# Patient Record
Sex: Female | Born: 1950
Health system: Southern US, Community
[De-identification: ages and names within clinical notes are randomized; demographics above are authoritative.]

## PROBLEM LIST (undated history)

## (undated) DIAGNOSIS — Z8719 Personal history of other diseases of the digestive system: Secondary | ICD-10-CM

## (undated) DIAGNOSIS — E785 Hyperlipidemia, unspecified: Secondary | ICD-10-CM

## (undated) DIAGNOSIS — R162 Hepatomegaly with splenomegaly, not elsewhere classified: Secondary | ICD-10-CM

## (undated) DIAGNOSIS — F32A Depression, unspecified: Secondary | ICD-10-CM

## (undated) DIAGNOSIS — F329 Major depressive disorder, single episode, unspecified: Secondary | ICD-10-CM

## (undated) DIAGNOSIS — K219 Gastro-esophageal reflux disease without esophagitis: Secondary | ICD-10-CM

## (undated) DIAGNOSIS — E119 Type 2 diabetes mellitus without complications: Secondary | ICD-10-CM

## (undated) DIAGNOSIS — K5790 Diverticulosis of intestine, part unspecified, without perforation or abscess without bleeding: Secondary | ICD-10-CM

## (undated) DIAGNOSIS — Z86010 Personal history of colonic polyps: Secondary | ICD-10-CM

## (undated) DIAGNOSIS — F419 Anxiety disorder, unspecified: Secondary | ICD-10-CM

## (undated) DIAGNOSIS — Z9889 Other specified postprocedural states: Secondary | ICD-10-CM

## (undated) DIAGNOSIS — K220 Achalasia of cardia: Secondary | ICD-10-CM

## (undated) HISTORY — DX: Diverticulosis of intestine, part unspecified, without perforation or abscess without bleeding: K57.90

## (undated) HISTORY — DX: Achalasia of cardia: K22.0

## (undated) HISTORY — PX: LAPAROSCOPIC MYOTOMY: SUR779

## (undated) HISTORY — DX: Anxiety disorder, unspecified: F41.9

## (undated) HISTORY — PX: COLONOSCOPY: SHX5424

## (undated) HISTORY — DX: Type 2 diabetes mellitus without complications: E11.9

## (undated) HISTORY — DX: Personal history of other diseases of the digestive system: Z87.19

## (undated) HISTORY — DX: Depression, unspecified: F32.A

## (undated) HISTORY — DX: Hyperlipidemia, unspecified: E78.5

## (undated) HISTORY — DX: Gastro-esophageal reflux disease without esophagitis: K21.9

## (undated) HISTORY — DX: Hepatomegaly with splenomegaly, not elsewhere classified: R16.2

## (undated) HISTORY — DX: Major depressive disorder, single episode, unspecified: F32.9

## (undated) HISTORY — PX: ESOPHAGOGASTRODUODENOSCOPY: SHX1529

## (undated) HISTORY — DX: Personal history of other diseases of the digestive system: Z98.890

---
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
## 2002-12-04 ENCOUNTER — Encounter: Payer: Self-pay | Admitting: Gynecology

## 2002-12-04 ENCOUNTER — Ambulatory Visit (HOSPITAL_COMMUNITY): Admission: RE | Admit: 2002-12-04 | Discharge: 2002-12-04 | Payer: Self-pay | Admitting: Gynecology

## 2003-07-09 ENCOUNTER — Encounter: Admission: RE | Admit: 2003-07-09 | Discharge: 2003-07-09 | Payer: Self-pay | Admitting: Gynecology

## 2003-07-09 ENCOUNTER — Encounter: Payer: Self-pay | Admitting: Gynecology

## 2003-07-27 ENCOUNTER — Encounter: Payer: Self-pay | Admitting: General Surgery

## 2003-07-27 ENCOUNTER — Ambulatory Visit (HOSPITAL_COMMUNITY): Admission: RE | Admit: 2003-07-27 | Discharge: 2003-07-27 | Payer: Self-pay | Admitting: General Surgery

## 2004-02-11 ENCOUNTER — Other Ambulatory Visit: Admission: RE | Admit: 2004-02-11 | Discharge: 2004-02-11 | Payer: Self-pay | Admitting: Gynecology

## 2004-02-23 ENCOUNTER — Encounter: Admission: RE | Admit: 2004-02-23 | Discharge: 2004-02-23 | Payer: Self-pay | Admitting: Gynecology

## 2005-01-03 ENCOUNTER — Ambulatory Visit: Payer: Self-pay | Admitting: Family Medicine

## 2005-02-13 ENCOUNTER — Other Ambulatory Visit: Admission: RE | Admit: 2005-02-13 | Discharge: 2005-02-13 | Payer: Self-pay | Admitting: Gynecology

## 2005-03-20 ENCOUNTER — Encounter: Admission: RE | Admit: 2005-03-20 | Discharge: 2005-03-20 | Payer: Self-pay | Admitting: Gynecology

## 2006-03-05 ENCOUNTER — Other Ambulatory Visit: Admission: RE | Admit: 2006-03-05 | Discharge: 2006-03-05 | Payer: Self-pay | Admitting: Gynecology

## 2006-05-04 ENCOUNTER — Ambulatory Visit: Payer: Self-pay | Admitting: Family Medicine

## 2006-09-24 ENCOUNTER — Ambulatory Visit: Payer: Self-pay | Admitting: Gastroenterology

## 2006-09-24 LAB — CONVERTED CEMR LAB
Basophils Absolute: 0 10*3/uL (ref 0.0–0.1)
Bilirubin Urine: NEGATIVE
HCT: 41.5 % (ref 36.0–46.0)
Hemoglobin, Urine: NEGATIVE
Monocytes Absolute: 0.5 10*3/uL (ref 0.2–0.7)
Monocytes Relative: 7.1 % (ref 3.0–11.0)
Neutro Abs: 4.8 10*3/uL (ref 1.4–7.7)
Nitrite: NEGATIVE
Platelets: 252 10*3/uL (ref 150–400)
RBC: 4.78 M/uL (ref 3.87–5.11)
RDW: 11.8 % (ref 11.5–14.6)
Specific Gravity, Urine: >=1.03 (ref 1.000–1.03)

## 2006-10-04 ENCOUNTER — Encounter (INDEPENDENT_AMBULATORY_CARE_PROVIDER_SITE_OTHER): Payer: Self-pay | Admitting: *Deleted

## 2006-10-04 ENCOUNTER — Ambulatory Visit: Payer: Self-pay | Admitting: Gastroenterology

## 2006-10-04 LAB — CONVERTED CEMR LAB
OCCULT 3: NEGATIVE
OCCULT 4: NEGATIVE

## 2006-11-14 ENCOUNTER — Encounter: Admission: RE | Admit: 2006-11-14 | Discharge: 2006-11-14 | Payer: Self-pay | Admitting: Gynecology

## 2006-12-05 ENCOUNTER — Encounter: Admission: RE | Admit: 2006-12-05 | Discharge: 2006-12-05 | Payer: Self-pay | Admitting: Gynecology

## 2007-03-07 ENCOUNTER — Emergency Department (HOSPITAL_COMMUNITY): Admission: EM | Admit: 2007-03-07 | Discharge: 2007-03-07 | Payer: Self-pay | Admitting: Emergency Medicine

## 2007-03-07 DIAGNOSIS — K5730 Diverticulosis of large intestine without perforation or abscess without bleeding: Secondary | ICD-10-CM | POA: Insufficient documentation

## 2007-03-07 DIAGNOSIS — K219 Gastro-esophageal reflux disease without esophagitis: Secondary | ICD-10-CM | POA: Insufficient documentation

## 2007-03-07 DIAGNOSIS — Z1211 Encounter for screening for malignant neoplasm of colon: Secondary | ICD-10-CM | POA: Insufficient documentation

## 2007-03-07 DIAGNOSIS — Z9889 Other specified postprocedural states: Secondary | ICD-10-CM

## 2007-03-15 ENCOUNTER — Encounter: Admission: RE | Admit: 2007-03-15 | Discharge: 2007-03-15 | Payer: Self-pay | Admitting: Gynecology

## 2007-03-28 ENCOUNTER — Encounter: Admission: RE | Admit: 2007-03-28 | Discharge: 2007-03-28 | Payer: Self-pay | Admitting: Gynecology

## 2007-03-28 ENCOUNTER — Encounter (INDEPENDENT_AMBULATORY_CARE_PROVIDER_SITE_OTHER): Payer: Self-pay | Admitting: Diagnostic Radiology

## 2007-12-05 ENCOUNTER — Encounter: Admission: RE | Admit: 2007-12-05 | Discharge: 2007-12-05 | Payer: Self-pay | Admitting: Gynecology

## 2007-12-12 ENCOUNTER — Telehealth (INDEPENDENT_AMBULATORY_CARE_PROVIDER_SITE_OTHER): Payer: Self-pay | Admitting: *Deleted

## 2007-12-12 ENCOUNTER — Emergency Department (HOSPITAL_COMMUNITY): Admission: EM | Admit: 2007-12-12 | Discharge: 2007-12-12 | Payer: Self-pay | Admitting: Family Medicine

## 2007-12-13 ENCOUNTER — Ambulatory Visit: Payer: Self-pay | Admitting: Family Medicine

## 2007-12-13 DIAGNOSIS — E785 Hyperlipidemia, unspecified: Secondary | ICD-10-CM

## 2007-12-13 DIAGNOSIS — E1169 Type 2 diabetes mellitus with other specified complication: Secondary | ICD-10-CM | POA: Insufficient documentation

## 2007-12-26 ENCOUNTER — Ambulatory Visit: Payer: Self-pay | Admitting: Family Medicine

## 2008-01-07 LAB — CONVERTED CEMR LAB
ALT: 21 units/L (ref 0–35)
AST: 22 units/L (ref 0–37)
Albumin: 3.9 g/dL (ref 3.5–5.2)
Bilirubin, Direct: 0.1 mg/dL (ref 0.0–0.3)
Chloride: 105 meq/L (ref 96–112)
Cholesterol: 170 mg/dL (ref 0–200)
GFR calc Af Amer: 83 mL/min
GFR calc non Af Amer: 69 mL/min
Sodium: 140 meq/L (ref 135–145)
Total Bilirubin: 0.7 mg/dL (ref 0.3–1.2)
Total CHOL/HDL Ratio: 4.9
VLDL: 17 mg/dL (ref 0–40)

## 2008-01-08 ENCOUNTER — Encounter (INDEPENDENT_AMBULATORY_CARE_PROVIDER_SITE_OTHER): Payer: Self-pay | Admitting: *Deleted

## 2008-11-12 ENCOUNTER — Emergency Department (HOSPITAL_COMMUNITY): Admission: EM | Admit: 2008-11-12 | Discharge: 2008-11-12 | Payer: Self-pay | Admitting: Emergency Medicine

## 2008-11-27 ENCOUNTER — Ambulatory Visit: Payer: Self-pay | Admitting: Family Medicine

## 2008-12-08 ENCOUNTER — Encounter: Admission: RE | Admit: 2008-12-08 | Discharge: 2008-12-08 | Payer: Self-pay | Admitting: Family Medicine

## 2008-12-08 LAB — HM MAMMOGRAPHY: HM Mammogram: NORMAL

## 2008-12-24 ENCOUNTER — Encounter (INDEPENDENT_AMBULATORY_CARE_PROVIDER_SITE_OTHER): Payer: Self-pay | Admitting: *Deleted

## 2009-03-05 ENCOUNTER — Ambulatory Visit: Payer: Self-pay | Admitting: Women's Health

## 2009-03-05 ENCOUNTER — Encounter: Payer: Self-pay | Admitting: Women's Health

## 2009-03-05 ENCOUNTER — Other Ambulatory Visit: Admission: RE | Admit: 2009-03-05 | Discharge: 2009-03-05 | Payer: Self-pay | Admitting: Gynecology

## 2009-03-05 ENCOUNTER — Encounter: Payer: Self-pay | Admitting: Family Medicine

## 2009-03-19 ENCOUNTER — Telehealth (INDEPENDENT_AMBULATORY_CARE_PROVIDER_SITE_OTHER): Payer: Self-pay | Admitting: *Deleted

## 2009-03-29 ENCOUNTER — Ambulatory Visit: Payer: Self-pay | Admitting: Family Medicine

## 2009-03-29 LAB — CONVERTED CEMR LAB
ALT: 38 units/L — ABNORMAL HIGH (ref 0–35)
Alkaline Phosphatase: 68 units/L (ref 39–117)
Bilirubin, Direct: 0 mg/dL (ref 0.0–0.3)
HDL: 36.3 mg/dL — ABNORMAL LOW (ref >39.00)
Total Bilirubin: 0.8 mg/dL (ref 0.3–1.2)
Total CHOL/HDL Ratio: 5
VLDL: 25.2 mg/dL (ref 0.0–40.0)

## 2009-03-30 ENCOUNTER — Encounter (INDEPENDENT_AMBULATORY_CARE_PROVIDER_SITE_OTHER): Payer: Self-pay | Admitting: *Deleted

## 2009-08-27 ENCOUNTER — Encounter (INDEPENDENT_AMBULATORY_CARE_PROVIDER_SITE_OTHER): Payer: Self-pay | Admitting: *Deleted

## 2010-03-09 ENCOUNTER — Encounter: Admission: RE | Admit: 2010-03-09 | Discharge: 2010-03-09 | Payer: Self-pay | Admitting: Gynecology

## 2010-04-28 ENCOUNTER — Telehealth: Payer: Self-pay | Admitting: Gastroenterology

## 2010-07-06 ENCOUNTER — Ambulatory Visit: Payer: Self-pay | Admitting: Family Medicine

## 2010-09-13 ENCOUNTER — Encounter: Payer: Self-pay | Admitting: Gastroenterology

## 2010-11-05 ENCOUNTER — Encounter: Payer: Self-pay | Admitting: General Surgery

## 2010-11-06 ENCOUNTER — Encounter: Payer: Self-pay | Admitting: Gynecology

## 2010-11-15 NOTE — Progress Notes (Signed)
Summary: Schedule NP3  Phone Note Outgoing Call Call back at Home Phone [phone]   Call placed by: Harlow Mares CMA Duncan Dull),  April 28, 2010 3:53 PM Call placed to: Patient Summary of Call: patient is out of town until 05/08/2010 i advised her husband i will call back after that date to talk to her but he also said he will talk to her soon and advise her that she is due.  Initial call taken by: Harlow Mares CMA Duncan Dull),  April 28, 2010 3:55 PM  Follow-up for Phone Call        patient is still out of town we will mail her a letter to make sure she is aware she needs an office visit.  Follow-up by: Harlow Mares CMA (AAMA),  May 06, 2010 4:00 PM

## 2010-11-15 NOTE — Letter (Signed)
Summary: Colonoscopy Date Change Letter  [facility]  [address]   [place], [place] [address]   Phone: [phone]  Fax: [phone]      September 13, 2010 MRN: [id number]   INETTA DICKE [address] [place], [place]  [address]   Dear Ms. Scheuermann,   Previously you were recommended to have a repeat colonoscopy around this time. Your chart was recently reviewed by Dr.Kaplan of [facility]. Follow up colonoscopy is now recommended in December 2014. This revised recommendation is based on current, nationally recognized guidelines for colorectal cancer screening and polyp surveillance. These guidelines are endorsed by the American Cancer Society, [employer] on Colorectal Cancer as well as numerous other major medical organizations.  Please understand that our recommendation assumes that you do not have any new symptoms such as bleeding, a change in bowel habits, anemia, or significant abdominal discomfort. If you do have any concerning GI symptoms or want to discuss the guideline recommendations, please call to arrange an office visit at your earliest convenience. Otherwise we will keep you in our reminder system and contact you 1-2 months prior to the date listed above to schedule your next colonoscopy.  Thank you,   Barbette Hair. Arlyce Dice, M.D.  [facility] [phone]

## 2010-11-15 NOTE — Assessment & Plan Note (Signed)
Summary: HEARTBURN/RH........Marland Kitchen   Vital Signs:  Patient profile:   60 year old female Weight:      142.25 pounds (64.66 kg) Temp:     98.0 degrees F (36.67 degrees C) oral BP sitting:   106 / 64  (left arm) Cuff size:   regular  Vitals Entered By: Lucious Groves CMA (July 06, 2010 2:26 PM) CC: C/O heartburn, which she states is a continued issue for her./kb Is Patient Diabetic? No Pain Assessment Patient in pain? no      Comments Patient states that she has had trouble with heartburn for years./kb   History of Present Illness: 60 yo woman here today for continued heartburn.  pt reports sxs are well controlled on Zantac.  worsens w/ diet- especially fried foods.  denies CP, SOB, N/V.    Current Medications (verified): 1)  Zantac 75 75 Mg Tabs (Ranitidine Hcl) .Marland Kitchen.. 1 By Mouth 1 or 2x/day Depending On Sxs  Allergies (verified): No Known Drug Allergies  Past History:  Past Medical History: Last updated: 12/13/2007 GERD Diverticulosis, colon Hyperlipidemia  Social History: married daughter (1997)  Review of Systems      See HPI  Physical Exam  General:  Well-developed,well-nourished,in no acute distress; alert,appropriate and cooperative throughout examination Lungs:  Normal respiratory effort, chest expands symmetrically. Lungs are clear to auscultation, no crackles or wheezes. Heart:  Normal rate and regular rhythm. S1 and S2 normal without gallop, murmur, click, rub or other extra sounds. Abdomen:  soft, NT/ND, +BS   Impression & Recommendations:  Problem # 1:  GERD (ICD-530.81) Assessment Unchanged pt reports sxs well controlled on Zantac.  no need to increase to PPI.  reviewed lifestyle modifications. Her updated medication list for this problem includes:    Zantac 75 75 Mg Tabs (Ranitidine hcl) .Marland Kitchen... 1 by mouth 1 or 2x/day depending on sxs  Complete Medication List: 1)  Zantac 75 75 Mg Tabs (Ranitidine hcl) .Marland Kitchen.. 1 by mouth 1 or 2x/day depending on  sxs  Patient Instructions: 1)  Take the Zantac as directed 2)  Watch the foods that trigger your reflux 3)  Don't lie down right after eating 4)  Call with any questions or concerns 5)  Have a great fall! Prescriptions: ZANTAC 75 75 MG TABS (RANITIDINE HCL) 1 by mouth 1 or 2x/day depending on sxs  #60 x 6   Entered and Authorized by:   Neena Rhymes MD   Signed by:   Neena Rhymes MD on 07/06/2010   Method used:   Electronically to        [facility] [phone]* (retail)       [address]       [place], [place]  [address]       Ph: [phone]       Fax: [phone]   RxID:   [id number]

## 2010-12-21 ENCOUNTER — Ambulatory Visit (INDEPENDENT_AMBULATORY_CARE_PROVIDER_SITE_OTHER): Payer: BC Managed Care – PPO | Admitting: Family Medicine

## 2010-12-21 ENCOUNTER — Other Ambulatory Visit: Payer: Self-pay | Admitting: Family Medicine

## 2010-12-21 ENCOUNTER — Encounter: Payer: Self-pay | Admitting: Family Medicine

## 2010-12-21 DIAGNOSIS — Z0000 Encounter for general adult medical examination without abnormal findings: Secondary | ICD-10-CM

## 2010-12-21 DIAGNOSIS — E785 Hyperlipidemia, unspecified: Secondary | ICD-10-CM

## 2010-12-21 DIAGNOSIS — R358 Other polyuria: Secondary | ICD-10-CM

## 2010-12-21 DIAGNOSIS — R3589 Other polyuria: Secondary | ICD-10-CM | POA: Insufficient documentation

## 2010-12-21 LAB — HEPATIC FUNCTION PANEL
ALT: 48 U/L — ABNORMAL HIGH (ref 0–35)
AST: 35 U/L (ref 0–37)
Albumin: 4.2 g/dL (ref 3.5–5.2)
Alkaline Phosphatase: 74 U/L (ref 39–117)
Bilirubin, Direct: 0.1 mg/dL (ref 0.0–0.3)
Total Bilirubin: 0.5 mg/dL (ref 0.3–1.2)

## 2010-12-21 LAB — CONVERTED CEMR LAB
Blood in Urine, dipstick: NEGATIVE
Ketones, urine, test strip: NEGATIVE
Protein, U semiquant: 30
Specific Gravity, Urine: 1.02
Urobilinogen, UA: NEGATIVE

## 2010-12-21 LAB — CBC WITH DIFFERENTIAL/PLATELET
Hemoglobin: 13.9 g/dL (ref 12.0–15.0)
Lymphocytes Relative: 33.3 % (ref 12.0–46.0)
Lymphs Abs: 1.9 10*3/uL (ref 0.7–4.0)
Neutrophils Relative %: 57.1 % (ref 43.0–77.0)
Platelets: 219 10*3/uL (ref 150.0–400.0)
RBC: 4.58 Mil/uL (ref 3.87–5.11)
WBC: 5.6 10*3/uL (ref 4.5–10.5)

## 2010-12-21 LAB — LIPID PANEL
HDL: 30.8 mg/dL — ABNORMAL LOW (ref >39.00)
Triglycerides: 138 mg/dL (ref 0.0–149.0)

## 2010-12-21 LAB — BASIC METABOLIC PANEL
Calcium: 9.7 mg/dL (ref 8.4–10.5)
Chloride: 107 mEq/L (ref 96–112)
Sodium: 141 mEq/L (ref 135–145)

## 2010-12-22 LAB — TSH: TSH: 1.59 u[IU]/mL (ref 0.35–5.50)

## 2010-12-22 LAB — CONVERTED CEMR LAB: Vit D, 25-Hydroxy: 21 ng/mL — ABNORMAL LOW (ref 30–89)

## 2010-12-27 NOTE — Assessment & Plan Note (Signed)
Summary: discuss blood sugar and cholesterol///sph   Vital Signs:  Patient profile:   60 year old female Weight:      142.13 pounds (64.60 kg) Temp:     97.6 degrees F (36.44 degrees C) oral BP sitting:   100 / 70  (right arm) Cuff size:   regular  Vitals Entered By: Lucious Groves CMA (December 21, 2010 11:08 AM) CC: Discuss blood sugar and cholesterol./kb Is Patient Diabetic? No Pain Assessment Patient in pain? no      Comments Patient notes that she has been having increased urinary frequency and hunger. Patient denies dizziness. She is unsure about sweating due to hot flashes.   History of Present Illness: 60 yo woman here today for 'i want to check my sugar'.  reports increased urinary frequency, increased hunger.  saw commercial on TV that concerned her for diabetes.  reports previous sugars have been 'borderline'.  reports she has 'never' had a physical.  no CP, SOB, HAs, visual changes, edema.  reports that she has stopped eating sweets 10 days ago.    Current Medications (verified): 1)  Zantac 75 75 Mg Tabs (Ranitidine Hcl) .Marland Kitchen.. 1 By Mouth 1 or 2x/day Depending On Sxs  Allergies (verified): No Known Drug Allergies  Review of Systems      See HPI  Physical Exam  General:  Well-developed,well-nourished,in no acute distress; alert,appropriate and cooperative throughout examination Neck:  No deformities, masses, or tenderness noted. Lungs:  Normal respiratory effort, chest expands symmetrically. Lungs are clear to auscultation, no crackles or wheezes. Heart:  Normal rate and regular rhythm. S1 and S2 normal without gallop, murmur, click, rub or other extra sounds. Pulses:  +2 carotid, radial, DP Extremities:  no C/C/E   Impression & Recommendations:  Problem # 1:  POLYURIA (ONG-295.28) Assessment New will check UA and metabolic panel to r/o UTI, DM.  has not had complete physical- will schedule. Orders: Venipuncture (41324) TLB-BMP (Basic Metabolic Panel-BMET)  (80048-METABOL) TLB-TSH (Thyroid Stimulating Hormone) (84443-TSH) Specimen Handling (40102) UA Dipstick W/ Micro (manual) (81000)  Complete Medication List: 1)  Zantac 75 75 Mg Tabs (Ranitidine hcl) .Marland Kitchen.. 1 by mouth 1 or 2x/day depending on sxs  Other Orders: TLB-Lipid Panel (80061-LIPID) TLB-Hepatic/Liver Function Pnl (80076-HEPATIC) TLB-CBC Platelet - w/Differential (85025-CBCD) T-Vitamin D (25-Hydroxy) (72536-64403)  Patient Instructions: 1)  Schedule your complete physical at your convenience- you can eat before this 2)  We'll notify you of your lab results 3)  Call with any questions or concerns 4)  Hang in there!!!   Orders Added: 1)  Venipuncture [36415] 2)  TLB-BMP (Basic Metabolic Panel-BMET) [80048-METABOL] 3)  TLB-TSH (Thyroid Stimulating Hormone) [84443-TSH] 4)  TLB-Lipid Panel [80061-LIPID] 5)  TLB-Hepatic/Liver Function Pnl [80076-HEPATIC] 6)  TLB-CBC Platelet - w/Differential [85025-CBCD] 7)  T-Vitamin D (25-Hydroxy) [47425-95638] 8)  Specimen Handling [99000] 9)  UA Dipstick W/ Micro (manual) [81000] 10)  Est. Patient Level III [75643]    Laboratory Results   Urine Tests   Date/Time Reported: December 21, 2010 11:51 AM   Routine Urinalysis   Color: yellow Appearance: Clear Glucose: negative   (Normal Range: Negative) Bilirubin: negative   (Normal Range: Negative) Ketone: negative   (Normal Range: Negative) Spec. Gravity: 1.020   (Normal Range: 1.003-1.035) Blood: negative   (Normal Range: Negative) pH: 5.0   (Normal Range: 5.0-8.0) Protein: 30   (Normal Range: Negative) Urobilinogen: negative   (Normal Range: 0-1) Nitrite: negative   (Normal Range: Negative) Leukocyte Esterace: negative   (Normal Range: Negative)  Comments: Floydene Flock  December 21, 2010 11:52 AM

## 2011-01-02 ENCOUNTER — Telehealth: Payer: Self-pay | Admitting: Gastroenterology

## 2011-01-09 ENCOUNTER — Telehealth: Payer: Self-pay | Admitting: Gastroenterology

## 2011-01-09 NOTE — Telephone Encounter (Signed)
I can't find where she has been seen in the last few years.  Please make an office app't.

## 2011-01-09 NOTE — Telephone Encounter (Signed)
Patient states that she called last week and was told to take Prilosec for reflux. She states it has not helped her and her heartburn is really bad. States that the acid in her throat is making her have a "cigarette smoke" taste in her mouth. Patient is worried and would like to have an EGD done. Dr. Arlyce Dice please advise.

## 2011-01-09 NOTE — Telephone Encounter (Signed)
Scheduled pt to see Dr. Arlyce Dice 03/01/11@8 :45am. Pt aware of appt date and time.

## 2011-01-10 ENCOUNTER — Encounter: Payer: Self-pay | Admitting: Family Medicine

## 2011-01-12 ENCOUNTER — Ambulatory Visit: Payer: BC Managed Care – PPO | Admitting: Family Medicine

## 2011-01-12 NOTE — Progress Notes (Signed)
Summary: triage  Phone Note Call from Patient Call back at Home Phone [phone]   Caller: Patient Call For: Dr. Arlyce Dice Reason for Call: Talk to Nurse Summary of Call: Patient is having heartburn and reflux, wants EGD but told her she needs to see Arlyce Dice first but his first available is in May and she does not want to wait, wants to speak with nurse Initial call taken by: [place] Johnson,  January 02, 2011 8:03 AM  Follow-up for Phone Call        Patient is calling stating she is having problems with heartburn and reflux. Patient would like to have an EGD. Patient wants to know if she needs a visit or if she can have a direct EGD. Dr. Arlyce Dice please advise.  Additional Follow-up for Phone Call Additional follow up Details #1::        Place her on omeprazole.  c/b 2 weeks; if no better schedule EGD Additional Follow-up by: Louis Meckel MD,  January 02, 2011 10:56 AM    Additional Follow-up for Phone Call Additional follow up Details #2::    Patient states that she started taking Prilosec yesterday. Patient instructed to take Omeprazole for 2 weeks and to call us back. If not better at that point we will schedule an EGD. Patient verbalized understanding. Follow-up by: Selinda Michaels RN,  January 02, 2011 11:02 AM

## 2011-01-12 NOTE — Procedures (Signed)
Summary: ENDO  Patient Name: [name], [name] MRN:  Procedure Procedures: Panendoscopy (EGD) CPT: 43235.  Personnel: Endoscopist: Barbette Hair. Arlyce Dice, MD.  Indications  Surveillance of: Achalasia.  History  Current Medications: Patient is not currently taking Coumadin.  Comments: Patient history reviewed and updated, pre-procedure physical performed prior to initiation of sedation? Pre-Exam Physical: Performed Oct 04, 2006  Cardio-pulmonary exam, HEENT exam, Abdominal exam, Mental status exam WNL.  Comments: Patient history reviewed and updated, pre-procedure physical performed prior to initiation of sedation? Exam Exam Info: Maximum depth of insertion Duodenum, intended Duodenum. Vocal cords visualized. Gastric retroflexion performed. ASA Classification: II. Tolerance: good.  Sedation Meds: Robinul 0.2 given IV. Fentanyl 100 mcg. given IV. Versed 9 mg. given IV. Cetacaine Spray 2 sprays given aerosolized. Mylicon 1 cc Benadryl 25 given IV.  Monitoring: BP and pulse monitoring done. Oximetry used. Supplemental O2 given at 2 Liters.  Findings - OTHER FINDING: Comments: Tortuous esophagus with moderate dilitation.  Distal esophagus is stenotic but the scope easily passes through this area.  - Normal: Fundus to Duodenal 2nd Portion.  - Normal: Distal Esophagus to Cardia.   Assessment Abnormal examination, see findings above.  Comments: Findings c/w achalasia Events  Unplanned Intervention: No unplanned interventions were required.  Unplanned Events: There were no complications. Plans Medication(s): No medications are required.  Disposition: After procedure patient sent to recovery. After recovery patient sent home.  Scheduling: Follow-up prn. EGD, to [name] Maduro D. Arlyce Dice, MD, around Oct 04, 2009.  CC:  The patient This report was created from the original endoscopy report, which was reviewed and signed by the above listed endoscopist.     [facility] Reports-CCC]

## 2011-01-30 LAB — CBC
HCT: 43.1 % (ref 36.0–46.0)
Hemoglobin: 14.6 g/dL (ref 12.0–15.0)
MCV: 86.8 fL (ref 78.0–100.0)
Platelets: 220 10*3/uL (ref 150–400)
RBC: 4.96 MIL/uL (ref 3.87–5.11)
RDW: 12.7 % (ref 11.5–15.5)

## 2011-01-30 LAB — DIFFERENTIAL
Basophils Absolute: 0 10*3/uL (ref 0.0–0.1)
Eosinophils Absolute: 0.1 10*3/uL (ref 0.0–0.7)
Lymphs Abs: 1.3 10*3/uL (ref 0.7–4.0)
Monocytes Absolute: 0.4 10*3/uL (ref 0.1–1.0)
Monocytes Relative: 8 % (ref 3–12)

## 2011-01-30 LAB — POCT CARDIAC MARKERS
CKMB, poc: <1 ng/mL — ABNORMAL LOW (ref 1.0–8.0)
Myoglobin, poc: 51 ng/mL (ref 12–200)
Troponin i, poc: <0.05 ng/mL (ref 0.00–0.09)
Troponin i, poc: <0.05 ng/mL (ref 0.00–0.09)

## 2011-01-30 LAB — POCT I-STAT, CHEM 8
Chloride: 103 mEq/L (ref 96–112)
Creatinine, Ser: 0.9 mg/dL (ref 0.4–1.2)
Glucose, Bld: 109 mg/dL — ABNORMAL HIGH (ref 70–99)

## 2011-02-01 ENCOUNTER — Encounter: Payer: Self-pay | Admitting: Gastroenterology

## 2011-02-01 ENCOUNTER — Ambulatory Visit (INDEPENDENT_AMBULATORY_CARE_PROVIDER_SITE_OTHER): Payer: BC Managed Care – PPO | Admitting: Gastroenterology

## 2011-02-01 VITALS — BP 114/60 | HR 80 | Ht 65.0 in | Wt 142.2 lb

## 2011-02-01 DIAGNOSIS — K219 Gastro-esophageal reflux disease without esophagitis: Secondary | ICD-10-CM

## 2011-02-01 NOTE — Assessment & Plan Note (Addendum)
Symptoms may be a manifestation of GERD. The patient is very concerned about esophageal cancer, and I reassured her that this is not the symptom that would be associated with esophageal cancer (no obstructive symptoms.)  Current medications. #1 trial of Nexium 40 mg daily. #2 upper endoscopy (at the patient's insistence)

## 2011-02-01 NOTE — Progress Notes (Signed)
History of Present Illness:  [name] [name] is a 60 year old white female with a history of achalasia here for evaluation of dysguesia. She describes sensation as if she were smoking cigarettes although she does not smoke. She is unsure if she has a burning type sensation in her chest and mouth. She takes ranitidine regularly.  Symptoms partially improved when she took a short course of omeprazole. She status post myotomy for achalasia. She always has some degree of dysphagia but this has not worsened. She has not taken antibiotics.    Review of Systems: Pertinent positive and negative review of systems were noted in the above HPI section. All other review of systems were otherwise negative.    Current Medications, Allergies, Past Medical History, Past Surgical History, Family History and Social History were reviewed in [employer] electronic medical record  Vital signs were reviewed in today's medical record. Physical Exam: General: Well developed , well nourished, no acute distress Head: Normocephalic and atraumatic Eyes:  sclerae anicteric, EOMI Ears: Normal auditory acuity Mouth: No deformity or lesions Lungs: Clear throughout to auscultation Heart: Regular rate and rhythm; no murmurs, rubs or bruits Abdomen: Soft, non tender and non distended. No masses, hepatosplenomegaly or hernias noted. Normal Bowel sounds Rectal:deferred Musculoskeletal: Symmetrical with no gross deformities  Pulses:  Normal pulses noted Extremities: No clubbing, cyanosis, edema or deformities noted Neurological: Alert oriented x 4, grossly nonfocal Psychological:  Alert and cooperative. Normal mood and affect

## 2011-02-01 NOTE — Patient Instructions (Signed)
Upper GI Endoscopy Upper GI endoscopy means using a flexible scope to look at the esophagus, stomach and upper small bowel. This is done to make a diagnosis in people with heartburn, abdominal pain, or abnormal bleeding. Sometimes an endoscope is needed to remove foreign bodies or food that become stuck in the esophagus; it can also be used to take biopsy samples. For the best results, do not eat or drink for 8 hours before having your upper endoscopy.  To perform the endoscopy, you will probably be sedated and your throat will be numbed with a special spray. The endoscope is then slowly passed down your throat (this will not interfere with your breathing). An endoscopy exam takes 15-30 minutes to complete and there is no real pain. Patients rarely remember much about the procedure. The results of the test may take several days if a biopsy or other test is taken.  You may have a sore throat after an endoscopy exam. Serious complications are very rare. Stick to liquids and soft foods until your pain is better. You should not drive a car or operate any dangerous equipment for at least 24 hours after being sedated. SEEK IMMEDIATE MEDICAL CARE IF:  You have severe throat pain.   You have shortness of breath.   You have bleeding problems.   You have a fever.   You have difficulty recovering from your sedation.  Document Released: 11/09/2004 Document Re-Released: 12/27/2009 [facility] Patient Information 2011 [place], [place]. Your EGD is scheduled on 02/03/2011 at 8am

## 2011-02-02 ENCOUNTER — Encounter: Payer: Self-pay | Admitting: Gastroenterology

## 2011-02-03 ENCOUNTER — Encounter: Payer: Self-pay | Admitting: Gastroenterology

## 2011-02-03 ENCOUNTER — Ambulatory Visit (AMBULATORY_SURGERY_CENTER): Payer: BC Managed Care – PPO | Admitting: Gastroenterology

## 2011-02-03 VITALS — BP 141/73 | HR 88 | Temp 98.7°F | Resp 19 | Ht 60.0 in | Wt 142.0 lb

## 2011-02-03 DIAGNOSIS — K220 Achalasia of cardia: Secondary | ICD-10-CM

## 2011-02-03 DIAGNOSIS — K2990 Gastroduodenitis, unspecified, without bleeding: Secondary | ICD-10-CM

## 2011-02-03 DIAGNOSIS — K219 Gastro-esophageal reflux disease without esophagitis: Secondary | ICD-10-CM

## 2011-02-03 DIAGNOSIS — K2900 Acute gastritis without bleeding: Secondary | ICD-10-CM

## 2011-02-03 DIAGNOSIS — K2970 Gastritis, unspecified, without bleeding: Secondary | ICD-10-CM

## 2011-02-03 DIAGNOSIS — K2940 Chronic atrophic gastritis without bleeding: Secondary | ICD-10-CM

## 2011-02-03 MED ORDER — SODIUM CHLORIDE 0.9 % IV SOLN: 500.0000 mL | INTRAVENOUS | Status: DC

## 2011-02-03 NOTE — Progress Notes (Signed)
PATIENT IS VERY ANXIOUS ABOUT SEDATION--STATES WANTS LIGHT SEDATION BUT SHE DOESN'T WANT TO HEAR MD TALKING WITH THE PROCEDURE. PATIENT ALSO DID NOT INITIAL FOR THE DILATATION OF THE ESOPHAGUS, STATES SHE DOESN'T WANT THAT DONE PLEASE

## 2011-02-03 NOTE — Patient Instructions (Signed)
Discharged instructions given with verbal understanding. Handout on Gastritis given. Resume previous medications.

## 2011-02-06 ENCOUNTER — Telehealth: Payer: Self-pay | Admitting: *Deleted

## 2011-02-06 NOTE — Telephone Encounter (Signed)

## 2011-03-01 ENCOUNTER — Ambulatory Visit: Payer: BC Managed Care – PPO | Admitting: Gastroenterology

## 2011-03-03 NOTE — Assessment & Plan Note (Signed)
[facility]                         GASTROENTEROLOGY OFFICE NOTE   NAME:LEVINJanequa, Kipnis                          MRN:          [id number]  DATE:09/24/2006                            DOB:          12-Nov-1950    Questionable melena.   Mrs. Pagliarulo has returned for reevaluation.  On several occasions, she has  had black stools.  She denies hematochezia or abdominal pain.  She is on  no gastric irritants including nonsteroidals.  She has a history of  achalasia for which she underwent a myotomy over 2 years ago.  She had a  screening colonoscopy in 2004 that was remarkable for diverticulosis  only.   PAST MEDICAL HISTORY:  Otherwise unremarkable.   She is on no medications.  She has no allergies.   REVIEW OF SYSTEMS:  Positive for occasional pyrosis and mild dysphagia  which has greatly improved since her surgery.   EXAM:  Pulse 84, blood pressure 110/72, weight 134.  HEENT:  EOMI. PERRLA. Sclerae are anicteric.  Conjunctivae are pink.  NECK:  Supple without thyromegaly, adenopathy or carotid bruits.  CHEST:  Clear to auscultation and percussion without adventitious  sounds.  CARDIAC:  Regular rhythm; normal S1 S2.  There are no murmurs, gallops  or rubs.  ABDOMEN:  Bowel sounds are normoactive.  Abdomen is soft, non-tender and  non-distended.  There are no abdominal masses, tenderness, splenic  enlargement or hepatomegaly.  EXTREMITIES:  Full range of motion.  No cyanosis, clubbing or edema.  RECTAL:  There are no masses.  Stool is Hemoccult negative.   IMPRESSION:  1. Questionable history of bleeding stools.  2. Achalasia - status post laparoscopic myotomy.   RECOMMENDATIONS:  1. Serial Hemoccults.  2. CBC.  3. Upper endoscopy for surveillance due to her increased risk of      esophageal CA with achalasia.     Barbette Hair. Arlyce Dice, MD,FACG  Electronically Signed    RDK/MedQ  DD: 09/24/2006  DT: 09/24/2006  Job #: [id number]

## 2011-03-06 ENCOUNTER — Telehealth: Payer: Self-pay | Admitting: Family Medicine

## 2011-03-06 ENCOUNTER — Emergency Department (HOSPITAL_BASED_OUTPATIENT_CLINIC_OR_DEPARTMENT_OTHER)
Admission: EM | Admit: 2011-03-06 | Discharge: 2011-03-06 | Disposition: A | Discharge status: Home / Self Care | Payer: BC Managed Care – PPO | Attending: Emergency Medicine | Admitting: Emergency Medicine

## 2011-03-06 DIAGNOSIS — R002 Palpitations: Secondary | ICD-10-CM | POA: Insufficient documentation

## 2011-03-06 LAB — BASIC METABOLIC PANEL
Calcium: 10 mg/dL (ref 8.4–10.5)
GFR calc Af Amer: >60 mL/min (ref >60)
GFR calc non Af Amer: >60 mL/min (ref >60)
Glucose, Bld: 112 mg/dL — ABNORMAL HIGH (ref 70–99)
Potassium: 4.7 mEq/L (ref 3.5–5.1)
Sodium: 138 mEq/L (ref 135–145)

## 2011-03-06 LAB — CBC
HCT: 42.4 % (ref 36.0–46.0)
MCV: 82.3 fL (ref 78.0–100.0)
Platelets: 219 10*3/uL (ref 150–400)
RBC: 5.15 MIL/uL — ABNORMAL HIGH (ref 3.87–5.11)
RDW: 12.7 % (ref 11.5–15.5)
WBC: 7 10*3/uL (ref 4.0–10.5)

## 2011-03-06 LAB — DIFFERENTIAL
Basophils Absolute: 0 10*3/uL (ref 0.0–0.1)
Eosinophils Absolute: 0.2 10*3/uL (ref 0.0–0.7)
Eosinophils Relative: 2 % (ref 0–5)
Lymphocytes Relative: 30 % (ref 12–46)
Lymphs Abs: 2.1 10*3/uL (ref 0.7–4.0)
Neutrophils Relative %: 60 % (ref 43–77)

## 2011-03-10 ENCOUNTER — Ambulatory Visit: Payer: BC Managed Care – PPO | Admitting: Family Medicine

## 2011-03-16 ENCOUNTER — Ambulatory Visit: Payer: BC Managed Care – PPO | Admitting: Gastroenterology

## 2011-03-30 NOTE — Telephone Encounter (Signed)
error 

## 2011-08-30 ENCOUNTER — Ambulatory Visit: Payer: BC Managed Care – PPO | Admitting: Family Medicine

## 2011-09-15 ENCOUNTER — Ambulatory Visit (INDEPENDENT_AMBULATORY_CARE_PROVIDER_SITE_OTHER): Payer: BC Managed Care – PPO | Admitting: Psychology

## 2011-09-15 DIAGNOSIS — F411 Generalized anxiety disorder: Secondary | ICD-10-CM

## 2011-10-06 ENCOUNTER — Ambulatory Visit: Payer: BC Managed Care – PPO | Admitting: Psychology

## 2011-10-12 ENCOUNTER — Other Ambulatory Visit: Payer: Self-pay | Admitting: Gynecology

## 2011-10-12 DIAGNOSIS — Z1231 Encounter for screening mammogram for malignant neoplasm of breast: Secondary | ICD-10-CM

## 2011-10-18 ENCOUNTER — Telehealth: Payer: Self-pay | Admitting: Gastroenterology

## 2011-10-18 NOTE — Telephone Encounter (Signed)
Pt states she is still having problems with indigestion and a "smoke taste" in her mouth. Pt requests an appt. Pt scheduled to see Dr. Arlyce Dice 10/24/11@10am . Pt aware of appt date and time.

## 2011-10-19 ENCOUNTER — Ambulatory Visit (INDEPENDENT_AMBULATORY_CARE_PROVIDER_SITE_OTHER): Payer: BC Managed Care – PPO | Admitting: Family Medicine

## 2011-10-19 ENCOUNTER — Encounter: Payer: Self-pay | Admitting: Family Medicine

## 2011-10-19 DIAGNOSIS — K219 Gastro-esophageal reflux disease without esophagitis: Secondary | ICD-10-CM

## 2011-10-19 DIAGNOSIS — E559 Vitamin D deficiency, unspecified: Secondary | ICD-10-CM | POA: Insufficient documentation

## 2011-10-19 LAB — CBC WITH DIFFERENTIAL/PLATELET
Basophils Relative: 0.1 % (ref 0.0–3.0)
Eosinophils Relative: 1.5 % (ref 0.0–5.0)
Hemoglobin: 15 g/dL (ref 12.0–15.0)
Lymphocytes Relative: 17.6 % (ref 12.0–46.0)
Monocytes Relative: 4.8 % (ref 3.0–12.0)
Neutro Abs: 6.4 10*3/uL (ref 1.4–7.7)
Neutrophils Relative %: 76 % (ref 43.0–77.0)
RBC: 5.05 Mil/uL (ref 3.87–5.11)
WBC: 8.4 10*3/uL (ref 4.5–10.5)

## 2011-10-19 LAB — LIPID PANEL: VLDL: 42 mg/dL — ABNORMAL HIGH (ref 0.0–40.0)

## 2011-10-19 LAB — HEPATIC FUNCTION PANEL
ALT: 94 U/L — ABNORMAL HIGH (ref 0–35)
AST: 72 U/L — ABNORMAL HIGH (ref 0–37)
Albumin: 4.3 g/dL (ref 3.5–5.2)
Total Bilirubin: 0.7 mg/dL (ref 0.3–1.2)
Total Protein: 7.7 g/dL (ref 6.0–8.3)

## 2011-10-19 LAB — LDL CHOLESTEROL, DIRECT: Direct LDL: 119.8 mg/dL

## 2011-10-19 LAB — BASIC METABOLIC PANEL
BUN: 15 mg/dL (ref 6–23)
CO2: 28 mEq/L (ref 19–32)
Chloride: 102 mEq/L (ref 96–112)
Creatinine, Ser: 1 mg/dL (ref 0.4–1.2)
Glucose, Bld: 111 mg/dL — ABNORMAL HIGH (ref 70–99)
Potassium: 4.4 mEq/L (ref 3.5–5.1)

## 2011-10-19 LAB — TSH: TSH: 1.37 u[IU]/mL (ref 0.35–5.50)

## 2011-10-19 NOTE — Progress Notes (Signed)
  Subjective:    Patient ID: [name] [name], female    DOB: Sep 11, 1951, 61 y.o.   MRN: [id number]  HPI GERD- had endoscopy in April w/ inflammation present.  Has had 'smokey taste' in mouth.  This has improved previously from Prilosec.  No new medicines.  Has appt upcoming w/ Dr Arlyce Dice.  Denies ETOH.  Admits to eating spicy food recently and Sushi.  Denies PND.  Review of Systems     Objective:   Physical Exam  Vitals reviewed. Constitutional: She is oriented to person, place, and time. She appears well-developed and well-nourished. No distress.  HENT:  Head: Normocephalic and atraumatic.  Nose: Nose normal.  Mouth/Throat: Oropharynx is clear and moist. No oropharyngeal exudate.  Eyes: Conjunctivae and EOM are normal. Pupils are equal, round, and reactive to light.  Neck: Normal range of motion. Neck supple. No thyromegaly present.  Cardiovascular: Normal rate, regular rhythm, normal heart sounds and intact distal pulses.   No murmur heard. Pulmonary/Chest: Effort normal and breath sounds normal. No respiratory distress.  Abdominal: Soft. She exhibits no distension. There is no tenderness. There is no rebound and no guarding.  Lymphadenopathy:    She has no cervical adenopathy.  Neurological: She is alert and oriented to person, place, and time.  Skin: Skin is warm and dry.  Psychiatric: She has a normal mood and affect. Her behavior is normal.          Assessment & Plan:

## 2011-10-19 NOTE — Patient Instructions (Signed)
Please schedule your complete physical at your convenience We'll notify you of your lab results Start the Nexium daily Try and limit your intake of spicy food Call with any questions or concerns Hang in there!

## 2011-10-20 ENCOUNTER — Telehealth: Payer: Self-pay | Admitting: Family Medicine

## 2011-10-20 NOTE — Telephone Encounter (Signed)
Patient would like a lower dose of Nexium

## 2011-10-20 NOTE — Telephone Encounter (Signed)
Called pt to advise the nexium does not come in another dosage, pt asked if nexium and prilosec are the same thing, I told pt that they are 2 different medications, I started to offer another medication in lower dosage however pt said ok nevermind thank you and hung up the phone

## 2011-10-21 LAB — VITAMIN D 1,25 DIHYDROXY
Vitamin D2 1, 25 (OH)2: <8 pg/mL
Vitamin D3 1, 25 (OH)2: 102 pg/mL

## 2011-10-22 NOTE — Telephone Encounter (Signed)
Noted  

## 2011-10-23 ENCOUNTER — Ambulatory Visit: Payer: BC Managed Care – PPO | Admitting: Licensed Clinical Social Worker

## 2011-10-24 ENCOUNTER — Encounter: Payer: Self-pay | Admitting: Gastroenterology

## 2011-10-24 ENCOUNTER — Ambulatory Visit (INDEPENDENT_AMBULATORY_CARE_PROVIDER_SITE_OTHER): Payer: BC Managed Care – PPO | Admitting: Gastroenterology

## 2011-10-24 DIAGNOSIS — K219 Gastro-esophageal reflux disease without esophagitis: Secondary | ICD-10-CM

## 2011-10-24 DIAGNOSIS — K220 Achalasia of cardia: Secondary | ICD-10-CM

## 2011-10-24 NOTE — Patient Instructions (Signed)
Call back as needed 

## 2011-10-24 NOTE — Progress Notes (Signed)
History of Present Illness:  [name] [name] has returned because of recurrent pyrosis. This became severe and she was started on Nexium about a week ago with marked improvement of her symptoms. She has known achalasia and is status post myotomy. 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 She denies dysphagia.   Review of Systems: Pertinent positive and negative review of systems were noted in the above HPI section. All other review of systems were otherwise negative.    Current Medications, Allergies, Past Medical History, Past Surgical History, Family History and Social History were reviewed in [employer] electronic medical record  Vital signs were reviewed in today's medical record. Physical Exam: General: Well developed , well nourished, no acute distress

## 2011-10-24 NOTE — Assessment & Plan Note (Signed)
Recent exacerbation of symptoms that has responded to Nexium.  Recommendations #1 continue PPI therapy and add antacids as needed

## 2011-10-25 ENCOUNTER — Ambulatory Visit (INDEPENDENT_AMBULATORY_CARE_PROVIDER_SITE_OTHER): Payer: BC Managed Care – PPO | Admitting: Women's Health

## 2011-10-25 ENCOUNTER — Other Ambulatory Visit (HOSPITAL_COMMUNITY)
Admission: RE | Admit: 2011-10-25 | Discharge: 2011-10-25 | Disposition: A | Discharge status: Home / Self Care | Payer: BC Managed Care – PPO | Source: Ambulatory Visit | Attending: Obstetrics and Gynecology | Admitting: Obstetrics and Gynecology

## 2011-10-25 ENCOUNTER — Encounter: Payer: Self-pay | Admitting: Women's Health

## 2011-10-25 VITALS — BP 120/70 | Ht 60.25 in | Wt 142.0 lb

## 2011-10-25 DIAGNOSIS — Z01419 Encounter for gynecological examination (general) (routine) without abnormal findings: Secondary | ICD-10-CM

## 2011-10-25 NOTE — Progress Notes (Signed)
[name] [name] [birth year] [id number]    History:    The patient presents for annual exam.  Postmenopausal with no bleeding and no HRT. Biggest complaints today is heartburn and reflux, has seen her primary care for labs and medications. Has had problems with this off and on since 05 and is currently on Nexium. History of normal Paps. History of a benign breast biopsy in 08  has  mammogram next week. Requesting to get breast clip/marker removed.   Past medical history, past surgical history, family history and social history were all reviewed and documented in the EPIC chart. Negative colonoscopy about 5 years ago, declines/refuses DEXA or zostovac. Nurse.   ROS:  A  ROS was performed and pertinent positives and negatives are included in the history.  Exam:  Filed Vitals:   10/25/11 1123  BP: 120/70    General appearance:  Normal Head/Neck:  Normal, without cervical or supraclavicular adenopathy. Thyroid:  Symmetrical, normal in size, without palpable masses or nodularity. Respiratory  Effort:  Normal  Auscultation:  Clear without wheezing or rhonchi Cardiovascular  Auscultation:  Regular rate, without rubs, murmurs or gallops  Edema/varicosities:  Not grossly evident Abdominal  Soft,nontender, without masses, guarding or rebound.  Liver/spleen:  No organomegaly noted  Hernia:  None appreciated  Skin  Inspection:  Grossly normal  Palpation:  Grossly normal Neurologic/psychiatric  Orientation:  Normal with appropriate conversation.  Mood/affect:  Normal  Genitourinary    Breasts: Examined lying and sitting.     Right: Without masses, retractions, discharge or axillary adenopathy.     Left: Without masses, retractions, discharge or axillary adenopathy.   Inguinal/mons:  Normal without inguinal adenopathy  External genitalia:  Normal  BUS/Urethra/Skene's glands:  Normal  Bladder:  Normal  Vagina:  Normal  Cervix:  Normal  Uterus:   normal in size, shape and contour.  Midline  and mobile  Adnexa/parametria:     Rt: Without masses or tenderness.   Lt: Without masses or tenderness.  Anus and perineum: Normal  Digital rectal exam: Normal sphincter tone without palpated masses or tenderness  Assessment/Plan:  61 y.o. MWF G2 P2 for annual exam.   Normal postmenopausal exam Heartburn/reflux-primary care  Plan: SBEs, annual mammogram, calcium rich diet, vitamin D  2000 daily encouraged. Reviewed bone density, declines. Discussed fall prevention and home safety. Discussed importance of weight loss and exercise in relationship to heartburn. Pap only    Harrington Challenger [facility], 12:38 PM 10/25/2011

## 2011-10-25 NOTE — Patient Instructions (Signed)
zostavac  Shingles vaccine

## 2011-10-27 ENCOUNTER — Encounter: Payer: Self-pay | Admitting: *Deleted

## 2011-10-27 ENCOUNTER — Ambulatory Visit: Payer: BC Managed Care – PPO | Admitting: Licensed Clinical Social Worker

## 2011-10-29 NOTE — Assessment & Plan Note (Signed)
Pt's sxs consistent w/ silent GERD.  Will restart PPI.  Encouraged f/u w/ GI.  If no improvement will refer to ENT.  Pt expressed understanding and is in agreement w/ plan.

## 2011-10-29 NOTE — Assessment & Plan Note (Signed)
Check labs for upcoming CPE 

## 2011-10-30 ENCOUNTER — Ambulatory Visit: Payer: BC Managed Care – PPO

## 2011-11-01 ENCOUNTER — Ambulatory Visit: Payer: BC Managed Care – PPO

## 2011-11-02 ENCOUNTER — Telehealth: Payer: Self-pay | Admitting: *Deleted

## 2011-11-02 ENCOUNTER — Ambulatory Visit: Payer: BC Managed Care – PPO

## 2011-11-02 NOTE — Telephone Encounter (Signed)
Pt left vm to discuss her labs results per noted pt had been left several messages with no contact made, letter was mailed to pt address with the following results  Labs show no ulcer causing bacteria, no evidence of infection. Liver functions are elevated- needs to stop all alcohol and tylenol and repeat in 2 weeks Trigs are also high- needs to watch amount of fat in diet. This will also improve w/ exercise. Repeat in 6 months. Vit D elevated-stop taking supplemants if taking  Pt vm stated she does not take vit d supplements nor does she drink any alcohol and she is not sure why this was in the results, I called pt to clarify that if she is not taking supplements this is ok and the alcohol and tylenol instructions is for her not to take this before she repeats her tests .left message to have patient return my call only one number noted in chart

## 2011-11-07 NOTE — Telephone Encounter (Signed)
Spoke to pt to advise she needed to stop taking any vit d or tylenol as well as hold off on any alcohol consumption until after we draw a new blood test, set pt up for a lab appt on 11-13-11 at 2:30pm, pt accepted appt and advised that she does not take any tylenol, alcohol or vit d supplements, I advised there can be vit d in the foods she eats, or sun exposure and that the when we re-draw labs we will be able to see if this could have possibly been a lab error however if any elevations remain MD Beverely Low will advise the next steps to take to ensure her health and well being. Pt then asked if we still run our labs in [place] per pt noted at the bottom of lab report the address for [employer], advised that the labs are processed at our [place] office however if anything needs to be sent further this can be the address the labs are sent to, pt understood and accepted the lab apt for 11-13-11 at 2:30pm

## 2011-11-07 NOTE — Telephone Encounter (Signed)
Patient returned phone call. Only # patient has is [phone].

## 2011-11-10 ENCOUNTER — Other Ambulatory Visit: Payer: Self-pay | Admitting: Family Medicine

## 2011-11-10 ENCOUNTER — Other Ambulatory Visit: Payer: Self-pay | Admitting: *Deleted

## 2011-11-10 DIAGNOSIS — R7989 Other specified abnormal findings of blood chemistry: Secondary | ICD-10-CM

## 2011-11-13 ENCOUNTER — Other Ambulatory Visit: Payer: BC Managed Care – PPO

## 2011-11-17 ENCOUNTER — Ambulatory Visit: Payer: BC Managed Care – PPO

## 2011-11-20 ENCOUNTER — Other Ambulatory Visit: Payer: Self-pay | Admitting: Family Medicine

## 2011-11-20 ENCOUNTER — Ambulatory Visit
Admission: RE | Admit: 2011-11-20 | Discharge: 2011-11-20 | Disposition: A | Discharge status: Home / Self Care | Payer: BC Managed Care – PPO | Source: Ambulatory Visit | Attending: Gynecology | Admitting: Gynecology

## 2011-11-20 DIAGNOSIS — D7289 Other specified disorders of white blood cells: Secondary | ICD-10-CM

## 2011-11-20 DIAGNOSIS — Z1231 Encounter for screening mammogram for malignant neoplasm of breast: Secondary | ICD-10-CM

## 2011-11-21 ENCOUNTER — Other Ambulatory Visit: Payer: Self-pay | Admitting: Family Medicine

## 2011-11-21 ENCOUNTER — Other Ambulatory Visit: Payer: BC Managed Care – PPO

## 2011-11-21 DIAGNOSIS — R7989 Other specified abnormal findings of blood chemistry: Secondary | ICD-10-CM

## 2011-11-21 DIAGNOSIS — R945 Abnormal results of liver function studies: Secondary | ICD-10-CM

## 2011-11-21 DIAGNOSIS — D7289 Other specified disorders of white blood cells: Secondary | ICD-10-CM

## 2011-11-22 LAB — HEPATIC FUNCTION PANEL
AST: 75 U/L — ABNORMAL HIGH (ref 0–37)
Alkaline Phosphatase: 76 U/L (ref 39–117)
Bilirubin, Direct: 0.1 mg/dL (ref 0.0–0.3)
Indirect Bilirubin: 0.5 mg/dL (ref 0.0–0.9)
Total Bilirubin: 0.6 mg/dL (ref 0.3–1.2)

## 2011-12-04 ENCOUNTER — Telehealth: Payer: Self-pay | Admitting: Family Medicine

## 2011-12-04 MED ORDER — ERGOCALCIFEROL 1.25 MG (50000 UT) PO CAPS: 50000.0000 [IU] | ORAL_CAPSULE | ORAL | Status: DC

## 2011-12-04 NOTE — Telephone Encounter (Signed)
Patient is requesting last lab results 

## 2011-12-05 NOTE — Telephone Encounter (Signed)
See phone note Pt already made aware of results.

## 2011-12-06 ENCOUNTER — Telehealth: Payer: Self-pay | Admitting: *Deleted

## 2011-12-06 DIAGNOSIS — R945 Abnormal results of liver function studies: Secondary | ICD-10-CM

## 2011-12-06 NOTE — Telephone Encounter (Signed)
Noted pt referral for [place] RUQ was placed with limited view, noted need to change to complete view, placed new order for [place] RUQ complete, referral rep notified

## 2011-12-11 ENCOUNTER — Other Ambulatory Visit (HOSPITAL_BASED_OUTPATIENT_CLINIC_OR_DEPARTMENT_OTHER): Payer: BC Managed Care – PPO

## 2011-12-12 ENCOUNTER — Ambulatory Visit (HOSPITAL_BASED_OUTPATIENT_CLINIC_OR_DEPARTMENT_OTHER)
Admission: RE | Admit: 2011-12-12 | Discharge: 2011-12-12 | Disposition: A | Discharge status: Home / Self Care | Payer: BC Managed Care – PPO | Source: Ambulatory Visit | Attending: Family Medicine | Admitting: Family Medicine

## 2011-12-12 DIAGNOSIS — R7989 Other specified abnormal findings of blood chemistry: Secondary | ICD-10-CM

## 2011-12-12 DIAGNOSIS — R945 Abnormal results of liver function studies: Secondary | ICD-10-CM | POA: Insufficient documentation

## 2011-12-18 ENCOUNTER — Ambulatory Visit: Payer: BC Managed Care – PPO | Admitting: Licensed Clinical Social Worker

## 2011-12-26 ENCOUNTER — Ambulatory Visit: Payer: BC Managed Care – PPO | Admitting: Licensed Clinical Social Worker

## 2012-01-10 ENCOUNTER — Ambulatory Visit: Payer: BC Managed Care – PPO | Admitting: Family Medicine

## 2012-01-10 ENCOUNTER — Ambulatory Visit (INDEPENDENT_AMBULATORY_CARE_PROVIDER_SITE_OTHER): Payer: BC Managed Care – PPO | Admitting: Family Medicine

## 2012-01-10 ENCOUNTER — Encounter: Payer: Self-pay | Admitting: Family Medicine

## 2012-01-10 VITALS — BP 116/70 | HR 78 | Temp 98.3°F | Wt 145.0 lb

## 2012-01-10 DIAGNOSIS — F329 Major depressive disorder, single episode, unspecified: Secondary | ICD-10-CM

## 2012-01-10 DIAGNOSIS — F341 Dysthymic disorder: Secondary | ICD-10-CM

## 2012-01-10 DIAGNOSIS — F419 Anxiety disorder, unspecified: Secondary | ICD-10-CM | POA: Insufficient documentation

## 2012-01-10 MED ORDER — SERTRALINE HCL 25 MG PO TABS: 25.0000 mg | ORAL_TABLET | Freq: Every day | ORAL | Status: DC

## 2012-01-10 NOTE — Progress Notes (Signed)
  Subjective:    Patient ID: [name] [name], female    DOB: [birth year], 61 y.o.   MRN: [id number]  HPI Anxiety- pt reports sxs for over a year.  'i'm afraid that my daughter will get kidnapped at the bus stop'.  Denies insomnia.  Admits to some depression.  'i'm just scared'.  Fearful to make a L turn while driving.  Scared of 'big trucks'.  Has some fear of bridges- 'just it's just cautious, it's safety'.  Does not fear shopping, eating out.  But does worry about how food is prepared.  Does not find herself preoccupied w/ germs.  Very concerned about daughter's safety- she is 16 and scared to let her drive.  Has never been on meds.   Review of Systems For ROS see HPI     Objective:   Physical Exam  Vitals reviewed. Constitutional: She appears well-developed and well-nourished. No distress.  Psychiatric: She has a normal mood and affect. Her behavior is normal.       Irrationally preoccupied w/ safety          Assessment & Plan:

## 2012-01-10 NOTE — Patient Instructions (Signed)
Follow up in 1 month Start the Zoloft daily at dinner This is a very low dose- we may need to go up Call with any questions or concerns Hang in there! Happy Belated Birthday!

## 2012-01-10 NOTE — Assessment & Plan Note (Signed)
New.  Start Zoloft as this has indication for anxiety and depression.  Pt not interested in counseling.  Will follow closely.

## 2012-01-11 ENCOUNTER — Ambulatory Visit: Payer: BC Managed Care – PPO | Admitting: Licensed Clinical Social Worker

## 2012-02-20 ENCOUNTER — Ambulatory Visit (INDEPENDENT_AMBULATORY_CARE_PROVIDER_SITE_OTHER): Payer: BC Managed Care – PPO | Admitting: Family Medicine

## 2012-02-20 ENCOUNTER — Ambulatory Visit: Payer: BC Managed Care – PPO | Admitting: Family Medicine

## 2012-02-20 ENCOUNTER — Encounter: Payer: Self-pay | Admitting: Family Medicine

## 2012-02-20 VITALS — BP 118/74 | HR 89 | Temp 98.1°F | Ht 60.5 in | Wt 142.4 lb

## 2012-02-20 DIAGNOSIS — F419 Anxiety disorder, unspecified: Secondary | ICD-10-CM

## 2012-02-20 DIAGNOSIS — F341 Dysthymic disorder: Secondary | ICD-10-CM

## 2012-02-20 MED ORDER — SERTRALINE HCL 50 MG PO TABS: 50.0000 mg | ORAL_TABLET | Freq: Every day | ORAL | Status: DC

## 2012-02-20 NOTE — Progress Notes (Signed)
  Subjective:    Patient ID: [name] [name], female    DOB: March 28, 1951, 61 y.o.   MRN: [id number]  HPI Anxiety/depression- pt was started on Zoloft 25 mg at last visit due to extreme anxiety and preoccupation w/ safety.  Pt reports med has helped 'a little bit'.  Would like to increase to 50mg .  Reports sleeping well.  Denies worsening depression.   Review of Systems For ROS see HPI     Objective:   Physical Exam  Vitals reviewed. Constitutional: She appears well-developed and well-nourished. No distress.  Psychiatric: She has a normal mood and affect. Her behavior is normal. Judgment and thought content normal.          Assessment & Plan:

## 2012-02-20 NOTE — Patient Instructions (Signed)
Increase the Zoloft to 50mg  (2 tabs of what you have and 1 of the new prescription) Call after 1-2 months and let me know how you're doing- or if you want to increase the dose Call with any questions or concerns Happy Belated Birthday!!

## 2012-02-20 NOTE — Assessment & Plan Note (Signed)
Mildly improved.  Pt would like to increase dose to see if it is more effective.  Will continue to follow.

## 2012-04-12 ENCOUNTER — Other Ambulatory Visit: Payer: Self-pay | Admitting: *Deleted

## 2012-04-12 MED ORDER — SERTRALINE HCL 100 MG PO TABS: 100.0000 mg | ORAL_TABLET | Freq: Every day | ORAL | Status: DC

## 2012-04-12 NOTE — Telephone Encounter (Signed)
rx sent to pharmacy by e-script Pt made aware of change and new dosage sent, noted to only take one of the 100mg  tabs once she picks them up, pt understood

## 2012-04-12 NOTE — Telephone Encounter (Signed)
Ok to increase to 100mg - take 2 of what she has and send new script for 100mg  #30, 6 refills

## 2012-05-09 ENCOUNTER — Ambulatory Visit: Payer: BC Managed Care – PPO | Admitting: Family Medicine

## 2012-06-09 ENCOUNTER — Other Ambulatory Visit: Payer: Self-pay | Admitting: Family Medicine

## 2012-06-10 NOTE — Telephone Encounter (Signed)
Pt noted on last OV 5-13 she has increased dosage to 100mg  daily and working well for her, pt noted cancelled apt on 05-09-12, declined RX fill for 50mg , per last refill sent on 04-12-12 #30 with 6 refills for correct dosage, thus refill declined

## 2012-06-19 ENCOUNTER — Telehealth: Payer: Self-pay | Admitting: *Deleted

## 2012-06-19 MED ORDER — ESCITALOPRAM OXALATE 10 MG PO TABS: 10.0000 mg | ORAL_TABLET | Freq: Every day | ORAL | Status: DC

## 2012-06-19 NOTE — Telephone Encounter (Signed)
Ok to do a 1 to 1 switch from Lexapro 10mg  daily

## 2012-06-19 NOTE — Telephone Encounter (Signed)
Pt agreed to change, sent new RX for lexapro 10mg  to pharmacy verified by pt, pt will STOP taking zoloft and start Lexapro

## 2012-06-19 NOTE — Telephone Encounter (Signed)
Pt left VM that Zoloft is causing uncontrolled sweating and would like to be changed to lexapro. Please advise

## 2012-06-21 ENCOUNTER — Encounter: Payer: Self-pay | Admitting: Family Medicine

## 2012-08-14 ENCOUNTER — Ambulatory Visit (INDEPENDENT_AMBULATORY_CARE_PROVIDER_SITE_OTHER): Payer: BC Managed Care – PPO | Admitting: Family Medicine

## 2012-08-14 ENCOUNTER — Encounter: Payer: Self-pay | Admitting: Family Medicine

## 2012-08-14 ENCOUNTER — Ambulatory Visit: Payer: BC Managed Care – PPO | Admitting: Family Medicine

## 2012-08-14 VITALS — BP 124/76 | HR 70 | Temp 98.1°F | Ht 60.25 in | Wt 145.0 lb

## 2012-08-14 DIAGNOSIS — F329 Major depressive disorder, single episode, unspecified: Secondary | ICD-10-CM

## 2012-08-14 DIAGNOSIS — F341 Dysthymic disorder: Secondary | ICD-10-CM

## 2012-08-14 DIAGNOSIS — R1013 Epigastric pain: Secondary | ICD-10-CM | POA: Insufficient documentation

## 2012-08-14 LAB — CBC WITH DIFFERENTIAL/PLATELET
Basophils Absolute: 0 10*3/uL (ref 0.0–0.1)
Eosinophils Relative: 1.1 % (ref 0.0–5.0)
Lymphocytes Relative: 21.3 % (ref 12.0–46.0)
Monocytes Relative: 6.1 % (ref 3.0–12.0)
Neutrophils Relative %: 71.2 % (ref 43.0–77.0)
Platelets: 234 10*3/uL (ref 150.0–400.0)
RDW: 13.9 % (ref 11.5–14.6)
WBC: 6 10*3/uL (ref 4.5–10.5)

## 2012-08-14 LAB — BASIC METABOLIC PANEL
CO2: 29 mEq/L (ref 19–32)
Calcium: 9.7 mg/dL (ref 8.4–10.5)
GFR: 60.49 mL/min (ref >60.00)
Glucose, Bld: 120 mg/dL — ABNORMAL HIGH (ref 70–99)
Potassium: 4.9 mEq/L (ref 3.5–5.1)
Sodium: 139 mEq/L (ref 135–145)

## 2012-08-14 LAB — HEPATIC FUNCTION PANEL
AST: 57 U/L — ABNORMAL HIGH (ref 0–37)
Albumin: 3.9 g/dL (ref 3.5–5.2)
Alkaline Phosphatase: 67 U/L (ref 39–117)
Bilirubin, Direct: 0.1 mg/dL (ref 0.0–0.3)
Total Protein: 7.4 g/dL (ref 6.0–8.3)

## 2012-08-14 LAB — H. PYLORI ANTIBODY, IGG: H Pylori IgG: NEGATIVE

## 2012-08-14 MED ORDER — CITALOPRAM HYDROBROMIDE 20 MG PO TABS: 20.0000 mg | ORAL_TABLET | Freq: Every day | ORAL | Status: DC

## 2012-08-14 NOTE — Assessment & Plan Note (Signed)
Unchanged.  Pt doesn't like how Lexapro makes her feel.  Felt ok on Zoloft except for excessive sweating.  Will switch to Celexa and follow.

## 2012-08-14 NOTE — Patient Instructions (Addendum)
We'll notify you of your lab results and make any changes if needed Try and make healthy food choices and get regular exercise STOP the Lexapro START the Celexa daily Call with any questions or concerns Happy Halloween!!

## 2012-08-14 NOTE — Assessment & Plan Note (Signed)
New.  No obvious cause.  Check labs to r/o h pylori, pancreatitis, liver involvement (although pt has recently had liver US).  Continue PPI.  Reviewed supportive care and red flags that should prompt return.  Pt expressed understanding and is in agreement w/ plan.

## 2012-08-14 NOTE — Progress Notes (Signed)
  Subjective:    Patient ID: [name] [name], female    DOB: 19-Feb-1951, 61 y.o.   MRN: [id number]  HPI abd pain- occuring under ribs bilaterally, particularly after over eating.  Has been dx'd w/ gastritis previously.  No N/V.  Pain for 3+ yrs.  Improves w/ tylenol.  Pain will last for 'hours' until pt takes tylenol.  Taking Prilosec- not Nexium.  No pain currently.  No changes to bowel habits.  'i don't have cancer, do i?'  Anxiety/Depression- chronic problem, was doing better on Zoloft but pt was 'sweating all the time'.  Switched to Lexapro.  Sweating improved but 'something's not right'.   Review of Systems For ROS see HPI     Objective:   Physical Exam  Vitals reviewed. Constitutional: She is oriented to person, place, and time. She appears well-developed and well-nourished. No distress.  HENT:  Head: Normocephalic and atraumatic.  Eyes: Conjunctivae normal and EOM are normal. Pupils are equal, round, and reactive to light.  Neck: Normal range of motion. Neck supple. No thyromegaly present.  Cardiovascular: Normal rate, regular rhythm, normal heart sounds and intact distal pulses.   No murmur heard. Pulmonary/Chest: Effort normal and breath sounds normal. No respiratory distress.  Abdominal: Soft. She exhibits no distension. There is no tenderness. There is no rebound and no guarding.  Musculoskeletal: She exhibits no edema.  Lymphadenopathy:    She has no cervical adenopathy.  Neurological: She is alert and oriented to person, place, and time.  Skin: Skin is warm and dry.  Psychiatric: Her behavior is normal.       Anxious Tangential thought process          Assessment & Plan:

## 2012-08-15 LAB — HEMOGLOBIN A1C: Hgb A1c MFr Bld: 6.1 % (ref 4.6–6.5)

## 2012-10-04 ENCOUNTER — Telehealth: Payer: Self-pay | Admitting: *Deleted

## 2012-10-04 NOTE — Telephone Encounter (Signed)
LM @ (4:54pm) with the pt's husband asking the pt to RTC.//AB/CMA

## 2012-10-04 NOTE — Telephone Encounter (Signed)
Pt states that celexa is making her feel more depression. Pt would like to change to Paxil. Pt notes that she has not tried this before but has tried lexapro and a had a similar response like the celexa..Please advise

## 2012-10-04 NOTE — Telephone Encounter (Signed)
Pt left VM that she would like to change to Paxil. Marland KitchenLeft message to call office to clarify why Pt would like to change med.

## 2012-10-04 NOTE — Telephone Encounter (Signed)
Pt has failed Zoloft, Lexapro and now celexa.  Based on this, not willing to start Paxil.  At this time, would start Wellbutrin XR 150mg  daily and follow up in 3-4 weeks

## 2012-10-07 MED ORDER — BUPROPION HCL ER (XL) 150 MG PO TB24: 150.0000 mg | ORAL_TABLET | Freq: Every day | ORAL | Status: DC

## 2012-10-07 NOTE — Telephone Encounter (Signed)
Spoke with the pt and informed her that Dr. Beverely Low would like to start her on Wellbutrin XL 150mg  daily, and she's to f/u in 3-4 weeks.   Pt agreed and sch'ed f/u appt.  New rx was sent to pharmacy by e-script.//AB/CMA

## 2012-10-31 ENCOUNTER — Ambulatory Visit: Payer: BC Managed Care – PPO | Admitting: Family Medicine

## 2012-11-10 ENCOUNTER — Encounter (HOSPITAL_BASED_OUTPATIENT_CLINIC_OR_DEPARTMENT_OTHER): Payer: Self-pay | Admitting: *Deleted

## 2012-11-10 ENCOUNTER — Observation Stay (HOSPITAL_BASED_OUTPATIENT_CLINIC_OR_DEPARTMENT_OTHER)
Admission: EM | Admit: 2012-11-10 | Discharge: 2012-11-11 | Disposition: A | Discharge status: Home / Self Care | Payer: BC Managed Care – PPO | Attending: Internal Medicine | Admitting: Internal Medicine

## 2012-11-10 ENCOUNTER — Emergency Department (HOSPITAL_BASED_OUTPATIENT_CLINIC_OR_DEPARTMENT_OTHER): Payer: BC Managed Care – PPO

## 2012-11-10 DIAGNOSIS — K219 Gastro-esophageal reflux disease without esophagitis: Secondary | ICD-10-CM | POA: Diagnosis present

## 2012-11-10 DIAGNOSIS — F419 Anxiety disorder, unspecified: Secondary | ICD-10-CM | POA: Diagnosis present

## 2012-11-10 DIAGNOSIS — E1169 Type 2 diabetes mellitus with other specified complication: Secondary | ICD-10-CM | POA: Diagnosis present

## 2012-11-10 DIAGNOSIS — Z79899 Other long term (current) drug therapy: Secondary | ICD-10-CM | POA: Insufficient documentation

## 2012-11-10 DIAGNOSIS — M79609 Pain in unspecified limb: Secondary | ICD-10-CM | POA: Insufficient documentation

## 2012-11-10 DIAGNOSIS — E785 Hyperlipidemia, unspecified: Secondary | ICD-10-CM | POA: Insufficient documentation

## 2012-11-10 DIAGNOSIS — K220 Achalasia of cardia: Secondary | ICD-10-CM | POA: Diagnosis present

## 2012-11-10 DIAGNOSIS — R0602 Shortness of breath: Secondary | ICD-10-CM | POA: Insufficient documentation

## 2012-11-10 DIAGNOSIS — F341 Dysthymic disorder: Secondary | ICD-10-CM

## 2012-11-10 DIAGNOSIS — F411 Generalized anxiety disorder: Secondary | ICD-10-CM | POA: Insufficient documentation

## 2012-11-10 DIAGNOSIS — F329 Major depressive disorder, single episode, unspecified: Secondary | ICD-10-CM | POA: Diagnosis present

## 2012-11-10 DIAGNOSIS — R61 Generalized hyperhidrosis: Secondary | ICD-10-CM | POA: Insufficient documentation

## 2012-11-10 DIAGNOSIS — R209 Unspecified disturbances of skin sensation: Secondary | ICD-10-CM | POA: Insufficient documentation

## 2012-11-10 DIAGNOSIS — F3289 Other specified depressive episodes: Secondary | ICD-10-CM | POA: Insufficient documentation

## 2012-11-10 DIAGNOSIS — R079 Chest pain, unspecified: Principal | ICD-10-CM | POA: Diagnosis present

## 2012-11-10 LAB — TROPONIN I: Troponin I: <0.3 ng/mL (ref <0.30)

## 2012-11-10 LAB — COMPREHENSIVE METABOLIC PANEL
Albumin: 4 g/dL (ref 3.5–5.2)
BUN: 20 mg/dL (ref 6–23)
Calcium: 10.2 mg/dL (ref 8.4–10.5)
Creatinine, Ser: 0.9 mg/dL (ref 0.50–1.10)
Total Bilirubin: 0.4 mg/dL (ref 0.3–1.2)
Total Protein: 7.7 g/dL (ref 6.0–8.3)

## 2012-11-10 LAB — URINALYSIS, ROUTINE W REFLEX MICROSCOPIC
Ketones, ur: NEGATIVE mg/dL
Leukocytes, UA: NEGATIVE
Nitrite: NEGATIVE
pH: 7 (ref 5.0–8.0)

## 2012-11-10 LAB — CBC WITH DIFFERENTIAL/PLATELET
Basophils Absolute: 0 10*3/uL (ref 0.0–0.1)
Basophils Relative: 0 % (ref 0–1)
Eosinophils Absolute: 0.2 10*3/uL (ref 0.0–0.7)
Eosinophils Relative: 2 % (ref 0–5)
HCT: 43.4 % (ref 36.0–46.0)
Hemoglobin: 15.2 g/dL — ABNORMAL HIGH (ref 12.0–15.0)
MCH: 29.4 pg (ref 26.0–34.0)
MCHC: 35 g/dL (ref 30.0–36.0)
Monocytes Absolute: 0.8 10*3/uL (ref 0.1–1.0)
Monocytes Relative: 10 % (ref 3–12)
RDW: 13.1 % (ref 11.5–15.5)

## 2012-11-10 LAB — D-DIMER, QUANTITATIVE: D-Dimer, Quant: 0.29 ug/mL-FEU (ref 0.00–0.48)

## 2012-11-10 MED ORDER — ASPIRIN 81 MG PO CHEW
324.0000 mg | CHEWABLE_TABLET | Freq: Once | ORAL | Status: AC
  Administered 2012-11-10: 162 mg via ORAL
  Filled 2012-11-10: qty 4

## 2012-11-10 MED ORDER — ONDANSETRON HCL 4 MG/2ML IJ SOLN: 4.0000 mg | Freq: Four times a day (QID) | INTRAMUSCULAR | Status: DC | PRN

## 2012-11-10 MED ORDER — HYDROMORPHONE HCL PF 1 MG/ML IJ SOLN: 1.0000 mg | INTRAMUSCULAR | Status: DC | PRN

## 2012-11-10 MED ORDER — BUPROPION HCL ER (XL) 150 MG PO TB24
150.0000 mg | ORAL_TABLET | Freq: Every day | ORAL | Status: DC
  Filled 2012-11-10: qty 1

## 2012-11-10 MED ORDER — SODIUM CHLORIDE 0.9 % IJ SOLN
3.0000 mL | Freq: Two times a day (BID) | INTRAMUSCULAR | Status: DC
  Administered 2012-11-11: 3 mL via INTRAVENOUS

## 2012-11-10 MED ORDER — ASPIRIN EC 81 MG PO TBEC
81.0000 mg | DELAYED_RELEASE_TABLET | Freq: Every day | ORAL | Status: DC
  Filled 2012-11-10: qty 1

## 2012-11-10 MED ORDER — SODIUM CHLORIDE 0.9 % IJ SOLN: 3.0000 mL | INTRAMUSCULAR | Status: DC | PRN

## 2012-11-10 MED ORDER — ONDANSETRON HCL 4 MG PO TABS: 4.0000 mg | ORAL_TABLET | Freq: Four times a day (QID) | ORAL | Status: DC | PRN

## 2012-11-10 MED ORDER — ENOXAPARIN SODIUM 40 MG/0.4ML ~~LOC~~ SOLN
40.0000 mg | Freq: Every day | SUBCUTANEOUS | Status: DC
  Filled 2012-11-10: qty 0.4

## 2012-11-10 MED ORDER — NITROGLYCERIN 2 % TD OINT
1.0000 [in_us] | TOPICAL_OINTMENT | Freq: Once | TRANSDERMAL | Status: AC
  Administered 2012-11-10: 1 [in_us] via TOPICAL
  Filled 2012-11-10: qty 1

## 2012-11-10 MED ORDER — SODIUM CHLORIDE 0.9 % IJ SOLN: 3.0000 mL | Freq: Two times a day (BID) | INTRAMUSCULAR | Status: DC

## 2012-11-10 MED ORDER — SODIUM CHLORIDE 0.9 % IV SOLN: 250.0000 mL | INTRAVENOUS | Status: DC | PRN

## 2012-11-10 NOTE — ED Notes (Signed)
Attempted to call rep[ort to receiving unit, placed on hold for several minutes.

## 2012-11-10 NOTE — ED Notes (Signed)
Pt states she has had recurrent left arm pain and SHOB x 1 week. Got worse today when she was shopping and will not go away. Feels weak and nauseated. Charge RN advised and EKG being done at triage.

## 2012-11-10 NOTE — ED Notes (Signed)
Patient states that she is having left arm pain and shortness of breath today while out shopping.

## 2012-11-10 NOTE — ED Provider Notes (Addendum)
History   This chart was scribed for [name] Bucco, MD by [name], ED Scribe. This patient was seen in room MH09/MH09 and the patient's care was started at 1629.   CSN: [id number]  Arrival date & time 11/10/12  1602   First MD Initiated Contact with Patient 11/10/12 1629      Chief Complaint  Patient presents with  . Chest Pain     The history is provided by the patient. No language interpreter was used.   [name] [name] is a 62 y.o. female who presents to the Emergency Department complaining of gradual onset, moderate, intermittent SOB which began 4 hours PTA while she was shopping. She reports she had a similar episode 3 weeks ago while she was traveling to [place] and has had intermittent episodes since. She reports associated left arm pain, cough and mild nausea. She denies chest discomfort, leg swelling, diaphoresis, pain while breathing or any other pain. Pt denies smoking and alcohol use.  She has a history of GERD and possibly high triglycerides. She has a family history of MI.  Past Medical History  Diagnosis Date  . GERD (gastroesophageal reflux disease)   . Hyperlipidemia   . Diverticulosis   . Achalasia   . Dysphagia   . Anxiety and depression     Past Surgical History  Procedure Date  . Laparoscopic myotomy     Family History  Problem Relation Age of Onset  . Colon cancer Neg Hx   . Hypertension Mother   . Heart disease Father     History  Substance Use Topics  . Smoking status: Former Games developer  . Smokeless tobacco: Never Used  . Alcohol Use: No    OB History    Grav Para Term Preterm Abortions TAB SAB Ect Mult Living   2 2        2       Review of Systems  Constitutional: Negative for fever, chills, diaphoresis and fatigue.  HENT: Negative for congestion, rhinorrhea and sneezing.   Eyes: Negative.   Respiratory: Positive for shortness of breath. Negative for cough and chest tightness.   Cardiovascular: Negative for chest pain and leg swelling.    Gastrointestinal: Negative for nausea, vomiting, abdominal pain, diarrhea and blood in stool.  Genitourinary: Negative for frequency, hematuria, flank pain and difficulty urinating.  Musculoskeletal: Negative for back pain and arthralgias.       Positive for left arm pain.  Skin: Negative for rash.  Neurological: Negative for dizziness, speech difficulty, weakness, numbness and headaches.    Allergies  Review of patient's allergies indicates no known allergies.  Home Medications   Current Outpatient Rx  Name  Route  Sig  Dispense  Refill  . BUPROPION HCL ER (XL) 150 MG PO TB24   Oral   Take 1 tablet (150 mg total) by mouth daily.   30 tablet   1   . CITALOPRAM HYDROBROMIDE 20 MG PO TABS   Oral   Take 1 tablet (20 mg total) by mouth daily.   30 tablet   3   . ERGOCALCIFEROL 50000 UNITS PO CAPS   Oral   Take 1 capsule (50,000 Units total) by mouth once a week.   4 capsule   12   . OMEPRAZOLE 20 MG PO CPDR   Oral   Take 20 mg by mouth daily.           Triage Vitals; BP 143/70  Pulse 103  Temp 97.9 F (36.6 C) (Oral)  Resp 20  Ht 5' (1.524 m)  Wt 143 lb (64.864 kg)  BMI 27.93 kg/m2  SpO2 99%  Physical Exam  Constitutional: She is oriented to person, place, and time. She appears well-developed and well-nourished.  HENT:  Head: Normocephalic and atraumatic.  Eyes: Pupils are equal, round, and reactive to light.  Neck: Normal range of motion. Neck supple.  Cardiovascular: Normal rate, regular rhythm and normal heart sounds.   Pulmonary/Chest: Effort normal and breath sounds normal. No respiratory distress. She has no wheezes. She has no rales. She exhibits no tenderness.  Abdominal: Soft. Bowel sounds are normal. There is no tenderness. There is no rebound and no guarding.  Musculoskeletal: Normal range of motion. She exhibits no edema.  Lymphadenopathy:    She has no cervical adenopathy.  Neurological: She is alert and oriented to person, place, and time.   Skin: Skin is warm and dry. No rash noted.  Psychiatric: She has a normal mood and affect.    ED Course  Procedures (including critical care time) DIAGNOSTIC STUDIES: Oxygen Saturation is 99% on room air, normal by my interpretation.    COORDINATION OF CARE: 5:44 PM Discussed treatment plan which includes labs and possible hospital admittance with pt at bedside and pt agreed to plan.     Results for orders placed during the hospital encounter of 11/10/12  CBC WITH DIFFERENTIAL      Component Value Range   WBC 7.8  4.0 - 10.5 K/uL   RBC 5.17 (*) 3.87 - 5.11 MIL/uL   Hemoglobin 15.2 (*) 12.0 - 15.0 g/dL   HCT 86.5  78.4 - 69.6 %   MCV 83.9  78.0 - 100.0 fL   MCH 29.4  26.0 - 34.0 pg   MCHC 35.0  30.0 - 36.0 g/dL   RDW 29.5  28.4 - 13.2 %   Platelets 246  150 - 400 K/uL   Neutrophils Relative 63  43 - 77 %   Neutro Abs 5.0  1.7 - 7.7 K/uL   Lymphocytes Relative 25  12 - 46 %   Lymphs Abs 1.9  0.7 - 4.0 K/uL   Monocytes Relative 10  3 - 12 %   Monocytes Absolute 0.8  0.1 - 1.0 K/uL   Eosinophils Relative 2  0 - 5 %   Eosinophils Absolute 0.2  0.0 - 0.7 K/uL   Basophils Relative 0  0 - 1 %   Basophils Absolute 0.0  0.0 - 0.1 K/uL  COMPREHENSIVE METABOLIC PANEL      Component Value Range   Sodium 137  135 - 145 mEq/L   Potassium 4.2  3.5 - 5.1 mEq/L   Chloride 97  96 - 112 mEq/L   CO2 23  19 - 32 mEq/L   Glucose, Bld 132 (*) 70 - 99 mg/dL   BUN 20  6 - 23 mg/dL   Creatinine, Ser 4.40  0.50 - 1.10 mg/dL   Calcium 10.2  8.4 - 72.5 mg/dL   Total Protein 7.7  6.0 - 8.3 g/dL   Albumin 4.0  3.5 - 5.2 g/dL   AST 66 (*) 0 - 37 U/L   ALT 91 (*) 0 - 35 U/L   Alkaline Phosphatase 85  39 - 117 U/L   Total Bilirubin 0.4  0.3 - 1.2 mg/dL   GFR calc non Af Amer 68 (*) >90 mL/min   GFR calc Af Amer 78 (*) >90 mL/min  TROPONIN I      Component Value Range  Troponin I <0.30  <0.30 ng/mL  URINALYSIS, ROUTINE W REFLEX MICROSCOPIC      Component Value Range   Color, Urine YELLOW   YELLOW   APPearance CLEAR  CLEAR   Specific Gravity, Urine 1.010  1.005 - 1.030   pH 7.0  5.0 - 8.0   Glucose, UA NEGATIVE  NEGATIVE mg/dL   Hgb urine dipstick NEGATIVE  NEGATIVE   Bilirubin Urine NEGATIVE  NEGATIVE   Ketones, ur NEGATIVE  NEGATIVE mg/dL   Protein, ur NEGATIVE  NEGATIVE mg/dL   Urobilinogen, UA 0.2  0.0 - 1.0 mg/dL   Nitrite NEGATIVE  NEGATIVE   Leukocytes, UA NEGATIVE  NEGATIVE  D-DIMER, QUANTITATIVE      Component Value Range   D-Dimer, Quant 0.29  0.00 - 0.48 ug/mL-FEU   Dg Chest 2 View  11/10/2012  *RADIOLOGY REPORT*  Clinical Data: Shortness of breath.  Left arm pain.  CHEST - 2 VIEW  Comparison: 11/12/2008  Findings: Mild cardiomegaly noted.  Minimal scarring at the left lung base with subsegmental atelectasis or scarring at the right lung base.  No pulmonary edema or pleural effusion.  IMPRESSION:  1.  Mild cardiomegaly, without edema. 2.  Bibasilar linear opacities, compatible with stable scarring at the left and subsegmental atelectasis or scarring on the right.   Original Report Authenticated By: Gaylyn Rong, M.D.      Dg Chest 2 View  11/10/2012  *RADIOLOGY REPORT*  Clinical Data: Shortness of breath.  Left arm pain.  CHEST - 2 VIEW  Comparison: 11/12/2008  Findings: Mild cardiomegaly noted.  Minimal scarring at the left lung base with subsegmental atelectasis or scarring at the right lung base.  No pulmonary edema or pleural effusion.  IMPRESSION:  1.  Mild cardiomegaly, without edema. 2.  Bibasilar linear opacities, compatible with stable scarring at the left and subsegmental atelectasis or scarring on the right.   Original Report Authenticated By: Gaylyn Rong, M.D.     Date: 11/10/2012  Rate: 94  Rhythm: normal sinus rhythm  QRS Axis: normal  Intervals: normal  ST/T Wave abnormalities: nonspecific ST/T changes  Conduction Disutrbances:none  Narrative Interpretation:   Old EKG Reviewed: unchanged    1. Chest pain       MDM  Pt is  pain free now.  Has strong FH, father died of MI at age 34.  Consulted the hospitalist at [facility] who will admit pt for further cardiac evaluation  I personally performed the services described in this documentation, which was scribed in my presence.  The recorded information has been reviewed and considered.         [name] Bucco, MD 11/10/12 0272  [name] Bucco, MD 11/10/12 2034

## 2012-11-10 NOTE — H&P (Signed)
PCP:   Neena Rhymes, MD   Chief Complaint:  Chest pain  HPI: 62 yo female with several days of on/off left arm pain and numbness with associated sob and diaphoresis.  No cp.  Pain is arm in pressure like.  Worse with exertion.  Persisted for over an hour today so was worried it was her heart.  No pain with movement of left shoulder.  No cardiac history.  No fevers.  No cough.  No recent injuries.  Transferred from urgent care [facility] for further cardiac evaluation.  No pain right now.  Review of Systems:  O/w neg  Past Medical History: Past Medical History  Diagnosis Date  . GERD (gastroesophageal reflux disease)   . Hyperlipidemia   . Diverticulosis   . Achalasia   . Dysphagia   . Anxiety and depression    Past Surgical History  Procedure Date  . Laparoscopic myotomy     Medications: Prior to Admission medications   Medication Sig Start Date End Date Taking? Authorizing Provider  buPROPion (WELLBUTRIN XL) 150 MG 24 hr tablet Take 1 tablet (150 mg total) by mouth daily. 10/07/12  Yes Sheliah Hatch, MD  ibuprofen (ADVIL,MOTRIN) 200 MG tablet Take 200 mg by mouth every 6 (six) hours as needed. For pain   Yes Historical Provider, MD  omeprazole (PRILOSEC) 20 MG capsule Take 20 mg by mouth daily.   Yes Historical Provider, MD    Allergies:  No Known Allergies  Social History:  reports that she has quit smoking. She has never used smokeless tobacco. She reports that she does not drink alcohol or use illicit drugs.  Family History: Family History  Problem Relation Age of Onset  . Colon cancer Neg Hx   . Hypertension Mother   . Heart disease Father     Physical Exam: Filed Vitals:   11/10/12 1611 11/10/12 1854 11/10/12 2018 11/10/12 2225  BP: 143/70 120/56 129/69 126/78  Pulse: 103 84 81 78  Temp: 97.9 F (36.6 C)   97.9 F (36.6 C)  TempSrc: Oral     Resp: 20 20 15 16   Height: 5' (1.524 m)   5' (1.524 m)  Weight: 64.864 kg (143 lb)   67.268 kg (148 lb 4.8  oz)  SpO2: 99% 98% 99% 95%   General appearance: alert, cooperative and no distress Neck: no JVD and supple, symmetrical, trachea midline Lungs: clear to auscultation bilaterally Heart: regular rate and rhythm, S1, S2 normal, no murmur, click, rub or gallop Abdomen: soft, non-tender; bowel sounds normal; no masses,  no organomegaly Extremities: extremities normal, atraumatic, no cyanosis or edema Pulses: 2+ and symmetric Skin: Skin color, texture, turgor normal. No rashes or lesions Neurologic: Grossly normal    Labs on Admission:   [facility] 11/10/12 1624  NA 137  K 4.2  CL 97  CO2 23  GLUCOSE 132*  BUN 20  CREATININE 0.90  CALCIUM 10.2  MG --  PHOS --    Basename 11/10/12 1624  AST 66*  ALT 91*  ALKPHOS 85  BILITOT 0.4  PROT 7.7  ALBUMIN 4.0    Basename 11/10/12 1624  WBC 7.8  NEUTROABS 5.0  HGB 15.2*  HCT 43.4  MCV 83.9  PLT 246    Basename 11/10/12 1624  CKTOTAL --  CKMB --  CKMBINDEX --  TROPONINI <0.30   Radiological Exams on Admission: Dg Chest 2 View  11/10/2012  *RADIOLOGY REPORT*  Clinical Data: Shortness of breath.  Left arm pain.  CHEST -  2 VIEW  Comparison: 11/12/2008  Findings: Mild cardiomegaly noted.  Minimal scarring at the left lung base with subsegmental atelectasis or scarring at the right lung base.  No pulmonary edema or pleural effusion.  IMPRESSION:  1.  Mild cardiomegaly, without edema. 2.  Bibasilar linear opacities, compatible with stable scarring at the left and subsegmental atelectasis or scarring on the right.   Original Report Authenticated By: Gaylyn Rong, M.D.     Assessment/Plan 62 yo female with atypical cp  Principal Problem:  *Chest pain Active Problems:  HYPERLIPIDEMIA  GERD  Achalasia  Anxiety and depression  Pain very atypical.  Romi.  Tele.  ekg nsr no acute changes.  cxr neg.  Asa.  Echo in am.  outpt stress testing if everything normal.  obs.  Nyeshia Mysliwiec A 11/10/2012, 10:37 PM

## 2012-11-11 ENCOUNTER — Encounter (HOSPITAL_COMMUNITY): Payer: Self-pay | Admitting: *Deleted

## 2012-11-11 ENCOUNTER — Ambulatory Visit: Payer: BC Managed Care – PPO | Admitting: Family Medicine

## 2012-11-11 LAB — CBC
MCH: 30.2 pg (ref 26.0–34.0)
MCHC: 35.4 g/dL (ref 30.0–36.0)
Platelets: 231 10*3/uL (ref 150–400)
RBC: 4.87 MIL/uL (ref 3.87–5.11)

## 2012-11-11 LAB — TROPONIN I: Troponin I: <0.3 ng/mL (ref <0.30)

## 2012-11-11 LAB — CREATININE, SERUM: Creatinine, Ser: 0.81 mg/dL (ref 0.50–1.10)

## 2012-11-11 MED ORDER — ASPIRIN 81 MG PO TBEC: 81.0000 mg | DELAYED_RELEASE_TABLET | Freq: Every day | ORAL | Status: DC

## 2012-11-11 NOTE — Discharge Summary (Signed)
Physician Discharge Summary  [name] [name] ZOX:[id number] DOB: [birth year] DOA: 11/10/2012  PCP: Neena Rhymes, MD  Admit date: 11/10/2012 Discharge date: 11/11/2012  Time spent: 30 minutes  Recommendations for Outpatient Follow-up:  1. Follow up with cardiology and PCP as recommended  Discharge Diagnoses:  Principal Problem:  *Chest pain Active Problems:  HYPERLIPIDEMIA  GERD  Achalasia  Anxiety and depression   Discharge Condition: stable  Diet recommendation: regular diet  Filed Weights   11/10/12 1611 11/10/12 2225  Weight: 64.864 kg (143 lb) 67.268 kg (148 lb 4.8 oz)    History of present illness:    Hospital Course:  1. ATYPICAL CHEST PAIN/ left arm pain and numbness: resolved.  ACS ruled out. Cardiac enzymes negative. 12 lead EKG is NSR. Echo showed good LVEF . She was started on aspirin 81 mg and patient wanted oto go home, outpatient cardiology appointment scheduled for her on feb 6th .   2. GERD: asymptomatic.   3. Hyperlipidimia. Outpatient follow up.   Consultations:  NONE  Discharge Exam: Filed Vitals:   11/10/12 1854 11/10/12 2018 11/10/12 2225 11/11/12 0600  BP: 120/56 129/69 126/78 110/70  Pulse: 84 81 78 83  Temp:   97.9 F (36.6 C) 98 F (36.7 C)  TempSrc:      Resp: 20 15 16 16   Height:   5' (1.524 m)   Weight:   67.268 kg (148 lb 4.8 oz)   SpO2: 98% 99% 95% 94%   General appearance: alert, cooperative and no distress Lungs: clear to auscultation bilaterally  Heart: regular rate and rhythm, S1, S2 normal, no murmur, click, rub or gallop  Abdomen: soft, non-tender; bowel sounds normal; no masses, no organomegaly  Extremities: extremities normal, atraumatic, no cyanosis or edema  Pulses: 2+ and symmetric  Skin: Skin color, texture, turgor normal. No rashes or lesions  Neurologic: Grossly normal   Discharge Instructions  Discharge Orders    Future Appointments: Provider: Department: Dept Phone: Center:   11/21/2012 2:30 PM Kathleene Hazel, MD [place] [facility] Main [facility] [place]) [phone] [id number]     Future Orders Please Complete By Expires   Diet general      Discharge instructions      Comments:   Follow up with [facility] for stress test.   Activity as tolerated - No restrictions          Medication List     As of 11/11/2012  1:34 PM    TAKE these medications         aspirin 81 MG EC tablet   Take 1 tablet (81 mg total) by mouth daily.      buPROPion 150 MG 24 hr tablet   Commonly known as: WELLBUTRIN XL   Take 1 tablet (150 mg total) by mouth daily.      ibuprofen 200 MG tablet   Commonly known as: ADVIL,MOTRIN   Take 200 mg by mouth every 6 (six) hours as needed. For pain      omeprazole 20 MG capsule   Commonly known as: PRILOSEC   Take 20 mg by mouth daily.           Follow-up Information    Follow up with Neena Rhymes, MD. In 1 week.   Contact information:   [address] [place] [address] [phone]       Follow up with Verne Carrow, MD. On 11/21/2012. (at 2 15 pm. for cardiology appointment for possible stress test schedule. )  Contact information:   [address] [place] [address] [phone]           The results of significant diagnostics from this hospitalization (including imaging, microbiology, ancillary and laboratory) are listed below for reference.    Significant Diagnostic Studies: Dg Chest 2 View  11/10/2012  *RADIOLOGY REPORT*  Clinical Data: Shortness of breath.  Left arm pain.  CHEST - 2 VIEW  Comparison: 11/12/2008  Findings: Mild cardiomegaly noted.  Minimal scarring at the left lung base with subsegmental atelectasis or scarring at the right lung base.  No pulmonary edema or pleural effusion.  IMPRESSION:  1.  Mild cardiomegaly, without edema. 2.  Bibasilar linear opacities, compatible with stable scarring at the left and subsegmental atelectasis or scarring on the right.   Original Report  Authenticated By: Gaylyn Rong, M.D.     Microbiology: No results found for this or any previous visit (from the past 240 hour(s)).   Labs: Basic Metabolic Panel:  Lab 11/11/12 08/65/78 1624  NA -- 137  K -- 4.2  CL -- 97  CO2 -- 23  GLUCOSE -- 132*  BUN -- 20  CREATININE 0.81 0.90  CALCIUM -- 10.2  MG -- --  PHOS -- --   Liver Function Tests:  Lab 11/10/12 1624  AST 66*  ALT 91*  ALKPHOS 85  BILITOT 0.4  PROT 7.7  ALBUMIN 4.0   No results found for this basename: LIPASE:5,AMYLASE:5 in the last 168 hours No results found for this basename: AMMONIA:5 in the last 168 hours CBC:  Lab 11/11/12 11/10/12 1624  WBC 7.5 7.8  NEUTROABS -- 5.0  HGB 14.7 15.2*  HCT 41.5 43.4  MCV 85.2 83.9  PLT 231 246   Cardiac Enzymes:  Lab 11/11/12 1154 11/11/12 0730 11/10/12 2351 11/10/12 1624  CKTOTAL -- -- -- --  CKMB -- -- -- --  CKMBINDEX -- -- -- --  TROPONINI <0.30 <0.30 <0.30 <0.30   BNP: BNP (last 3 results) No results found for this basename: PROBNP:3 in the last 8760 hours CBG: No results found for this basename: GLUCAP:5 in the last 168 hours     Signed:  Sonam Huelsmann  [facility] 11/11/2012, 1:34 PM

## 2012-11-11 NOTE — Progress Notes (Signed)
  Echocardiogram 2D Echocardiogram has been performed.  Cathie Beams 11/11/2012, 9:28 AM

## 2012-11-21 ENCOUNTER — Ambulatory Visit: Payer: BC Managed Care – PPO | Admitting: Cardiovascular Disease

## 2012-12-09 ENCOUNTER — Encounter: Payer: Self-pay | Admitting: Family Medicine

## 2012-12-09 ENCOUNTER — Ambulatory Visit (INDEPENDENT_AMBULATORY_CARE_PROVIDER_SITE_OTHER): Payer: BC Managed Care – PPO | Admitting: Family Medicine

## 2012-12-09 VITALS — BP 110/66 | HR 89 | Temp 98.0°F | Ht 60.25 in | Wt 151.2 lb

## 2012-12-09 DIAGNOSIS — R1011 Right upper quadrant pain: Secondary | ICD-10-CM | POA: Insufficient documentation

## 2012-12-09 DIAGNOSIS — K219 Gastro-esophageal reflux disease without esophagitis: Secondary | ICD-10-CM

## 2012-12-09 LAB — CBC WITH DIFFERENTIAL/PLATELET
Basophils Absolute: 0 10*3/uL (ref 0.0–0.1)
Basophils Relative: 0.1 % (ref 0.0–3.0)
Eosinophils Absolute: 0.3 10*3/uL (ref 0.0–0.7)
Lymphocytes Relative: 21.6 % (ref 12.0–46.0)
MCHC: 34.9 g/dL (ref 30.0–36.0)
Neutrophils Relative %: 68.3 % (ref 43.0–77.0)
RBC: 4.75 Mil/uL (ref 3.87–5.11)

## 2012-12-09 LAB — HEPATIC FUNCTION PANEL
AST: 74 U/L — ABNORMAL HIGH (ref 0–37)
Albumin: 3.8 g/dL (ref 3.5–5.2)
Alkaline Phosphatase: 68 U/L (ref 39–117)
Total Protein: 6.8 g/dL (ref 6.0–8.3)

## 2012-12-09 LAB — BASIC METABOLIC PANEL
CO2: 25 mEq/L (ref 19–32)
Calcium: 9.4 mg/dL (ref 8.4–10.5)
Creatinine, Ser: 0.9 mg/dL (ref 0.4–1.2)

## 2012-12-09 MED ORDER — BUPROPION HCL ER (XL) 150 MG PO TB24: 150.0000 mg | ORAL_TABLET | Freq: Every day | ORAL | Status: DC

## 2012-12-09 MED ORDER — RANITIDINE HCL 150 MG PO TABS: 150.0000 mg | ORAL_TABLET | Freq: Every day | ORAL | Status: DC

## 2012-12-09 NOTE — Assessment & Plan Note (Signed)
No relief w/ low dose omeprazole.  Pt found good relief w/ OTC zantac.  Will switch.  Pt wants to start at 150mg  daily.  Discussed she is able to increase to 150mg  BID prn.  Reviewed supportive care and red flags that should prompt return.  Pt expressed understanding and is in agreement w/ plan.

## 2012-12-09 NOTE — Progress Notes (Signed)
  Subjective:    Patient ID: [name] [name], female    DOB: 1951/01/16, 62 y.o.   MRN: [id number]  HPI abd pain- RUQ, had sharp pain last week, has decreased to dull pain.  Worse after eating.  No nausea.  No radiation to R shoulder.  No radiation into chest.  Hx of GERD.  Does worsen w/ fatty foods.  No diarrhea.  + constipation.  Hx of elevated LFTs.  GERD- no relief w/ omeprazole, would like to increase dose or switch meds.  Took OTC Zantac 150mg  w/ good relief.   Review of Systems For ROS see HPI     Objective:   Physical Exam  Vitals reviewed. Constitutional: She is oriented to person, place, and time. She appears well-developed and well-nourished. No distress.  Neck: Normal range of motion. Neck supple. No thyromegaly present.  Cardiovascular: Normal rate, regular rhythm, normal heart sounds and intact distal pulses.   Pulmonary/Chest: Effort normal and breath sounds normal. No respiratory distress. She has no wheezes. She has no rales.  Abdominal: Soft. Bowel sounds are normal. She exhibits no distension. There is no tenderness. There is no rebound and no guarding.  Musculoskeletal: She exhibits no edema.  Lymphadenopathy:    She has no cervical adenopathy.  Neurological: She is alert and oriented to person, place, and time.          Assessment & Plan:

## 2012-12-09 NOTE — Assessment & Plan Note (Signed)
New.  Pt's sxs of pain after eating, worse w/ fatty foods, and elevated LFTs could be consistent w/ biliary colic.  Repeat labs, reviewed dietary modifications, get RUQ [place] and refer to either surgery or GI prn.  Reviewed supportive care and red flags that should prompt return.  Pt expressed understanding and is in agreement w/ plan.

## 2012-12-09 NOTE — Patient Instructions (Addendum)
We'll notify you of your lab results and make any changes if needed Stop the Omeprazole Start the Zantac 1-2x/day We'll call you with your ultrasound appt Call with any questions or concerns Hang in there!!!

## 2012-12-10 NOTE — Addendum Note (Signed)
Addended by: Sheliah Hatch on: 12/10/2012 11:47 AM   Modules accepted: Orders

## 2012-12-13 ENCOUNTER — Ambulatory Visit (HOSPITAL_BASED_OUTPATIENT_CLINIC_OR_DEPARTMENT_OTHER)
Admission: RE | Admit: 2012-12-13 | Discharge: 2012-12-13 | Disposition: A | Discharge status: Home / Self Care | Payer: BC Managed Care – PPO | Source: Ambulatory Visit | Attending: Family Medicine | Admitting: Family Medicine

## 2012-12-13 DIAGNOSIS — R1011 Right upper quadrant pain: Secondary | ICD-10-CM | POA: Insufficient documentation

## 2012-12-13 DIAGNOSIS — K7689 Other specified diseases of liver: Secondary | ICD-10-CM | POA: Insufficient documentation

## 2012-12-13 DIAGNOSIS — E785 Hyperlipidemia, unspecified: Secondary | ICD-10-CM | POA: Insufficient documentation

## 2012-12-19 ENCOUNTER — Telehealth: Payer: Self-pay | Admitting: Family Medicine

## 2012-12-19 NOTE — Telephone Encounter (Signed)
returned your call please call at [phone]

## 2012-12-24 NOTE — Telephone Encounter (Signed)
LM @ (1:24pm) asking the pt to RTC.//AB/CMA

## 2012-12-24 NOTE — Telephone Encounter (Signed)
Spoke with the pt and informed her of recent US results.//AB/CMA

## 2013-01-17 ENCOUNTER — Telehealth: Payer: Self-pay | Admitting: Family Medicine

## 2013-01-17 DIAGNOSIS — F329 Major depressive disorder, single episode, unspecified: Secondary | ICD-10-CM

## 2013-01-17 MED ORDER — BUPROPION HCL ER (XL) 150 MG PO TB24: 150.0000 mg | ORAL_TABLET | Freq: Two times a day (BID) | ORAL | Status: DC

## 2013-01-17 NOTE — Telephone Encounter (Signed)
Ok to increase Wellbutrin to 1 tab bid.  Please change prescription to reflect new directions and change quantity to 60 each month

## 2013-01-17 NOTE — Telephone Encounter (Signed)
Last OV 12/09/12, currently taking Wellbutrin 150 mg once daily, please advise

## 2013-01-17 NOTE — Telephone Encounter (Signed)
Patient would like to know if she can take 2 wellbutrin a day. She would like to take 1 in the AM and 1 in the PM. Please advise.

## 2013-02-09 ENCOUNTER — Other Ambulatory Visit: Payer: Self-pay | Admitting: Family Medicine

## 2013-02-10 NOTE — Telephone Encounter (Signed)
Med filled.  

## 2013-02-12 ENCOUNTER — Ambulatory Visit (INDEPENDENT_AMBULATORY_CARE_PROVIDER_SITE_OTHER): Payer: BC Managed Care – PPO | Admitting: Family Medicine

## 2013-02-12 ENCOUNTER — Encounter: Payer: Self-pay | Admitting: Family Medicine

## 2013-02-12 VITALS — BP 120/80 | HR 109 | Temp 98.1°F | Ht 60.25 in | Wt 149.2 lb

## 2013-02-12 DIAGNOSIS — R220 Localized swelling, mass and lump, head: Secondary | ICD-10-CM

## 2013-02-12 NOTE — Progress Notes (Signed)
  Subjective:    Patient ID: [name] [name], female    DOB: 11-01-1950, 62 y.o.   MRN: [id number]  HPI Bump on tongue- 'i'm scared i have cancer'.  Pt reports purple bump on tongue, not painful.  Found area last week, uncertain if it was there prior.  Not changing in size or shape.  Last dental exam 18 months ago.  No recent weight loss.  No unexplained fevers or sweats.  Pt very anxious   Review of Systems For ROS see HPI     Objective:   Physical Exam  Vitals reviewed. Constitutional: She appears well-developed and well-nourished. No distress.  HENT:  Head: Normocephalic and atraumatic.  Mouth/Throat: Oropharynx is clear and moist.  L lateral tongue edge w/ <0.5 cm slightly elevated purple apparent vascular bump.  No ulceration, crusting, or other concerning features          Assessment & Plan:

## 2013-02-12 NOTE — Patient Instructions (Addendum)
This appears to be a dilated blood vessel (vein)- like a varicose vein of the tongue Get your dentist to take a look At this point, there's nothing to do! Call with any questions or concerns Try and relax!!

## 2013-02-12 NOTE — Assessment & Plan Note (Signed)
New.  Area in question appears to be a venous malformation.  No obvious cancerous features.  Encouraged pt to keep her upcoming dental appt.  Reassurance provided but pt seems skeptical.  Will follow.

## 2013-04-12 ENCOUNTER — Other Ambulatory Visit: Payer: Self-pay | Admitting: Family Medicine

## 2013-04-15 NOTE — Telephone Encounter (Signed)
Lvm to call office. Need to know if she is taking two 150mg  wellbutrin or one daily. GF///RN

## 2013-04-21 ENCOUNTER — Ambulatory Visit: Payer: BC Managed Care – PPO | Admitting: Family Medicine

## 2013-04-24 ENCOUNTER — Encounter: Payer: Self-pay | Admitting: Family Medicine

## 2013-04-24 ENCOUNTER — Ambulatory Visit (INDEPENDENT_AMBULATORY_CARE_PROVIDER_SITE_OTHER): Payer: BC Managed Care – PPO | Admitting: Family Medicine

## 2013-04-24 VITALS — BP 122/70 | HR 90 | Temp 98.5°F | Wt 142.0 lb

## 2013-04-24 DIAGNOSIS — F419 Anxiety disorder, unspecified: Secondary | ICD-10-CM

## 2013-04-24 DIAGNOSIS — M7711 Lateral epicondylitis, right elbow: Secondary | ICD-10-CM

## 2013-04-24 DIAGNOSIS — F341 Dysthymic disorder: Secondary | ICD-10-CM

## 2013-04-24 DIAGNOSIS — M7710 Lateral epicondylitis, unspecified elbow: Secondary | ICD-10-CM | POA: Insufficient documentation

## 2013-04-24 MED ORDER — FLUOXETINE HCL 20 MG PO TABS: 20.0000 mg | ORAL_TABLET | Freq: Every day | ORAL | Status: DC

## 2013-04-24 NOTE — Progress Notes (Signed)
  Subjective:    Patient ID: [name] [name], female    DOB: 1950/11/21, 63 y.o.   MRN: [id number]  HPI Arm pain- R arm, pain occuring over R lateral epicondyle.  Painful w/ repetitive motion.  No swelling or palpable mass.  sxs started 5-6 months ago.  Pt is R hand dominant.  No radiation into forearm.  No weakness of grip or numbness  Anxiety- pt concerned b/c she is forgetting people's names.  Feels this started when she began the Wellbutrin.  Has previously been on Zoloft, Celexa, Lexapro.     Review of Systems For ROS see HPI     Objective:   Physical Exam  Vitals reviewed. Constitutional: She appears well-developed and well-nourished. No distress.  Musculoskeletal:  + TTP over R lateral epicondyle  Skin: Skin is warm and dry.  Psychiatric: She has a normal mood and affect. Her behavior is normal.          Assessment & Plan:

## 2013-04-24 NOTE — Assessment & Plan Note (Signed)
New.  Start scheduled NSAIDs and ice PRN.  Reviewed dx and tx.  Pt expressed understanding and is in agreement w/ plan.

## 2013-04-24 NOTE — Assessment & Plan Note (Signed)
Pt reports mental confusion since starting wellbutrin.  Feels it controls anxiety but doesn't like the memory clouding.  Switch to Prozac.  Will follow.

## 2013-04-24 NOTE — Patient Instructions (Addendum)
STOP the Wellbutrin START the Prozac daily The arm pain is tennis elbow ICE when painful Ibuprofen as needed Call with any questions or concerns Have a great summer!!!

## 2013-05-05 ENCOUNTER — Telehealth: Payer: Self-pay | Admitting: *Deleted

## 2013-05-05 ENCOUNTER — Other Ambulatory Visit: Payer: Self-pay

## 2013-05-05 DIAGNOSIS — Z1231 Encounter for screening mammogram for malignant neoplasm of breast: Secondary | ICD-10-CM

## 2013-05-05 MED ORDER — ACETAZOLAMIDE 125 MG PO TABS
125.0000 mg | ORAL_TABLET | Freq: Two times a day (BID) | ORAL | Status: DC
Start: 2013-05-05 — End: 2014-11-18

## 2013-05-05 NOTE — Telephone Encounter (Signed)
Rx for daimox sent to [facility] per pt request.

## 2013-05-05 NOTE — Telephone Encounter (Signed)
Advised pt of MD notes she stated she "has looked up diamox on the internet and it may help with high altitude problems."

## 2013-05-05 NOTE — Telephone Encounter (Signed)
There is no such medicine that i'm aware of.  Does she mean motion sickness

## 2013-05-05 NOTE — Telephone Encounter (Signed)
Patient left message on triage line is requesting medication for "altitude changes" when she travels to [place] next month. Please advise.

## 2013-05-05 NOTE — Telephone Encounter (Signed)
Can have Diamox 125mg  BID starting 1-2 days prior to ascent up a mountain and continuing for at least 5 days at higher altitude.  #20, no refills.  This is rarely required unless people are backpacking up very tall mountains (such as [place] or [address]) or spending time at [facility] in [place]

## 2013-05-21 ENCOUNTER — Ambulatory Visit: Payer: BC Managed Care – PPO

## 2013-06-11 ENCOUNTER — Ambulatory Visit
Admission: RE | Admit: 2013-06-11 | Discharge: 2013-06-11 | Disposition: A | Discharge status: Home / Self Care | Payer: BC Managed Care – PPO | Source: Ambulatory Visit

## 2013-06-11 ENCOUNTER — Other Ambulatory Visit: Payer: Self-pay | Admitting: Family Medicine

## 2013-06-11 DIAGNOSIS — R921 Mammographic calcification found on diagnostic imaging of breast: Secondary | ICD-10-CM

## 2013-06-11 DIAGNOSIS — Z1231 Encounter for screening mammogram for malignant neoplasm of breast: Secondary | ICD-10-CM

## 2013-07-17 ENCOUNTER — Encounter: Payer: Self-pay | Admitting: Gastroenterology

## 2013-08-11 ENCOUNTER — Other Ambulatory Visit: Payer: Self-pay | Admitting: *Deleted

## 2013-08-11 MED ORDER — FLUOXETINE HCL 20 MG PO TABS: 20.0000 mg | ORAL_TABLET | Freq: Every day | ORAL | Status: DC

## 2013-08-11 NOTE — Telephone Encounter (Signed)
[facility]ill [facility]

## 2013-12-10 ENCOUNTER — Ambulatory Visit (INDEPENDENT_AMBULATORY_CARE_PROVIDER_SITE_OTHER): Payer: BC Managed Care – PPO | Admitting: Family Medicine

## 2013-12-10 ENCOUNTER — Encounter: Payer: Self-pay | Admitting: General Practice

## 2013-12-10 ENCOUNTER — Encounter: Payer: Self-pay | Admitting: Family Medicine

## 2013-12-10 VITALS — BP 118/72 | HR 88 | Temp 98.3°F | Resp 16 | Wt 147.4 lb

## 2013-12-10 DIAGNOSIS — K219 Gastro-esophageal reflux disease without esophagitis: Secondary | ICD-10-CM

## 2013-12-10 DIAGNOSIS — E785 Hyperlipidemia, unspecified: Secondary | ICD-10-CM

## 2013-12-10 DIAGNOSIS — R61 Generalized hyperhidrosis: Secondary | ICD-10-CM

## 2013-12-10 DIAGNOSIS — J209 Acute bronchitis, unspecified: Secondary | ICD-10-CM | POA: Insufficient documentation

## 2013-12-10 LAB — CBC WITH DIFFERENTIAL/PLATELET
BASOS ABS: 0 10*3/uL (ref 0.0–0.1)
BASOS PCT: 0.4 % (ref 0.0–3.0)
EOS ABS: 0.1 10*3/uL (ref 0.0–0.7)
Eosinophils Relative: 0.9 % (ref 0.0–5.0)
HCT: 47.1 % — ABNORMAL HIGH (ref 36.0–46.0)
Hemoglobin: 15.5 g/dL — ABNORMAL HIGH (ref 12.0–15.0)
Lymphocytes Relative: 29.2 % (ref 12.0–46.0)
Lymphs Abs: 1.8 10*3/uL (ref 0.7–4.0)
MCHC: 32.8 g/dL (ref 30.0–36.0)
MCV: 89.1 fl (ref 78.0–100.0)
MONO ABS: 0.6 10*3/uL (ref 0.1–1.0)
Monocytes Relative: 10.5 % (ref 3.0–12.0)
NEUTROS PCT: 59 % (ref 43.0–77.0)
Neutro Abs: 3.6 10*3/uL (ref 1.4–7.7)
PLATELETS: 242 10*3/uL (ref 150.0–400.0)
RBC: 5.29 Mil/uL — AB (ref 3.87–5.11)
RDW: 13.4 % (ref 11.5–14.6)
WBC: 6 10*3/uL (ref 4.5–10.5)

## 2013-12-10 LAB — HEPATIC FUNCTION PANEL
ALK PHOS: 77 U/L (ref 39–117)
ALT: 151 U/L — ABNORMAL HIGH (ref 0–35)
AST: 125 U/L — AB (ref 0–37)
Albumin: 4.2 g/dL (ref 3.5–5.2)
BILIRUBIN DIRECT: 0.1 mg/dL (ref 0.0–0.3)
BILIRUBIN TOTAL: 0.7 mg/dL (ref 0.3–1.2)
Total Protein: 8 g/dL (ref 6.0–8.3)

## 2013-12-10 LAB — LIPID PANEL
CHOL/HDL RATIO: 5
Cholesterol: 159 mg/dL (ref 0–200)
HDL: 33.5 mg/dL — ABNORMAL LOW (ref >39.00)
Triglycerides: 272 mg/dL — ABNORMAL HIGH (ref 0.0–149.0)
VLDL: 54.4 mg/dL — AB (ref 0.0–40.0)

## 2013-12-10 LAB — BASIC METABOLIC PANEL
BUN: 18 mg/dL (ref 6–23)
CALCIUM: 9.5 mg/dL (ref 8.4–10.5)
CHLORIDE: 99 meq/L (ref 96–112)
CO2: 28 meq/L (ref 19–32)
CREATININE: 1 mg/dL (ref 0.4–1.2)
GFR: 62.4 mL/min (ref >60.00)
GLUCOSE: 116 mg/dL — AB (ref 70–99)
Potassium: 4 mEq/L (ref 3.5–5.1)
Sodium: 135 mEq/L (ref 135–145)

## 2013-12-10 LAB — TSH: TSH: 1.55 u[IU]/mL (ref 0.35–5.50)

## 2013-12-10 LAB — H. PYLORI ANTIBODY, IGG: H PYLORI IGG: NEGATIVE

## 2013-12-10 LAB — LDL CHOLESTEROL, DIRECT: Direct LDL: 72.9 mg/dL

## 2013-12-10 MED ORDER — PROMETHAZINE-DM 6.25-15 MG/5ML PO SYRP: 5.0000 mL | ORAL_SOLUTION | Freq: Four times a day (QID) | ORAL | Status: DC | PRN

## 2013-12-10 MED ORDER — OMEPRAZOLE 20 MG PO CPDR: 20.0000 mg | DELAYED_RELEASE_CAPSULE | Freq: Every day | ORAL | Status: DC

## 2013-12-10 NOTE — Progress Notes (Signed)
Pre visit review using our clinic review tool, if applicable. No additional management support is needed unless otherwise documented below in the visit note. 

## 2013-12-10 NOTE — Assessment & Plan Note (Signed)
New.  No evidence of bacterial infxn on PE, no need for abx.  Cough meds prn.  Reviewed supportive care and red flags that should prompt return.  Pt expressed understanding and is in agreement w/ plan.

## 2013-12-10 NOTE — Progress Notes (Signed)
   Subjective:    Patient ID: [name] [name], female    DOB: [birth year], 63 y.o.   MRN: [id number]  Cough Associated symptoms include a fever.  Fever  Associated symptoms include coughing.   URI- pt reports she was sick for 2 weeks in January, got better, and again has been sick x4 days.  Tm 101.  + cough.  No sinus pain/pressure.  + HA.  No ear pain.  + nausea.  No vomiting/diarrhea.  + body aches.  No sore throat.  Having L sided abd pain- occurs w/ eating.  Hyperlipidemia- pt wants to have labs done.  Not currently on meds.  GERD- pt on Zantac but reports increased pain, particularly w/ eating.  Night sweats- pt reports this has been ongoing for 'months'.  Denies fevers.   Review of Systems  Constitutional: Positive for fever.  Respiratory: Positive for cough.    For ROS see HPI     Objective:   Physical Exam  Constitutional: She appears well-developed and well-nourished. No distress.  HENT:  Head: Normocephalic and atraumatic.  TMs normal bilaterally Mild nasal congestion Throat w/out erythema, edema, or exudate  Eyes: Conjunctivae and EOM are normal. Pupils are equal, round, and reactive to light.  Neck: Normal range of motion. Neck supple.  Cardiovascular: Normal rate, regular rhythm, normal heart sounds and intact distal pulses.   No murmur heard. Pulmonary/Chest: Effort normal and breath sounds normal. No respiratory distress. She has no wheezes.  + hacking cough  Lymphadenopathy:    She has no cervical adenopathy.          Assessment & Plan:

## 2013-12-10 NOTE — Assessment & Plan Note (Signed)
Chronic problem. Pt has refused meds in the past.  Wants to repeat labs today.

## 2013-12-10 NOTE — Patient Instructions (Signed)
Follow up as needed Start the promethazine cough syrup as needed for cough Mucinex to thin your chest congestion STOP the Zantac START the Omeprazole We'll notify you of your lab results and make any changes if needed Call with any questions or concerns Hang in there!!!

## 2013-12-10 NOTE — Assessment & Plan Note (Signed)
New to provider, ongoing for pt.  Check CBC to r/o infxn, TSH to r/o thyroid abnormality.  If sxs continue, encouraged pt to call so we could do additional work up.  Will follow.

## 2013-12-10 NOTE — Assessment & Plan Note (Signed)
Deteriorated.  Stop Zantac.  Start PPI.  Check H pylori.  Will follow.

## 2013-12-12 ENCOUNTER — Ambulatory Visit: Payer: BC Managed Care – PPO

## 2013-12-12 ENCOUNTER — Other Ambulatory Visit: Payer: Self-pay | Admitting: Family Medicine

## 2013-12-12 DIAGNOSIS — R7309 Other abnormal glucose: Secondary | ICD-10-CM

## 2013-12-12 DIAGNOSIS — R7989 Other specified abnormal findings of blood chemistry: Secondary | ICD-10-CM

## 2013-12-12 DIAGNOSIS — R945 Abnormal results of liver function studies: Principal | ICD-10-CM

## 2013-12-12 LAB — HEMOGLOBIN A1C: Hgb A1c MFr Bld: 6.5 % (ref 4.6–6.5)

## 2013-12-16 ENCOUNTER — Ambulatory Visit (HOSPITAL_BASED_OUTPATIENT_CLINIC_OR_DEPARTMENT_OTHER): Payer: BC Managed Care – PPO

## 2013-12-17 ENCOUNTER — Ambulatory Visit (HOSPITAL_BASED_OUTPATIENT_CLINIC_OR_DEPARTMENT_OTHER): Payer: BC Managed Care – PPO

## 2013-12-22 ENCOUNTER — Ambulatory Visit (HOSPITAL_BASED_OUTPATIENT_CLINIC_OR_DEPARTMENT_OTHER): Payer: BC Managed Care – PPO

## 2014-01-07 ENCOUNTER — Encounter: Payer: Self-pay | Admitting: General Practice

## 2014-01-07 ENCOUNTER — Ambulatory Visit (HOSPITAL_BASED_OUTPATIENT_CLINIC_OR_DEPARTMENT_OTHER)
Admission: RE | Admit: 2014-01-07 | Discharge: 2014-01-07 | Disposition: A | Discharge status: Home / Self Care | Payer: BC Managed Care – PPO | Source: Ambulatory Visit | Attending: Family Medicine | Admitting: Family Medicine

## 2014-01-07 DIAGNOSIS — R945 Abnormal results of liver function studies: Secondary | ICD-10-CM

## 2014-01-07 DIAGNOSIS — R7989 Other specified abnormal findings of blood chemistry: Secondary | ICD-10-CM | POA: Insufficient documentation

## 2014-01-12 ENCOUNTER — Ambulatory Visit: Payer: BC Managed Care – PPO | Admitting: Gastroenterology

## 2014-01-29 ENCOUNTER — Other Ambulatory Visit: Payer: Self-pay | Admitting: Family Medicine

## 2014-01-29 NOTE — Telephone Encounter (Signed)
Med filled.  

## 2014-02-24 ENCOUNTER — Ambulatory Visit: Payer: BC Managed Care – PPO | Admitting: Gastroenterology

## 2014-03-14 ENCOUNTER — Encounter: Payer: Self-pay | Admitting: Gastroenterology

## 2014-04-22 ENCOUNTER — Ambulatory Visit: Payer: BC Managed Care – PPO | Admitting: Gastroenterology

## 2014-05-26 ENCOUNTER — Encounter: Payer: Self-pay | Admitting: Medical

## 2014-05-26 ENCOUNTER — Ambulatory Visit (INDEPENDENT_AMBULATORY_CARE_PROVIDER_SITE_OTHER): Payer: BC Managed Care – PPO | Admitting: Medical

## 2014-05-26 ENCOUNTER — Ambulatory Visit (HOSPITAL_BASED_OUTPATIENT_CLINIC_OR_DEPARTMENT_OTHER)
Admission: RE | Admit: 2014-05-26 | Discharge: 2014-05-26 | Disposition: A | Discharge status: Home / Self Care | Payer: BC Managed Care – PPO | Source: Ambulatory Visit | Attending: Medical | Admitting: Medical

## 2014-05-26 VITALS — BP 98/68 | HR 87 | Temp 97.8°F | Wt 148.0 lb

## 2014-05-26 DIAGNOSIS — M25562 Pain in left knee: Secondary | ICD-10-CM

## 2014-05-26 DIAGNOSIS — M25569 Pain in unspecified knee: Secondary | ICD-10-CM

## 2014-05-26 MED ORDER — DICLOFENAC SODIUM 75 MG PO TBEC: 75.0000 mg | DELAYED_RELEASE_TABLET | Freq: Two times a day (BID) | ORAL | Status: DC

## 2014-05-26 NOTE — Assessment & Plan Note (Signed)
Rx diclofenac. DC otc nsaids. Xray of knee today. If symptom worsen or change notify us. If xray negative then consider internal derangement. Based on location of tenderness one exam possilble meniscus injury.

## 2014-05-26 NOTE — Patient Instructions (Signed)
For your left knee pain I am prescribing diclofenac prescription. Stop otc nsaids. I also want you to get xray of your left knee. Notify us if you pain worsens or changes. Follow up in 7 days any persisting symptoms or as needed. If your xray is negative and your pain persists then may consider evaluation of meniscus, ligaments and tendons with ortho or by MRI.

## 2014-05-26 NOTE — Progress Notes (Signed)
   Subjective:    Patient ID: [name] [name], female    DOB: [birth year], 62 y.o.   MRN: [id number]  HPI  Pt in with some left knee pain. Pain hurting for one week. No injury or any fall. She did dance at wedding but no injury. No history of knee pain. Tried ibuprofen and alleve and did not help much. No other arthralgias. Pt does not want any controlled medication type. No other arthralgias.   Review of Systems  Constitutional: Negative for fever, chills and fatigue.  HENT: Negative.   Respiratory: Negative for cough, choking, chest tightness and wheezing.   Cardiovascular: Negative for chest pain and palpitations.  Gastrointestinal: Negative.   Musculoskeletal:       Lt knee pain.  Skin: Negative for color change, pallor and rash.  Hematological: Negative for adenopathy. Does not bruise/bleed easily.       Objective:   Physical Exam  Constitutional: She is oriented to person, place, and time. She appears well-developed and well-nourished. No distress.  HENT:  Head: Normocephalic and atraumatic.  Cardiovascular: Normal rate, regular rhythm and normal heart sounds.   Pulmonary/Chest: Effort normal and breath sounds normal. No respiratory distress. She has no wheezes. She has no rales. She exhibits no tenderness.  Musculoskeletal: Normal range of motion. She exhibits tenderness. She exhibits no edema.  Lt knee- Full rom. No pain on flexion or extension. Varicose veins over and around knee but they are not tender.(Hx of and not new). On palpation over tibial plateau medial aspect point tender. Knee not swollen or warm. No instablity. Negative homans signs. No pretibial edema.  Neurological: She is alert and oriented to person, place, and time.  Skin: She is not diaphoretic.          Assessment & Plan:

## 2014-05-26 NOTE — Progress Notes (Signed)
Pre visit review using our clinic review tool, if applicable. No additional management support is needed unless otherwise documented below in the visit note. 

## 2014-05-28 ENCOUNTER — Telehealth: Payer: Self-pay | Admitting: Family Medicine

## 2014-05-28 DIAGNOSIS — M25562 Pain in left knee: Secondary | ICD-10-CM

## 2014-05-28 NOTE — Telephone Encounter (Addendum)
Caller name: Janea Schwenn  Relation to pt: self  Call back number: [phone] Pharmacy: Reason for call: pt was last seen 05/26/14. Pain is persistent in left knee would like a referral to an Orthopedic.

## 2014-05-29 NOTE — Telephone Encounter (Signed)
Ok for referral to orthopedics- please send these requests to the provider who saw the pt (in this case, Percell Miller)

## 2014-05-29 NOTE — Telephone Encounter (Signed)
Ok thank you 

## 2014-05-29 NOTE — Telephone Encounter (Signed)
Referral placed.   Please make sure that if a pt was seen by another provider (in this case Percell Miller) that is who gets the phone note.

## 2014-06-03 ENCOUNTER — Ambulatory Visit: Payer: BC Managed Care – PPO | Admitting: Medical

## 2014-06-17 ENCOUNTER — Encounter: Payer: Self-pay | Admitting: Gastroenterology

## 2014-06-26 ENCOUNTER — Telehealth: Payer: Self-pay | Admitting: Family Medicine

## 2014-06-26 MED ORDER — FLUOXETINE HCL 20 MG PO TABS: ORAL_TABLET | ORAL | Status: DC

## 2014-06-26 NOTE — Telephone Encounter (Signed)
Med filled.  

## 2014-06-26 NOTE — Telephone Encounter (Signed)
Caller name:  marilyn, nihiser Relation to AL:PFXT Call back [place]: [facility]  Reason for call: pt is needing new rx FLUoxetine (PROZAC) 20 MG tablet

## 2014-07-01 ENCOUNTER — Other Ambulatory Visit: Payer: Self-pay

## 2014-07-01 ENCOUNTER — Ambulatory Visit: Payer: BC Managed Care – PPO | Admitting: Gastroenterology

## 2014-07-01 MED ORDER — DICLOFENAC SODIUM 75 MG PO TBEC: 75.0000 mg | DELAYED_RELEASE_TABLET | Freq: Two times a day (BID) | ORAL | Status: DC

## 2014-08-17 ENCOUNTER — Encounter: Payer: Self-pay | Admitting: Medical

## 2014-08-29 IMAGING — US US ABDOMEN COMPLETE
1 series · 13 of 25 positions shown · non-contrast
Comparison: US ABDOMEN COMPLETE dated 12/13/2012

CLINICAL DATA: Elevated hepatic function studies ; hepatic
steatosis

EXAM:
ULTRASOUND ABDOMEN COMPLETE

[Series 1: us abdomen complete · 0.32mm/px · 13 of 75 slices shown]
[im 1/75]
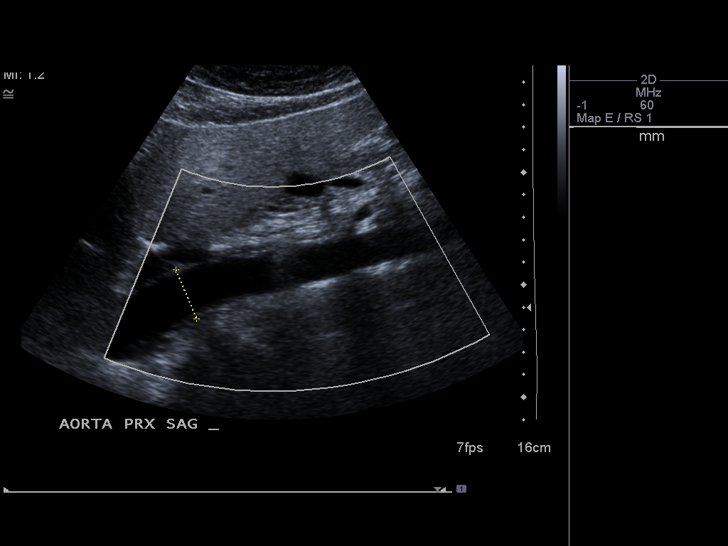
[im 7/75]
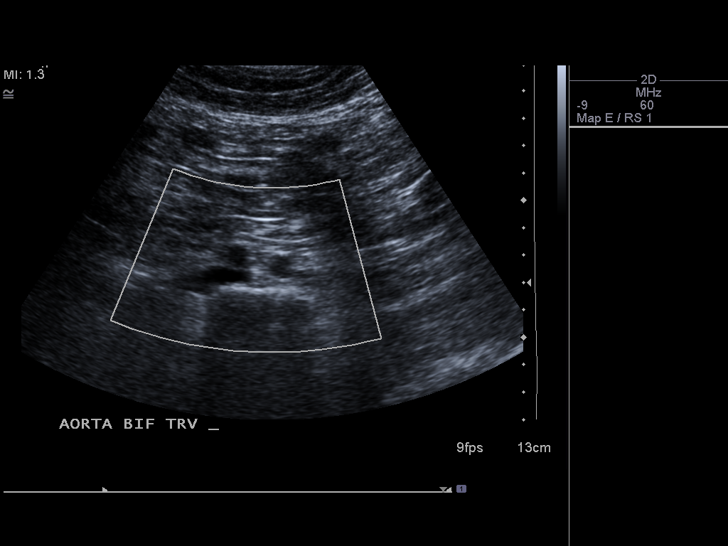
[im 13/75]
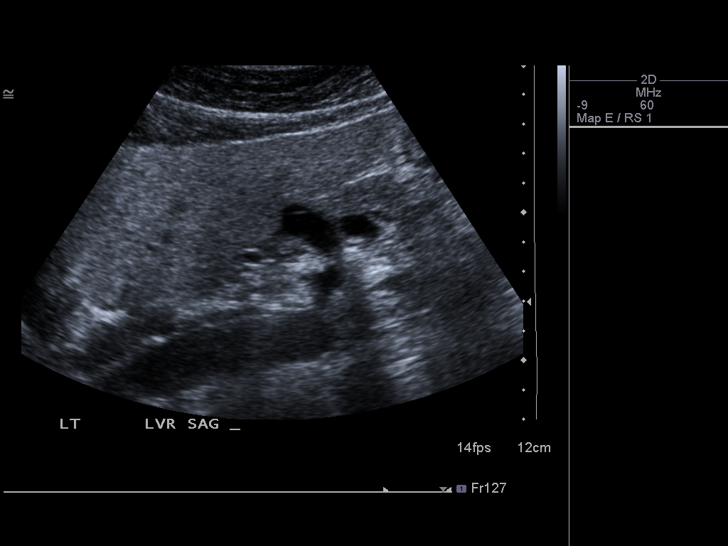
[im 19/75]
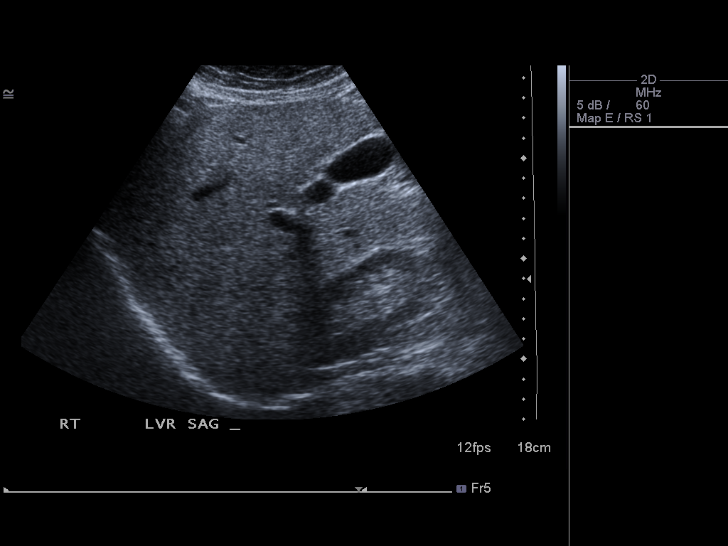
[im 25/75]
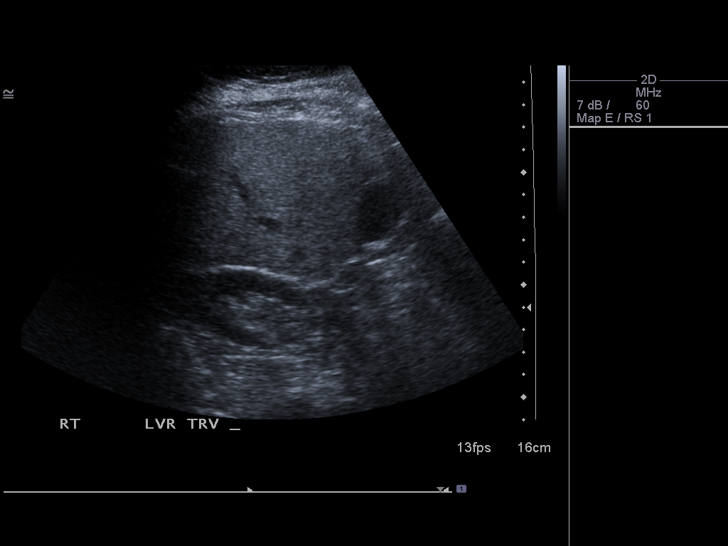
[im 31/75]
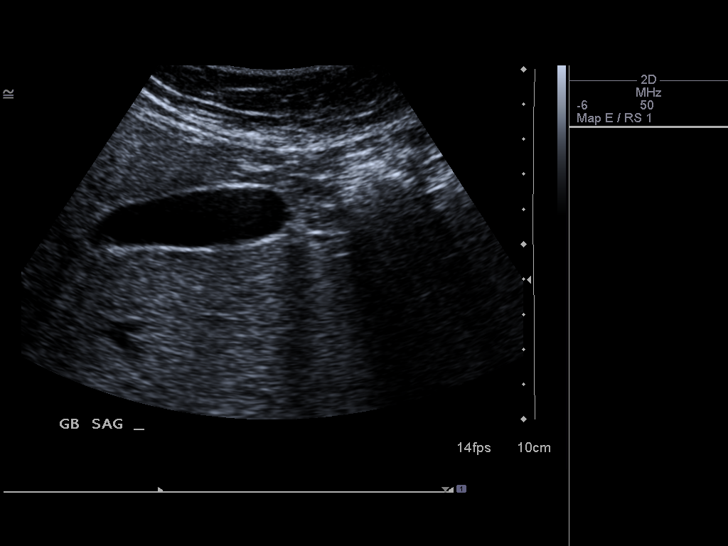
[im 38/75]
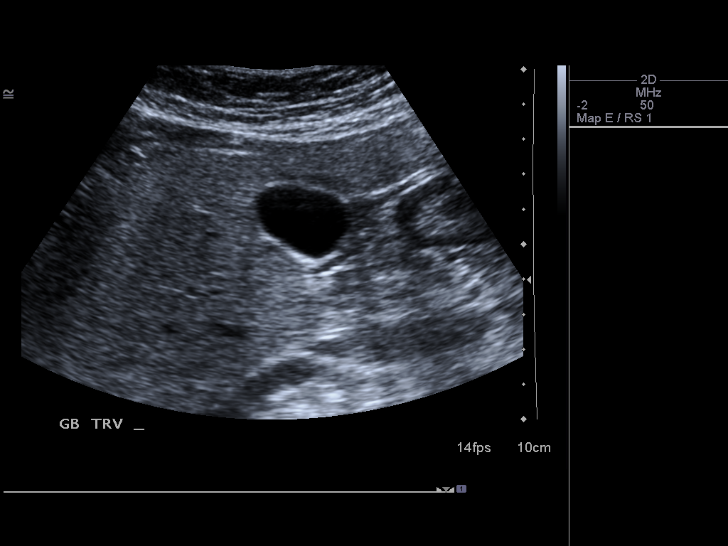
[im 44/75]
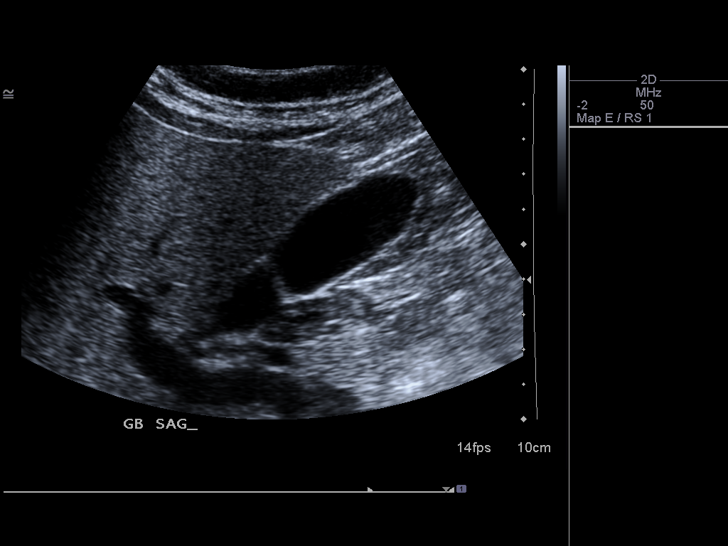
[im 50/75]
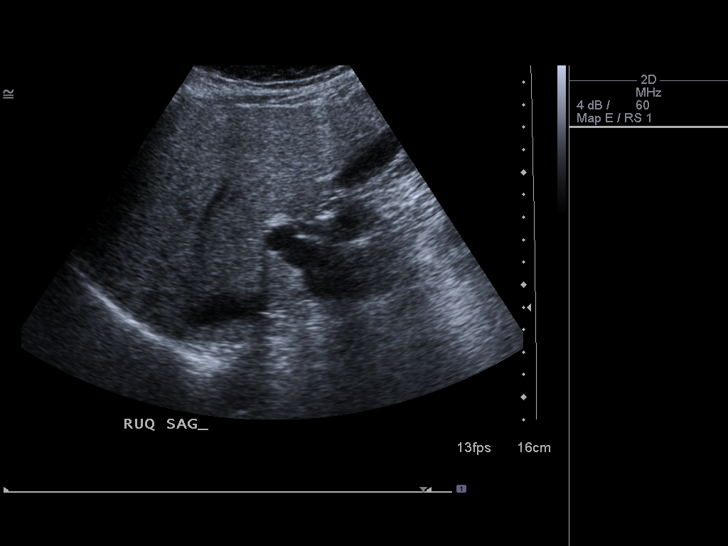
[im 56/75]
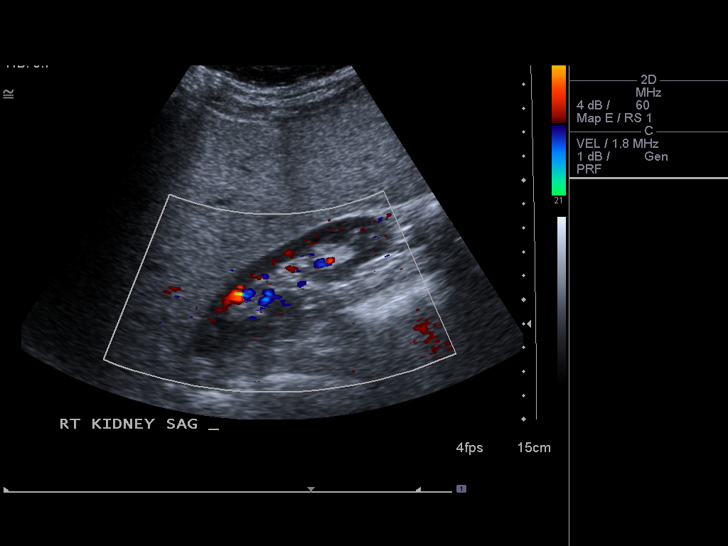
[im 62/75]
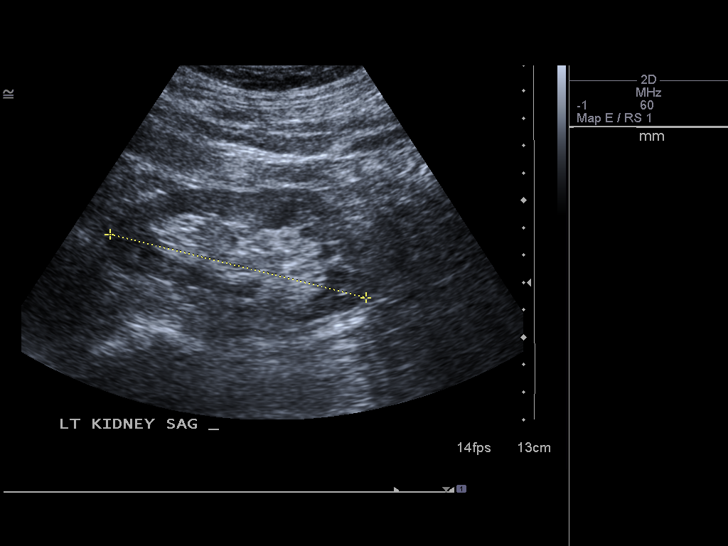
[im 68/75]
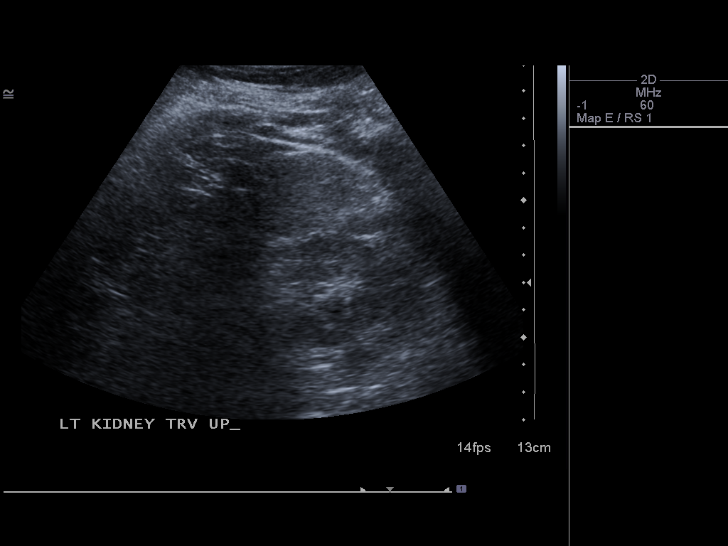
[im 75/75]
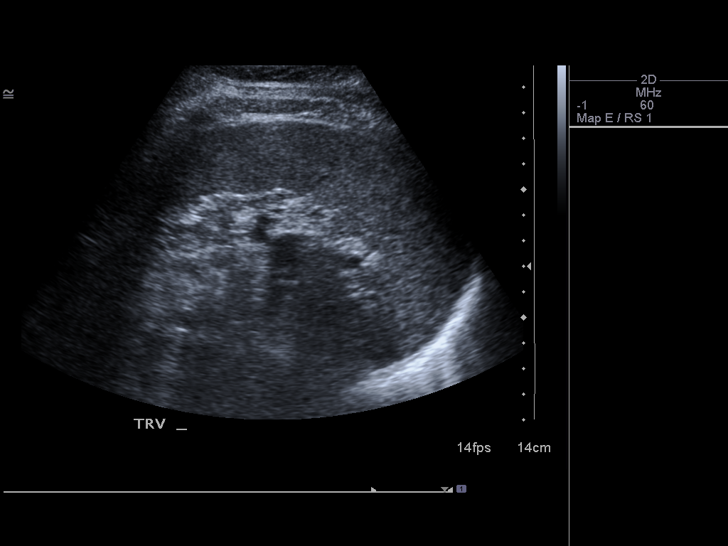

[13 of 25 positions shown; findings below may reference images not displayed]

FINDINGS: Gallbladder:

The gallbladder is adequately distended and exhibits no evidence of
stones, wall thickening, or pericholecystic fluid. A prominent fold
is visible and stable. There is no positive sonographic Murphy's
sign.

Common bile duct:

Diameter: 3 mm

Liver:

The liver demonstrates increased echotexture consistent with known
steatosis. There is no focal mass nor ductal dilation. Portal venous
flow is normal in direction toward the liver.

IVC:

Evaluation of the IVC was limited by bowel gas.

Pancreas:

Evaluation of the pancreas was limited by bowel gas. A small portion
of the pancreatic body is visible and it appears normal.

Spleen:

Size and appearance within normal limits.

Right Kidney:

Length: 10.3 cm. Echogenicity within normal limits. No mass or
hydronephrosis visualized.

Left Kidney:

Length: 9.6 cm. Echogenicity within normal limits. No mass or
hydronephrosis visualized.

Abdominal aorta:

Bowel gas limits evaluation of the abdominal aorta. The maximal
diameter visible is 2.4 cm proximally.

Other findings:

No ascites is demonstrated 0
IMPRESSION: 1. No gallstones are demonstrated. There is no sonographic evidence
of acute cholecystitis. The common bile duct is normal in
appearance.
2. The liver demonstrates increased echotexture consistent with
fatty infiltration. No focal abnormality is demonstrated.
3. The kidneys exhibit no evidence of obstruction nor other acute
abnormalities.

## 2014-11-18 ENCOUNTER — Telehealth: Payer: Self-pay | Admitting: Family Medicine

## 2014-11-18 DIAGNOSIS — T7020XA Unspecified effects of high altitude, initial encounter: Secondary | ICD-10-CM

## 2014-11-18 DIAGNOSIS — T7029XA Other effects of high altitude, initial encounter: Principal | ICD-10-CM

## 2014-11-18 MED ORDER — FLUOXETINE HCL 20 MG PO TABS: ORAL_TABLET | ORAL | Status: DC

## 2014-11-18 MED ORDER — ACETAZOLAMIDE 125 MG PO TABS: 125.0000 mg | ORAL_TABLET | Freq: Two times a day (BID) | ORAL | Status: DC

## 2014-11-18 NOTE — Telephone Encounter (Signed)
Ok to refill both but needs to schedule appt for CPE

## 2014-11-18 NOTE — Telephone Encounter (Signed)
Caller name: kelbi Relation to pt: self Call back number: [phone] Pharmacy: [facility] on [address]  Reason for call:   Patient states that she is going to [place] and needs refill of acetaZOLAMIDE (DIAMOX) and prozac.

## 2014-11-18 NOTE — Telephone Encounter (Signed)
meds filled

## 2014-11-18 NOTE — Telephone Encounter (Signed)
Last OV 11/20/13 Diamox last filled in 2014 #20 with 0 and prozac last filled 06-26-14 #30 with 3  Ok to fill both? No upcoming appts

## 2014-11-20 ENCOUNTER — Encounter: Payer: Self-pay | Admitting: Medical

## 2014-11-20 ENCOUNTER — Ambulatory Visit (INDEPENDENT_AMBULATORY_CARE_PROVIDER_SITE_OTHER): Payer: BLUE CROSS/BLUE SHIELD | Admitting: Medical

## 2014-11-20 VITALS — BP 99/70 | HR 97 | Temp 98.3°F

## 2014-11-20 DIAGNOSIS — R509 Fever, unspecified: Secondary | ICD-10-CM

## 2014-11-20 DIAGNOSIS — K529 Noninfective gastroenteritis and colitis, unspecified: Secondary | ICD-10-CM | POA: Insufficient documentation

## 2014-11-20 LAB — CBC WITH DIFFERENTIAL/PLATELET
Basophils Absolute: 0 10*3/uL (ref 0.0–0.1)
Basophils Relative: 0 % (ref 0–1)
EOS ABS: 0 10*3/uL (ref 0.0–0.7)
EOS PCT: 0 % (ref 0–5)
HCT: 42.1 % (ref 36.0–46.0)
Hemoglobin: 14.7 g/dL (ref 12.0–15.0)
Lymphocytes Relative: 4 % — ABNORMAL LOW (ref 12–46)
Lymphs Abs: 0.7 10*3/uL (ref 0.7–4.0)
MCH: 29.6 pg (ref 26.0–34.0)
MCHC: 34.9 g/dL (ref 30.0–36.0)
MCV: 84.7 fL (ref 78.0–100.0)
MPV: 10.3 fL (ref 8.6–12.4)
Monocytes Absolute: 1.2 10*3/uL — ABNORMAL HIGH (ref 0.1–1.0)
Monocytes Relative: 7 % (ref 3–12)
NEUTROS PCT: 89 % — AB (ref 43–77)
Neutro Abs: 14.7 10*3/uL — ABNORMAL HIGH (ref 1.7–7.7)
PLATELETS: 211 10*3/uL (ref 150–400)
RBC: 4.97 MIL/uL (ref 3.87–5.11)
RDW: 13.4 % (ref 11.5–15.5)
WBC: 16.5 10*3/uL — AB (ref 4.0–10.5)

## 2014-11-20 LAB — COMPREHENSIVE METABOLIC PANEL
ALT: 51 U/L — AB (ref 0–35)
AST: 36 U/L (ref 0–37)
Albumin: 4.3 g/dL (ref 3.5–5.2)
Alkaline Phosphatase: 90 U/L (ref 39–117)
BUN: 21 mg/dL (ref 6–23)
CALCIUM: 9.4 mg/dL (ref 8.4–10.5)
CO2: 23 mEq/L (ref 19–32)
Chloride: 97 mEq/L (ref 96–112)
Creat: 1.1 mg/dL (ref 0.50–1.10)
GLUCOSE: 201 mg/dL — AB (ref 70–99)
POTASSIUM: 3.8 meq/L (ref 3.5–5.3)
Sodium: 133 mEq/L — ABNORMAL LOW (ref 135–145)
Total Bilirubin: 1.2 mg/dL (ref 0.2–1.2)
Total Protein: 7.1 g/dL (ref 6.0–8.3)

## 2014-11-20 LAB — POCT INFLUENZA A/B
INFLUENZA A, POC: NEGATIVE
Influenza B, POC: NEGATIVE

## 2014-11-20 MED ORDER — SULFAMETHOXAZOLE-TRIMETHOPRIM 800-160 MG PO TABS: 1.0000 | ORAL_TABLET | Freq: Two times a day (BID) | ORAL | Status: DC

## 2014-11-20 NOTE — Assessment & Plan Note (Signed)
You have likely viral gastroenteritis vs possible food poisining. I want you to rest, hydrate with propel, follow bland diet guidlines and take tylenol for fever.  Take imodium otc for diarrhea. For nausea of vomiting, I offered zofran but you declined.   There is some chance that your have a bacterial infection so I do want you to get stool panel kit  if diarrhea/loose stools restart and turn that in as soon as possible. Turning stool panel kit earlier will provide [place] with quicker result of studies and more informed decision if antibiotics are needed.   Please get labs today.   You want antibiotic and declined cipro. I don't think antibiotic indicated unless you get severe recurrent diarrhea with fever. In that event can start antibiotic but remember stool studies important to collect and return.  Follow up in 7 days or as needed.

## 2014-11-20 NOTE — Patient Instructions (Signed)
Gastroenteritis You have likely viral gastroenteritis vs possible food poisining. I want you to rest, hydrate with propel, follow bland diet guidlines and take tylenol for fever.  Take imodium otc for diarrhea. For nausea of vomiting, I offered zofran but you declined.   There is some chance that your have a bacterial infection so I do want you to get stool panel kit  if diarrhea/loose stools restart and turn that in as soon as possible. Turning stool panel kit earlier will provide [place] with quicker result of studies and more informed decision if antibiotics are needed.   Please get labs today.   You want antibiotic and declined cipro. I don't think antibiotic indicated unless you get severe recurrent diarrhea with fever. In that event can start antibiotic but remember stool studies important to collect and return.  Follow up in 7 days or as needed.

## 2014-11-20 NOTE — Progress Notes (Signed)
Pre visit review using our clinic review tool, if applicable. No additional management support is needed unless otherwise documented below in the visit note. 

## 2014-11-20 NOTE — Addendum Note (Signed)
Addended by: Peggyann Shoals on: 11/20/2014 02:46 PM   Modules accepted: Orders

## 2014-11-20 NOTE — Progress Notes (Signed)
Subjective:    Patient ID: [name] [name], female    DOB: April 28, 1951, 64 y.o.   MRN: [id number]  HPI   Pt in today reporting  1 day with 5 loose stools and upset stomach. On review Wednesday night ate some left overs but she can't remember what she ate. After she ate food upset stomach, loose stool and vomited a lot on Wednesday.  Thursday vomited 5 times and had fever of 102. Report contact with contact with no  persons with GI illness. Recent no antibiotics. Reports no history of an inflammatory bowel diseases. Reports approximately 5 number of stools a day.  Stomach cramps. Pt has tried otc treatments.   Pt has felt fever yesterday and today. 102 fever yesterday. This am 103.  Today no vomiting. No loose stools today.  Pt ate one egg this am.      Review of Systems  Constitutional: Positive for fever and fatigue. Negative for chills.       See hop.  Respiratory: Negative for cough, choking and wheezing.   Cardiovascular: Negative for chest pain and palpitations.  Gastrointestinal: Positive for nausea, vomiting and diarrhea. Negative for abdominal pain, constipation, blood in stool, abdominal distention, anal bleeding and rectal pain.       See hop for full descript. Most of symptoms cleared today but she is tired.  Genitourinary: Negative.   Musculoskeletal: Negative for back pain.  Skin: Negative for rash.  Neurological: Negative for dizziness, syncope, speech difficulty, weakness, light-headedness and numbness.  Hematological: Negative for adenopathy.  Psychiatric/Behavioral: Negative for confusion and sleep disturbance.   Past Medical History  Diagnosis Date  . GERD (gastroesophageal reflux disease)   . Hyperlipidemia   . Diverticulosis   . Achalasia   . Dysphagia   . Anxiety and depression     History   Social History  . Marital Status: Married    Spouse Name: N/A    Number of Children: N/A  . Years of Education: N/A   Occupational History  . Not on file.    Social History Main Topics  . Smoking status: Former Research scientist (life sciences)  . Smokeless tobacco: Never Used  . Alcohol Use: No  . Drug Use: No  . Sexual Activity: No   Other Topics Concern  . Not on file   Social History Narrative    Past Surgical History  Procedure Laterality Date  . Laparoscopic myotomy      Family History  Problem Relation Age of Onset  . Colon cancer Neg Hx   . Hypertension Mother   . Heart disease Father     No Known Allergies  Current Outpatient Prescriptions on File Prior to Visit  Medication Sig Dispense Refill  . diclofenac (VOLTAREN) 75 MG EC tablet Take 1 tablet (75 mg total) by mouth 2 (two) times daily. 20 tablet 0  . FLUoxetine (PROZAC) 20 MG tablet TAKE 1 TABLET (20 MG TOTAL) BY MOUTH DAILY. 30 tablet 3  . ibuprofen (ADVIL,MOTRIN) 200 MG tablet Take 200 mg by mouth every 6 (six) hours as needed. For pain    . omeprazole (PRILOSEC) 20 MG capsule Take 1 capsule (20 mg total) by mouth daily. 30 capsule 3   No current facility-administered medications on file prior to visit.    BP 99/70 mmHg  Pulse 97  Temp(Src) 98.3 F (36.8 C) (Oral)  SpO2 94%      Objective:   Physical Exam  General Appearance- Not in acute distress.  HEENT Eyes- Scleraeral/Conjuntiva-bilat- Not Yellow.  Mouth & Throat- Normal.  Chest and Lung Exam Auscultation: Breath sounds:-Normal. Adventitious sounds:- No Adventitious sounds.  Cardiovascular Auscultation:Rythm - Regular. Heart Sounds -Normal heart sounds.  Abdomen Inspection:-Inspection Normal.  Palpation/Perucssion: Palpation and Percussion of the abdomen reveal- Non Tender, No Rebound tenderness, No rigidity(Guarding) and No Palpable abdominal masses.  Liver:-Normal.  Spleen:- Normal.   Skin- moist. No tenting      Assessment & Plan:

## 2014-11-22 ENCOUNTER — Telehealth: Payer: Self-pay | Admitting: Medical

## 2014-11-22 DIAGNOSIS — D72829 Elevated white blood cell count, unspecified: Secondary | ICD-10-CM

## 2014-11-22 NOTE — Telephone Encounter (Signed)
Repeat cbc for elevated wbc.

## 2014-11-23 ENCOUNTER — Telehealth: Payer: Self-pay | Admitting: Family Medicine

## 2014-11-23 NOTE — Telephone Encounter (Signed)
Caller name:Gerri Lennie Odor Relationship to patient:Self Can be [phone]   Reason for call: PT states retuning Karen's call re : lab results-

## 2014-11-23 NOTE — Telephone Encounter (Signed)
Reviewed labs with patient who states that she has not had any diarrhea episodes only nausea and vomiting. She states that it has decrease over the weekend. She is feeling better but still is fatigued. She did not take the antibiotic, Advised of repeat CBC. Appointment scheduled.

## 2014-11-23 NOTE — Telephone Encounter (Signed)
Spoke with patient regarding results. See additional phone notes.

## 2014-11-23 NOTE — Telephone Encounter (Signed)
Left message for patient to call back regarding repeat cbc (stat)

## 2014-11-24 ENCOUNTER — Other Ambulatory Visit (INDEPENDENT_AMBULATORY_CARE_PROVIDER_SITE_OTHER): Payer: BLUE CROSS/BLUE SHIELD

## 2014-11-24 ENCOUNTER — Telehealth: Payer: Self-pay | Admitting: Medical

## 2014-11-24 DIAGNOSIS — D72829 Elevated white blood cell count, unspecified: Secondary | ICD-10-CM

## 2014-11-24 LAB — CBC WITH DIFFERENTIAL/PLATELET
BASOS ABS: 0 10*3/uL (ref 0.0–0.1)
Basophils Relative: 0 % (ref 0.0–3.0)
EOS ABS: 0.2 10*3/uL (ref 0.0–0.7)
Eosinophils Relative: 2 % (ref 0.0–5.0)
HCT: 39.7 % (ref 36.0–46.0)
HEMOGLOBIN: 14 g/dL (ref 12.0–15.0)
LYMPHS ABS: 1.5 10*3/uL (ref 0.7–4.0)
LYMPHS PCT: 12.3 % (ref 12.0–46.0)
MCHC: 35.2 g/dL (ref 30.0–36.0)
MCV: 83.4 fl (ref 78.0–100.0)
MONO ABS: 1 10*3/uL (ref 0.1–1.0)
Monocytes Relative: 8.4 % (ref 3.0–12.0)
Neutro Abs: 9.4 10*3/uL — ABNORMAL HIGH (ref 1.4–7.7)
Neutrophils Relative %: 77.3 % — ABNORMAL HIGH (ref 43.0–77.0)
Platelets: 280 10*3/uL (ref 150.0–400.0)
RBC: 4.76 Mil/uL (ref 3.87–5.11)
RDW: 12.6 % (ref 11.5–15.5)
WBC: 12.1 10*3/uL — ABNORMAL HIGH (ref 4.0–10.5)

## 2014-11-24 NOTE — Telephone Encounter (Signed)
Pt feels better. No fever, no chills, no diarrhea and getting more energy. She never took antibiotics. I am adding a1-c to her labs done today. She had elevated bs on cmp other day.  Also pt made aware of wbc improvement. She declines repeat since clinically she is feeling a lot better.

## 2014-11-25 ENCOUNTER — Other Ambulatory Visit: Payer: BLUE CROSS/BLUE SHIELD

## 2014-11-25 ENCOUNTER — Other Ambulatory Visit (INDEPENDENT_AMBULATORY_CARE_PROVIDER_SITE_OTHER): Payer: BLUE CROSS/BLUE SHIELD

## 2014-11-25 DIAGNOSIS — R739 Hyperglycemia, unspecified: Secondary | ICD-10-CM

## 2014-11-25 LAB — HEMOGLOBIN A1C: Hgb A1c MFr Bld: 6.6 % — ABNORMAL HIGH (ref 4.6–6.5)

## 2014-11-26 ENCOUNTER — Telehealth: Payer: Self-pay | Admitting: *Deleted

## 2014-11-26 NOTE — Telephone Encounter (Signed)
FYI.  Pt came in on Tues. (11/24/14) for labs and she brought back the stool culture kit.  Pt stated that she did not need to do it because she was not having diarrhea.//AB/CMA

## 2014-12-01 ENCOUNTER — Ambulatory Visit (INDEPENDENT_AMBULATORY_CARE_PROVIDER_SITE_OTHER): Payer: BLUE CROSS/BLUE SHIELD | Admitting: Physician Assistant

## 2014-12-01 ENCOUNTER — Encounter: Payer: Self-pay | Admitting: Physician Assistant

## 2014-12-01 VITALS — BP 100/58 | HR 68 | Temp 98.3°F | Resp 16 | Ht 60.5 in | Wt 145.0 lb

## 2014-12-01 DIAGNOSIS — R1031 Right lower quadrant pain: Secondary | ICD-10-CM | POA: Insufficient documentation

## 2014-12-01 MED ORDER — CIPROFLOXACIN HCL 500 MG PO TABS: 500.0000 mg | ORAL_TABLET | Freq: Two times a day (BID) | ORAL | Status: DC

## 2014-12-01 NOTE — Patient Instructions (Signed)
Please go to the lab for blood work.  Then stop by the front desk to speak with Marj or Delsa Sale about scheduling your CT scan.  Take the antibiotic as directed with food.  Stay well hydrated.  If symptoms acutely worsen while we are working up this issue, please go to the ER.

## 2014-12-01 NOTE — Assessment & Plan Note (Signed)
Unclear etiology. Urine dip unremarkable. No palpable mass.  Concern for infectious etiology giving intermittent fevers and recent elevation in WBC.  Rx Cipro 500 mg BID.  Will repeat labs today and obtain STAT CT abdomen/pelvis.  Alarm signs/symptoms discussed with patient.  He is to call 911 or go to ER if these occur.  Otherwise follow-up will be based on results.

## 2014-12-01 NOTE — Progress Notes (Signed)
Patient presents to clinic today c/o intermittent RLQ and inguinal pain x 1 week associated with intermittent fevers and chills.  Denies nausea or vomiting.  Denies constipation or diarrhea.  Endorses good urinary and bowel output.  Denies tenesmus, melena, hematochezia or UTI symptoms.  Was seen a couple of weeks ago for possible food poisoning with elevation in WBC w left shift.  Was not started on antibiotic as symptoms improved. Patient endorses history of diverticulitis.  Denies recent travel or sick contact.  Past Medical History  Diagnosis Date  . GERD (gastroesophageal reflux disease)   . Hyperlipidemia   . Diverticulosis   . Achalasia   . Dysphagia   . Anxiety and depression     Current Outpatient Prescriptions on File Prior to Visit  Medication Sig Dispense Refill  . FLUoxetine (PROZAC) 20 MG tablet TAKE 1 TABLET (20 MG TOTAL) BY MOUTH DAILY. 30 tablet 3  . ibuprofen (ADVIL,MOTRIN) 200 MG tablet Take 200 mg by mouth every 6 (six) hours as needed. For pain     No current facility-administered medications on file prior to visit.    No Known Allergies  Family History  Problem Relation Age of Onset  . Colon cancer Neg Hx   . Hypertension Mother   . Heart disease Father     History   Social History  . Marital Status: Married    Spouse Name: N/A  . Number of Children: N/A  . Years of Education: N/A   Social History Main Topics  . Smoking status: Former Research scientist (life sciences)  . Smokeless tobacco: Never Used  . Alcohol Use: No  . Drug Use: No  . Sexual Activity: No   Other Topics Concern  . None   Social History Narrative    Review of Systems - See HPI.  All other ROS are negative.  BP 100/58 mmHg  Pulse 68  Temp(Src) 98.3 F (36.8 C) (Oral)  Resp 16  Ht 5' 0.5" (1.537 m)  Wt 145 lb (65.772 kg)  BMI 27.84 kg/m2  SpO2 97%  Physical Exam  Constitutional: She is oriented to person, place, and time and well-developed, well-nourished, and in no distress.  HENT:    Head: Normocephalic and atraumatic.  Eyes: Conjunctivae are normal.  Cardiovascular: Normal rate, regular rhythm, normal heart sounds and intact distal pulses.   Pulmonary/Chest: Effort normal and breath sounds normal. No respiratory distress. She has no wheezes. She has no rales. She exhibits no tenderness.  Abdominal: Soft. Bowel sounds are normal. She exhibits no distension and no mass. There is no hepatosplenomegaly. There is tenderness in the right lower quadrant. There is no rebound, no guarding, no CVA tenderness and no tenderness at McBurney's point. No hernia.  Neurological: She is alert and oriented to person, place, and time.  Skin: Skin is warm and dry. No rash noted.  Psychiatric: Affect normal.  Vitals reviewed.   Recent Results (from the past 2160 hour(s))  POCT Influenza A/B     Status: Normal   Collection Time: 11/20/14  2:08 PM  Result Value Ref Range   Influenza A, POC Negative    Influenza B, POC Negative   CBC with Differential     Status: Abnormal   Collection Time: 11/20/14  2:47 PM  Result Value Ref Range   WBC 16.5 (H) 4.0 - 10.5 K/uL   RBC 4.97 3.87 - 5.11 MIL/uL   Hemoglobin 14.7 12.0 - 15.0 g/dL   HCT 42.1 36.0 - 46.0 %   MCV  84.7 78.0 - 100.0 fL   MCH 29.6 26.0 - 34.0 pg   MCHC 34.9 30.0 - 36.0 g/dL   RDW 13.4 11.5 - 15.5 %   Platelets 211 150 - 400 K/uL   MPV 10.3 8.6 - 12.4 fL   Neutrophils Relative % 89 (H) 43 - 77 %   Neutro Abs 14.7 (H) 1.7 - 7.7 K/uL   Lymphocytes Relative 4 (L) 12 - 46 %   Lymphs Abs 0.7 0.7 - 4.0 K/uL   Monocytes Relative 7 3 - 12 %   Monocytes Absolute 1.2 (H) 0.1 - 1.0 K/uL   Eosinophils Relative 0 0 - 5 %   Eosinophils Absolute 0.0 0.0 - 0.7 K/uL   Basophils Relative 0 0 - 1 %   Basophils Absolute 0.0 0.0 - 0.1 K/uL   Smear Review Criteria for review not met   Comp Met (CMET)     Status: Abnormal   Collection Time: 11/20/14  2:47 PM  Result Value Ref Range   Sodium 133 (L) 135 - 145 mEq/L   Potassium 3.8 3.5 - 5.3  mEq/L   Chloride 97 96 - 112 mEq/L   CO2 23 19 - 32 mEq/L   Glucose, Bld 201 (H) 70 - 99 mg/dL   BUN 21 6 - 23 mg/dL   Creat 1.10 0.50 - 1.10 mg/dL   Total Bilirubin 1.2 0.2 - 1.2 mg/dL   Alkaline Phosphatase 90 39 - 117 U/L   AST 36 0 - 37 U/L   ALT 51 (H) 0 - 35 U/L   Total Protein 7.1 6.0 - 8.3 g/dL   Albumin 4.3 3.5 - 5.2 g/dL   Calcium 9.4 8.4 - 10.5 mg/dL  CBC w/Diff     Status: Abnormal   Collection Time: 11/24/14  1:24 PM  Result Value Ref Range   WBC 12.1 (H) 4.0 - 10.5 K/uL   RBC 4.76 3.87 - 5.11 Mil/uL   Hemoglobin 14.0 12.0 - 15.0 g/dL   HCT 39.7 36.0 - 46.0 %   MCV 83.4 78.0 - 100.0 fl   MCHC 35.2 30.0 - 36.0 g/dL   RDW 12.6 11.5 - 15.5 %   Platelets 280.0 150.0 - 400.0 K/uL   Neutrophils Relative % 77.3 (H) 43.0 - 77.0 %   Lymphocytes Relative 12.3 12.0 - 46.0 %   Monocytes Relative 8.4 3.0 - 12.0 %   Eosinophils Relative 2.0 0.0 - 5.0 %   Basophils Relative 0.0 0.0 - 3.0 %   Neutro Abs 9.4 (H) 1.4 - 7.7 K/uL   Lymphs Abs 1.5 0.7 - 4.0 K/uL   Monocytes Absolute 1.0 0.1 - 1.0 K/uL   Eosinophils Absolute 0.2 0.0 - 0.7 K/uL   Basophils Absolute 0.0 0.0 - 0.1 K/uL  Hemoglobin A1c     Status: Abnormal   Collection Time: 11/25/14  1:25 PM  Result Value Ref Range   Hgb A1c MFr Bld 6.6 (H) 4.6 - 6.5 %    Comment: Glycemic Control Guidelines for People with Diabetes:Non Diabetic:  <6%Goal of Therapy: <7%Additional Action Suggested:  >8%     Assessment/Plan: RLQ abdominal pain Unclear etiology. Urine dip unremarkable. No palpable mass.  Concern for infectious etiology giving intermittent fevers and recent elevation in WBC.  Rx Cipro 500 mg BID.  Will repeat labs today and obtain STAT CT abdomen/pelvis.  Alarm signs/symptoms discussed with patient.  He is to call 911 or go to ER if these occur.  Otherwise follow-up will be based on results.

## 2014-12-02 ENCOUNTER — Ambulatory Visit (HOSPITAL_BASED_OUTPATIENT_CLINIC_OR_DEPARTMENT_OTHER): Payer: BLUE CROSS/BLUE SHIELD

## 2014-12-02 LAB — COMPREHENSIVE METABOLIC PANEL
ALK PHOS: 64 U/L (ref 39–117)
ALT: 23 U/L (ref 0–35)
AST: 22 U/L (ref 0–37)
Albumin: 3.8 g/dL (ref 3.5–5.2)
BUN: 20 mg/dL (ref 6–23)
CO2: 25 mEq/L (ref 19–32)
Calcium: 10.1 mg/dL (ref 8.4–10.5)
Chloride: 104 mEq/L (ref 96–112)
Creatinine, Ser: 0.96 mg/dL (ref 0.40–1.20)
GFR: 62.21 mL/min (ref >60.00)
Glucose, Bld: 102 mg/dL — ABNORMAL HIGH (ref 70–99)
Potassium: 5.3 mEq/L — ABNORMAL HIGH (ref 3.5–5.1)
Sodium: 136 mEq/L (ref 135–145)
Total Bilirubin: 0.4 mg/dL (ref 0.2–1.2)
Total Protein: 7.5 g/dL (ref 6.0–8.3)

## 2014-12-02 LAB — CBC WITH DIFFERENTIAL/PLATELET
Basophils Absolute: 0.4 10*3/uL — ABNORMAL HIGH (ref 0.0–0.1)
Basophils Relative: 4 % — ABNORMAL HIGH (ref 0.0–3.0)
EOS PCT: 2.4 % (ref 0.0–5.0)
Eosinophils Absolute: 0.2 10*3/uL (ref 0.0–0.7)
HEMATOCRIT: 40.1 % (ref 36.0–46.0)
Hemoglobin: 13.6 g/dL (ref 12.0–15.0)
LYMPHS ABS: 1.9 10*3/uL (ref 0.7–4.0)
LYMPHS PCT: 18.7 % (ref 12.0–46.0)
MCHC: 33.8 g/dL (ref 30.0–36.0)
MCV: 85.2 fl (ref 78.0–100.0)
MONOS PCT: 13.5 % — AB (ref 3.0–12.0)
Monocytes Absolute: 1.4 10*3/uL — ABNORMAL HIGH (ref 0.1–1.0)
Neutro Abs: 6.2 10*3/uL (ref 1.4–7.7)
Neutrophils Relative %: 61.4 % (ref 43.0–77.0)
PLATELETS: 351 10*3/uL (ref 150.0–400.0)
RBC: 4.71 Mil/uL (ref 3.87–5.11)
RDW: 13.2 % (ref 11.5–15.5)
WBC: 10 10*3/uL (ref 4.0–10.5)

## 2014-12-03 ENCOUNTER — Ambulatory Visit (HOSPITAL_BASED_OUTPATIENT_CLINIC_OR_DEPARTMENT_OTHER): Payer: BLUE CROSS/BLUE SHIELD

## 2014-12-04 ENCOUNTER — Telehealth: Payer: Self-pay | Admitting: *Deleted

## 2014-12-04 DIAGNOSIS — R1031 Right lower quadrant pain: Secondary | ICD-10-CM

## 2014-12-04 NOTE — Telephone Encounter (Signed)
-----   Message from Brunetta Jeans, PA-C sent at 12/04/2014  9:42 AM EST ----- Ok to order US Abdomen - Complete

## 2014-12-04 NOTE — Progress Notes (Signed)
Ok to place order for US abdomen -- complete

## 2014-12-04 NOTE — Telephone Encounter (Signed)
Imaging department requesting clarification on the abdominal US.  They would like to know if it is of the RLQ?  She states that if it is for the appendix, they do not do that and if it is abdomen it will not reach the RLQ.  She is asking if you would like pelvis?  Please advise.

## 2014-12-04 NOTE — Telephone Encounter (Signed)
Add on a pelvic US as well. It is just chronic abdominal pain but sometimes focused in RLQ.

## 2014-12-04 NOTE — Telephone Encounter (Signed)
Notes Recorded by Brunetta Jeans, PA-C on 12/04/2014 at 9:42 AM Ok to order US Abdomen - Complete Notes Recorded by Rockwell Germany, CMA on 12/04/2014 at 9:12 AM Patient informed, understood & reports that she Canceled Ct scan d/t being unable to drink barium prep because of GERD; states her pain is now minimal and is requesting an [place] as an alternative imaging/SLS Please Advise.   Notes Recorded by Brunetta Jeans, PA-C on 12/02/2014 at 12:59 PM Labs unremarkable -- Potassium slightly elevated. Recommend increased fluid intake. Will recheck in 2 weeks. Waiting on CT results. Will call once in. How is she feeling?  [place] Order placed; Patient informed, understood & agreed/SLS

## 2014-12-04 NOTE — Telephone Encounter (Signed)
Spoke with Imaging, they do not need add-on of pelvic; just needed to clarify that we were not looking at the appendix/SLS

## 2014-12-07 ENCOUNTER — Ambulatory Visit (HOSPITAL_BASED_OUTPATIENT_CLINIC_OR_DEPARTMENT_OTHER)
Admission: RE | Admit: 2014-12-07 | Discharge: 2014-12-07 | Disposition: A | Discharge status: Home / Self Care | Payer: BLUE CROSS/BLUE SHIELD | Source: Ambulatory Visit | Attending: Physician Assistant | Admitting: Physician Assistant

## 2014-12-07 ENCOUNTER — Other Ambulatory Visit: Payer: Self-pay | Admitting: Physician Assistant

## 2014-12-07 DIAGNOSIS — K760 Fatty (change of) liver, not elsewhere classified: Secondary | ICD-10-CM | POA: Diagnosis not present

## 2014-12-07 DIAGNOSIS — R934 Abnormal findings on diagnostic imaging of urinary organs: Secondary | ICD-10-CM | POA: Diagnosis not present

## 2014-12-07 DIAGNOSIS — R109 Unspecified abdominal pain: Secondary | ICD-10-CM

## 2014-12-07 DIAGNOSIS — R1031 Right lower quadrant pain: Secondary | ICD-10-CM | POA: Diagnosis present

## 2014-12-10 ENCOUNTER — Telehealth: Payer: Self-pay | Admitting: Physician Assistant

## 2014-12-10 DIAGNOSIS — N2889 Other specified disorders of kidney and ureter: Secondary | ICD-10-CM

## 2014-12-10 NOTE — Telephone Encounter (Signed)
-----   Message from Rudene Anda, RN sent at 12/10/2014  3:29 PM EST ----- Pt notified and made aware.  She is willing to have CT of Abdomen/Pelvis done.

## 2014-12-10 NOTE — Telephone Encounter (Signed)
Order placed.  She will be called to schedule.

## 2014-12-14 ENCOUNTER — Other Ambulatory Visit (HOSPITAL_BASED_OUTPATIENT_CLINIC_OR_DEPARTMENT_OTHER): Payer: BLUE CROSS/BLUE SHIELD

## 2014-12-15 ENCOUNTER — Encounter (HOSPITAL_BASED_OUTPATIENT_CLINIC_OR_DEPARTMENT_OTHER): Payer: Self-pay

## 2014-12-15 ENCOUNTER — Other Ambulatory Visit: Payer: Self-pay | Admitting: Physician Assistant

## 2014-12-15 ENCOUNTER — Encounter (HOSPITAL_COMMUNITY): Payer: Self-pay | Admitting: Emergency Medicine

## 2014-12-15 ENCOUNTER — Ambulatory Visit (HOSPITAL_BASED_OUTPATIENT_CLINIC_OR_DEPARTMENT_OTHER)
Admission: RE | Admit: 2014-12-15 | Discharge: 2014-12-15 | Disposition: A | Discharge status: Home / Self Care | Payer: BLUE CROSS/BLUE SHIELD | Source: Ambulatory Visit | Attending: Physician Assistant | Admitting: Physician Assistant

## 2014-12-15 ENCOUNTER — Emergency Department (HOSPITAL_COMMUNITY)
Admission: EM | Admit: 2014-12-15 | Discharge: 2014-12-15 | Disposition: A | Discharge status: Home / Self Care | Payer: BLUE CROSS/BLUE SHIELD | Attending: Emergency Medicine | Admitting: Emergency Medicine

## 2014-12-15 ENCOUNTER — Telehealth: Payer: Self-pay | Admitting: Physician Assistant

## 2014-12-15 DIAGNOSIS — K219 Gastro-esophageal reflux disease without esophagitis: Secondary | ICD-10-CM | POA: Insufficient documentation

## 2014-12-15 DIAGNOSIS — N2889 Other specified disorders of kidney and ureter: Secondary | ICD-10-CM

## 2014-12-15 DIAGNOSIS — R1031 Right lower quadrant pain: Secondary | ICD-10-CM

## 2014-12-15 DIAGNOSIS — R509 Fever, unspecified: Secondary | ICD-10-CM | POA: Diagnosis not present

## 2014-12-15 DIAGNOSIS — Z79899 Other long term (current) drug therapy: Secondary | ICD-10-CM | POA: Diagnosis not present

## 2014-12-15 DIAGNOSIS — Z8639 Personal history of other endocrine, nutritional and metabolic disease: Secondary | ICD-10-CM | POA: Diagnosis not present

## 2014-12-15 DIAGNOSIS — Z87891 Personal history of nicotine dependence: Secondary | ICD-10-CM | POA: Diagnosis not present

## 2014-12-15 DIAGNOSIS — K36 Other appendicitis: Secondary | ICD-10-CM

## 2014-12-15 LAB — CBC WITH DIFFERENTIAL/PLATELET
Basophils Absolute: 0 10*3/uL (ref 0.0–0.1)
Basophils Relative: 0 % (ref 0–1)
Eosinophils Absolute: 0.2 10*3/uL (ref 0.0–0.7)
Eosinophils Relative: 3 % (ref 0–5)
HEMATOCRIT: 41.7 % (ref 36.0–46.0)
HEMOGLOBIN: 14.2 g/dL (ref 12.0–15.0)
LYMPHS PCT: 27 % (ref 12–46)
Lymphs Abs: 1.8 10*3/uL (ref 0.7–4.0)
MCH: 29.4 pg (ref 26.0–34.0)
MCHC: 34.1 g/dL (ref 30.0–36.0)
MCV: 86.3 fL (ref 78.0–100.0)
MONO ABS: 0.6 10*3/uL (ref 0.1–1.0)
Monocytes Relative: 9 % (ref 3–12)
NEUTROS ABS: 4.1 10*3/uL (ref 1.7–7.7)
Neutrophils Relative %: 61 % (ref 43–77)
Platelets: 240 10*3/uL (ref 150–400)
RBC: 4.83 MIL/uL (ref 3.87–5.11)
RDW: 13.2 % (ref 11.5–15.5)
WBC: 6.8 10*3/uL (ref 4.0–10.5)

## 2014-12-15 LAB — URINALYSIS, ROUTINE W REFLEX MICROSCOPIC
Bilirubin Urine: NEGATIVE
Glucose, UA: NEGATIVE mg/dL
Hgb urine dipstick: NEGATIVE
KETONES UR: NEGATIVE mg/dL
LEUKOCYTES UA: NEGATIVE
Nitrite: NEGATIVE
PH: 6 (ref 5.0–8.0)
Protein, ur: NEGATIVE mg/dL
Specific Gravity, Urine: 1.026 (ref 1.005–1.030)
Urobilinogen, UA: 0.2 mg/dL (ref 0.0–1.0)

## 2014-12-15 LAB — COMPREHENSIVE METABOLIC PANEL
ALBUMIN: 4.3 g/dL (ref 3.5–5.2)
ALT: 65 U/L — AB (ref 0–35)
AST: 47 U/L — AB (ref 0–37)
Alkaline Phosphatase: 87 U/L (ref 39–117)
Anion gap: 12 (ref 5–15)
BUN: 24 mg/dL — AB (ref 6–23)
CHLORIDE: 101 mmol/L (ref 96–112)
CO2: 26 mmol/L (ref 19–32)
Calcium: 10.2 mg/dL (ref 8.4–10.5)
Creatinine, Ser: 0.9 mg/dL (ref 0.50–1.10)
GFR calc Af Amer: 77 mL/min — ABNORMAL LOW (ref >90)
GFR calc non Af Amer: 67 mL/min — ABNORMAL LOW (ref >90)
GLUCOSE: 118 mg/dL — AB (ref 70–99)
POTASSIUM: 3.8 mmol/L (ref 3.5–5.1)
Sodium: 139 mmol/L (ref 135–145)
Total Bilirubin: 0.5 mg/dL (ref 0.3–1.2)
Total Protein: 7.4 g/dL (ref 6.0–8.3)

## 2014-12-15 LAB — LIPASE, BLOOD: LIPASE: 47 U/L (ref 11–59)

## 2014-12-15 MED ORDER — IOHEXOL 350 MG/ML SOLN
100.0000 mL | Freq: Once | INTRAVENOUS | Status: AC | PRN
  Administered 2014-12-15: 100 mL via INTRAVENOUS

## 2014-12-15 MED ORDER — SODIUM CHLORIDE 0.9 % IV BOLUS (SEPSIS)
1000.0000 mL | Freq: Once | INTRAVENOUS | Status: AC
  Administered 2014-12-15: 1000 mL via INTRAVENOUS

## 2014-12-15 MED ORDER — CIPROFLOXACIN HCL 500 MG PO TABS: 500.0000 mg | ORAL_TABLET | Freq: Two times a day (BID) | ORAL | Status: DC

## 2014-12-15 MED ORDER — AMOXICILLIN-POT CLAVULANATE 875-125 MG PO TABS: ORAL_TABLET | ORAL | Status: DC

## 2014-12-15 NOTE — ED Notes (Signed)
Patient assisted with ambulation to restroom. Requested patient to obtain specimen.

## 2014-12-15 NOTE — Telephone Encounter (Signed)
CT reveals atypical appendicitis.  I want her to repeat a course of the Cipro.  I have also set her up with an urgent referral to general surgery.  The antibiotic can treat mild cases of appendicitis, but it still may need to come out. Rx sent to pharmacy. Please assess for any severe pain, bowel changes, fever, etc. If any severe symptoms or fever present, she is to go to the ER.

## 2014-12-15 NOTE — ED Notes (Signed)
Per pt, states right lower abdominal pain for 3 weeks-has CT done and was told to come here for possible appey

## 2014-12-15 NOTE — Telephone Encounter (Signed)
Per [facility], Marj, [facility] will not take Urgent referral and do not accept appendicitis appointments, called patient back with this information and instructed her to proceed to either [facility] or [facility] [where this is a hospital attached], so that they can do A&E in ED, then admit her to the hospital, as necessary; explained to patient that her Appendix could rupture without warning, which could cause toxicity in her quickly and this is why it is not best to put this off until tomorrow, as she asked if she needed to go to the ED tonight. Patient still did not seem comfortable with these instructions, so I asked her if she would feel better speaking to the provider and she stated yes, that she would like that and I transferred the telephone to [place]/SLS

## 2014-12-15 NOTE — ED Provider Notes (Signed)
CSN: [id number]     Arrival date & time 12/15/14  44 History   First MD Initiated Contact with Patient 12/15/14 1752     Chief Complaint  Patient presents with  . Abdominal Pain     (Consider location/radiation/quality/duration/timing/severity/associated sxs/prior Treatment) The history is provided by the patient. No language interpreter was used.  [name] [name] is a 64 y/o F with PMHx of GERD, achalasia, dysphagia, anxiety, diverticulosis presenting to the ED with RLQ abdominal pain that has been ongoing for the past 3 weeks. Patient reported that the pain is a constant full, aching pain that does not radiate. Reported that the pain has stayed the same since the beginning - no change or exacerbation. Stated that the pain does get worse with motion. Reported that she was taking Ciprofloxacin in February - finished course about one week ago. Reported that she was seen and assessed by her primary care provider today, CT abdomen and pelvis with contrast performed and patient received a phone call to come to the ED to be assessed. Patient reported that she's been eating normally. Reported normal bowel movements and passing gas without complications. Denied fever, chest pain, shortness of breath, difficulty breathing, nausea, vomiting, diarrhea, melena, hematochezia, changes to appetite, weight loss, dysuria, hematuria, back pain. PCP Dr. Birdie Riddle  Past Medical History  Diagnosis Date  . GERD (gastroesophageal reflux disease)   . Hyperlipidemia   . Diverticulosis   . Achalasia   . Dysphagia   . Anxiety and depression    Past Surgical History  Procedure Laterality Date  . Laparoscopic myotomy     Family History  Problem Relation Age of Onset  . Colon cancer Neg Hx   . Hypertension Mother   . Heart disease Father    History  Substance Use Topics  . Smoking status: Former Research scientist (life sciences)  . Smokeless tobacco: Never Used  . Alcohol Use: No   OB History    Gravida Para Term Preterm AB TAB SAB  Ectopic Multiple Living   2 2        2      Review of Systems  Constitutional: Positive for fever (Intermittent). Negative for chills.  Respiratory: Negative for chest tightness and shortness of breath.   Cardiovascular: Negative for chest pain.  Gastrointestinal: Positive for abdominal pain. Negative for nausea, vomiting, diarrhea, constipation, blood in stool and anal bleeding.  Genitourinary: Negative for dysuria and hematuria.  Musculoskeletal: Negative for back pain, neck pain and neck stiffness.      Allergies  Review of patient's allergies indicates no known allergies.  Home Medications   Prior to Admission medications   Medication Sig Start Date End Date Taking? Authorizing Provider  acetaminophen (TYLENOL) 500 MG tablet Take 500-1,000 mg by mouth every 6 (six) hours as needed for moderate pain or headache.   Yes Historical Provider, MD  FLUoxetine (PROZAC) 20 MG tablet TAKE 1 TABLET (20 MG TOTAL) BY MOUTH DAILY. 11/18/14  Yes Midge Minium, MD  ibuprofen (ADVIL,MOTRIN) 200 MG tablet Take 200 mg by mouth every 6 (six) hours as needed for headache or moderate pain. For pain   Yes Historical Provider, MD  ranitidine (ZANTAC) 75 MG tablet Take 75 mg by mouth as needed for heartburn.   Yes Historical Provider, MD  amoxicillin-clavulanate (AUGMENTIN) 875-125 MG per tablet One po bid x 14 days 12/15/14   Harley Fitzwater, PA-C  ciprofloxacin (CIPRO) 500 MG tablet Take 1 tablet (500 mg total) by mouth 2 (two) times daily. Patient not taking:  Reported on 12/15/2014 12/15/14   Brunetta Jeans, PA-C   BP 121/62 mmHg  Pulse 80  Temp(Src) 97.9 F (36.6 C) (Oral)  Resp 16  SpO2 96% Physical Exam  Constitutional: She is oriented to person, place, and time. She appears well-developed and well-nourished. No distress.  HENT:  Head: Normocephalic and atraumatic.  Eyes: Conjunctivae and EOM are normal. Right eye exhibits no discharge. Left eye exhibits no discharge.  Neck: Normal range of  motion. Neck supple.  Cardiovascular: Normal rate, regular rhythm and normal heart sounds.   Pulses:      Radial pulses are 2+ on the right side, and 2+ on the left side.  Pulmonary/Chest: Effort normal and breath sounds normal. No respiratory distress. She has no wheezes. She has no rales.  Abdominal: Soft. Bowel sounds are normal. She exhibits no distension. There is tenderness in the right lower quadrant. There is tenderness at McBurney's point. There is no rebound and no guarding.  Negative Rovsing's sign  Musculoskeletal: Normal range of motion.  Neurological: She is alert and oriented to person, place, and time.  Skin: Skin is warm and dry. No rash noted. She is not diaphoretic. No erythema.  Psychiatric: She has a normal mood and affect. Her behavior is normal. Thought content normal.  Nursing note and vitals reviewed.   ED Course  Procedures (including critical care time)  Results for orders placed or performed during the hospital encounter of 12/15/14  CBC with Differential/Platelet  Result Value Ref Range   WBC 6.8 4.0 - 10.5 K/uL   RBC 4.83 3.87 - 5.11 MIL/uL   Hemoglobin 14.2 12.0 - 15.0 g/dL   HCT 41.7 36.0 - 46.0 %   MCV 86.3 78.0 - 100.0 fL   MCH 29.4 26.0 - 34.0 pg   MCHC 34.1 30.0 - 36.0 g/dL   RDW 13.2 11.5 - 15.5 %   Platelets 240 150 - 400 K/uL   Neutrophils Relative % 61 43 - 77 %   Neutro Abs 4.1 1.7 - 7.7 K/uL   Lymphocytes Relative 27 12 - 46 %   Lymphs Abs 1.8 0.7 - 4.0 K/uL   Monocytes Relative 9 3 - 12 %   Monocytes Absolute 0.6 0.1 - 1.0 K/uL   Eosinophils Relative 3 0 - 5 %   Eosinophils Absolute 0.2 0.0 - 0.7 K/uL   Basophils Relative 0 0 - 1 %   Basophils Absolute 0.0 0.0 - 0.1 K/uL  Comprehensive metabolic panel  Result Value Ref Range   Sodium 139 135 - 145 mmol/L   Potassium 3.8 3.5 - 5.1 mmol/L   Chloride 101 96 - 112 mmol/L   CO2 26 19 - 32 mmol/L   Glucose, Bld 118 (H) 70 - 99 mg/dL   BUN 24 (H) 6 - 23 mg/dL   Creatinine, Ser 0.90  0.50 - 1.10 mg/dL   Calcium 10.2 8.4 - 10.5 mg/dL   Total Protein 7.4 6.0 - 8.3 g/dL   Albumin 4.3 3.5 - 5.2 g/dL   AST 47 (H) 0 - 37 U/L   ALT 65 (H) 0 - 35 U/L   Alkaline Phosphatase 87 39 - 117 U/L   Total Bilirubin 0.5 0.3 - 1.2 mg/dL   GFR calc non Af Amer 67 (L) >90 mL/min   GFR calc Af Amer 77 (L) >90 mL/min   Anion gap 12 5 - 15  Lipase, blood  Result Value Ref Range   Lipase 47 11 - 59 U/L  Urinalysis, Routine  w reflex microscopic  Result Value Ref Range   Color, Urine YELLOW YELLOW   APPearance CLEAR CLEAR   Specific Gravity, Urine 1.026 1.005 - 1.030   pH 6.0 5.0 - 8.0   Glucose, UA NEGATIVE NEGATIVE mg/dL   Hgb urine dipstick NEGATIVE NEGATIVE   Bilirubin Urine NEGATIVE NEGATIVE   Ketones, ur NEGATIVE NEGATIVE mg/dL   Protein, ur NEGATIVE NEGATIVE mg/dL   Urobilinogen, UA 0.2 0.0 - 1.0 mg/dL   Nitrite NEGATIVE NEGATIVE   Leukocytes, UA NEGATIVE NEGATIVE    Labs Review Labs Reviewed  COMPREHENSIVE METABOLIC PANEL - Abnormal; Notable for the following:    Glucose, Bld 118 (*)    BUN 24 (*)    AST 47 (*)    ALT 65 (*)    GFR calc non Af Amer 67 (*)    GFR calc Af Amer 77 (*)    All other components within normal limits  CBC WITH DIFFERENTIAL/PLATELET  LIPASE, BLOOD  URINALYSIS, ROUTINE W REFLEX MICROSCOPIC    Imaging Review Ct Abdomen Pelvis W Contrast  12/15/2014   CLINICAL DATA:  Renal mass on prior ultrasound. RIGHT lower quadrant pain.  EXAM: CT ABDOMEN AND PELVIS WITH CONTRAST  TECHNIQUE: Multidetector CT imaging of the abdomen and pelvis was performed using the standard protocol following bolus administration of intravenous contrast.  CONTRAST:  124mL OMNIPAQUE IOHEXOL 350 MG/ML SOLN  COMPARISON:  Ultrasound 12/07/2014.  FINDINGS: Musculoskeletal:  Normal.  Benign hemangioma present at T12.  Lung Bases: Atelectasis.  The esophagus is filled with fluid and dilated throughout the visualized portions. This extends up to the gastroesophageal junction. No  discrete mass lesion is identified, suggesting this may represent achalasia. Comparing to CT 03/07/2007, the esophagus was mildly dilated at that time.  Liver: Hepatomegaly and hepatosteatosis is present. Although there is a Riedel's lobe of the liver, the liver is enlarged, with 23 cm liver span. No mass lesion or intrahepatic biliary ductal dilation.  Spleen: Splenomegaly is present. No mass lesions. Splenic span is 13.4 cm.  Gallbladder:  Normal.  No calcified stones.  Common bile duct:  Normal.  Pancreas:  Normal.  Adrenal glands:  Normal bilaterally.  Kidneys: There is a tiny low-density lesion in the LEFT upper renal pole. This is too small to characterize and measures about 4 mm. Differential considerations are renal cyst versus a benign angiomyolipoma. There are 2 similar appearing low-density lesions in the interpolar LEFT kidney. No solid renal neoplasms are identified. When correlating with the prior ultrasound, it is unclear which of these represented the focus in question. Normal urinary excretion of contrast bilaterally. Bilateral ureteral jets are visible. Nearly the entire course of the ureters is visible bilaterally.  Stomach:  Normal, distended with negative oral contrast.  Small bowel:  Normal.  Colon: The appendix is identified in the RIGHT lower quadrant. Periappendiceal inflammatory changes are present with phlegmon in the periappendiceal fat. The appendix does contain gas in the midportion. There is no abscess or intra-abdominal free air adjacent to the appendix. There is a moderate stool burden in the colon but no other inflammatory changes of the colon.  Pelvic Genitourinary: Urinary bladder appears normal with bilateral ureteral jets. Uterus and adnexae appear within normal limits for age.  Peritoneum: No free air.  No free fluid.  Vasculature: Findings consistent with portal venous hypertension. The portal vein measures 18 mm AP. Mild atherosclerosis. No aneurysm.  Body Wall: Negative for  hernia. Varices are present in the LEFT inguinal region.  IMPRESSION: 1.  Achalasia.  Patient has this as a known diagnosis. 2. Hepatosteatosis, hepatomegaly, splenomegaly consistent with portal venous hypertension 3. Periappendiceal phlegmon in the RIGHT lower quadrant without other findings of acute appendicitis. Notably, the appendix contains gas rather than a dilated appendix filled with fluid typically seen in acute appendicitis. Given history on the prior ultrasound, this is favored to represent incompletely treated acute appendicitis or chronic appendicitis. On discussion with Mr. Jimmye Norman, the patient was given a course of Cipro in late February and this probably represents partially treated appendicitis. 4. Tiny too small to characterize bilateral low-density renal lesions, probably representing cysts or angiomyolipoma. No worrisome renal lesions or solid enhancing renal mass. These results were called by telephone at the time of interpretation on 12/15/2014 at 2:50 pm to Dr. Earlie Server PA, [place], [place], who verbally acknowledged these results.   Electronically Signed   By: Dereck Ligas M.D.   On: 12/15/2014 15:14     EKG Interpretation None      7:27 PM Patient refusing pain medications, reported that she does not one them.  7:36 PM This provider spoke with Dr. Clent Demark, general surgeon. This provider discussed case, labs, imaging, ED course and physical examination in great detail. Physician reviewed imaging and labs. General surgeon recommended that since patient does not have a fever, no elevated white blood cell count, has been having symptoms for approximately 3 weeks with no findings of perforation or abscess on CT - reported that patient can be followed as an outpatient. Stated the patient can be discharged with Augmentin 875 twice a day for 2 weeks, 28 tablets. Reported that office will call the patient tomorrow and schedule an appointment.  MDM   Final diagnoses:  Right lower  quadrant abdominal pain    Medications  sodium chloride 0.9 % bolus 1,000 mL (0 mLs Intravenous Stopped 12/15/14 2047)    Filed Vitals:   12/15/14 2030 12/15/14 2045 12/15/14 2056 12/15/14 2204  BP: 120/67 125/71 124/54 121/62  Pulse: 76 79 73 80  Temp:    97.9 F (36.6 C)  TempSrc:    Oral  Resp: 14 20 18 16   SpO2: 94% 94% 96% 96%   CBC negative elevated leukocytosis-white blood cell count of 6.8. Hemoglobin 14.2, hematocrit 41.7. CMP noted mildly elevated AST of 47, ALT is 65. Negative elevated bilirubin or alkaline phosphatase. Lipase negative elevation. Urinalysis unremarkable-negative hemoglobin, nitrites, leukocytes. CT abdomen and pelvis with contrast, performed as an outpatient on 12/15/2014, identified periappendiceal phlegmon of the right lower quadrant, appendix has gas-appears to be an incompletely treated acute appendicitis versus chronic appendicitis that cannot rule out possible partially treated appendicitis after ciprofloxacin. This provider had a long discussion with on-call physician for general surgery, Dr. Jannette Fogo per physician who reviewed images and labs recommended patient be discharged home. Negative elevated leukocytosis, no perforation or abscess noted on CT, no fever, vitals stable, patient tolerated food and fluids without difficulty, ongoing for about 3 weeks-recommended patient be discharged with Augmentin twice a day and for patient to follow-up in the office as an outpatient-reported that office will call patient tomorrow. Patient stable, afebrile. Patient not septic appearing. Patient tolerating by mouth, negative changes to appetite. Labs unremarkable. Discharged patient. Discharged patient with Augmentin. Discussed with patient to rest and stay hydrated. Referred patient to [facility] surgery. Discussed with patient to closely monitor symptoms and if symptoms are to worsen or change to report back to the ED - strict return instructions given.  Patient  agreed  to plan of care, understood, all questions answered.   Jamse Mead, PA-C 12/15/14 2210  Debby Freiberg, MD 12/18/14 [phone]

## 2014-12-15 NOTE — Discharge Instructions (Signed)
Please call your doctor for a followup appointment within 24-48 hours. When you talk to your doctor please let them know that you were seen in the emergency department and have them acquire all of your records so that they can discuss the findings with you and formulate a treatment plan to fully care for your new and ongoing problems. Please follow-up with your primary care provider Please follow up with [facility] office will be calling you tomorrow to set up an appointment Please take antibiotics as prescribed  Please rest and stay hydrated-please drink plenty of water Please continue to monitor symptoms closely and if symptoms are to worsen or change (fever greater than 101, chills, sweating, nausea, vomiting, chest pain, shortness of breathe, difficulty breathing, weakness, numbness, tingling, worsening or changes to pain pattern, blood in the stools, black tarry stools, inability to keep food or fluids down) please report back to the Emergency Department immediately.    Abdominal Pain Many things can cause abdominal pain. Usually, abdominal pain is not caused by a disease and will improve without treatment. It can often be observed and treated at home. Your health care provider will do a physical exam and possibly order blood tests and X-rays to help determine the seriousness of your pain. However, in many cases, more time must pass before a clear cause of the pain can be found. Before that point, your health care provider may not know if you need more testing or further treatment. HOME CARE INSTRUCTIONS  Monitor your abdominal pain for any changes. The following actions may help to alleviate any discomfort you are experiencing:  Only take over-the-counter or prescription medicines as directed by your health care provider.  Do not take laxatives unless directed to do so by your health care provider.  Try a clear liquid diet (broth, tea, or water) as directed by your health  care provider. Slowly move to a bland diet as tolerated. SEEK MEDICAL CARE IF:  You have unexplained abdominal pain.  You have abdominal pain associated with nausea or diarrhea.  You have pain when you urinate or have a bowel movement.  You experience abdominal pain that wakes you in the night.  You have abdominal pain that is worsened or improved by eating food.  You have abdominal pain that is worsened with eating fatty foods.  You have a fever. SEEK IMMEDIATE MEDICAL CARE IF:   Your pain does not go away within 2 hours.  You keep throwing up (vomiting).  Your pain is felt only in portions of the abdomen, such as the right side or the left lower portion of the abdomen.  You pass bloody or black tarry stools. MAKE SURE YOU:  Understand these instructions.   Will watch your condition.   Will get help right away if you are not doing well or get worse.  Document Released: 07/12/2005 Document Revised: 10/07/2013 Document Reviewed: 06/11/2013 [facility] Patient Information 2015 [place], [place]. This information is not intended to replace advice given to you by your health care provider. Make sure you discuss any questions you have with your health care provider.

## 2014-12-15 NOTE — Telephone Encounter (Signed)
Discussed issue with patient.  Encouraged her is surgery is not willing to see her in office and recommended ER, she needs to go as even though she feels better, this could take a turn for the worse.  Patient agrees to go to ER.

## 2014-12-15 NOTE — Telephone Encounter (Signed)
Patient informed, understood & agreed; she will call if she has any further questions and/or concerns/SLS

## 2014-12-17 ENCOUNTER — Other Ambulatory Visit (INDEPENDENT_AMBULATORY_CARE_PROVIDER_SITE_OTHER): Payer: Self-pay | Admitting: Surgery

## 2014-12-17 DIAGNOSIS — R1031 Right lower quadrant pain: Secondary | ICD-10-CM

## 2014-12-17 NOTE — Addendum Note (Signed)
Addended by: Earnstine Regal on: 12/17/2014 04:45 PM   Modules accepted: Orders

## 2015-01-13 ENCOUNTER — Encounter: Payer: Self-pay | Admitting: Family Medicine

## 2015-01-13 ENCOUNTER — Ambulatory Visit (INDEPENDENT_AMBULATORY_CARE_PROVIDER_SITE_OTHER): Payer: BLUE CROSS/BLUE SHIELD | Admitting: Family Medicine

## 2015-01-13 VITALS — BP 122/82 | HR 111 | Temp 98.0°F | Resp 16 | Wt 147.5 lb

## 2015-01-13 DIAGNOSIS — E785 Hyperlipidemia, unspecified: Secondary | ICD-10-CM

## 2015-01-13 DIAGNOSIS — K36 Other appendicitis: Secondary | ICD-10-CM | POA: Insufficient documentation

## 2015-01-13 DIAGNOSIS — R162 Hepatomegaly with splenomegaly, not elsewhere classified: Secondary | ICD-10-CM

## 2015-01-13 DIAGNOSIS — K1379 Other lesions of oral mucosa: Secondary | ICD-10-CM | POA: Diagnosis not present

## 2015-01-13 DIAGNOSIS — K130 Diseases of lips: Secondary | ICD-10-CM | POA: Insufficient documentation

## 2015-01-13 LAB — BASIC METABOLIC PANEL
BUN: 17 mg/dL (ref 6–23)
CALCIUM: 10.3 mg/dL (ref 8.4–10.5)
CHLORIDE: 101 meq/L (ref 96–112)
CO2: 31 meq/L (ref 19–32)
CREATININE: 0.91 mg/dL (ref 0.40–1.20)
GFR: 66.15 mL/min (ref >60.00)
Glucose, Bld: 125 mg/dL — ABNORMAL HIGH (ref 70–99)
POTASSIUM: 4 meq/L (ref 3.5–5.1)
Sodium: 136 mEq/L (ref 135–145)

## 2015-01-13 LAB — CBC WITH DIFFERENTIAL/PLATELET
Basophils Absolute: 0 10*3/uL (ref 0.0–0.1)
Basophils Relative: 0.1 % (ref 0.0–3.0)
EOS ABS: 0.2 10*3/uL (ref 0.0–0.7)
EOS PCT: 2.6 % (ref 0.0–5.0)
HCT: 44.1 % (ref 36.0–46.0)
Hemoglobin: 15.4 g/dL — ABNORMAL HIGH (ref 12.0–15.0)
LYMPHS PCT: 24.5 % (ref 12.0–46.0)
Lymphs Abs: 1.9 10*3/uL (ref 0.7–4.0)
MCHC: 34.9 g/dL (ref 30.0–36.0)
MCV: 84.1 fl (ref 78.0–100.0)
Monocytes Absolute: 0.6 10*3/uL (ref 0.1–1.0)
Monocytes Relative: 7.6 % (ref 3.0–12.0)
NEUTROS ABS: 5.1 10*3/uL (ref 1.4–7.7)
NEUTROS PCT: 65.2 % (ref 43.0–77.0)
Platelets: 239 10*3/uL (ref 150.0–400.0)
RBC: 5.24 Mil/uL — ABNORMAL HIGH (ref 3.87–5.11)
RDW: 13.8 % (ref 11.5–15.5)
WBC: 7.9 10*3/uL (ref 4.0–10.5)

## 2015-01-13 LAB — LIPID PANEL
CHOL/HDL RATIO: 5
Cholesterol: 175 mg/dL (ref 0–200)
HDL: 36.5 mg/dL — ABNORMAL LOW (ref >39.00)
Triglycerides: 651 mg/dL — ABNORMAL HIGH (ref 0.0–149.0)

## 2015-01-13 LAB — HEPATIC FUNCTION PANEL
ALT: 57 U/L — ABNORMAL HIGH (ref 0–35)
AST: 43 U/L — ABNORMAL HIGH (ref 0–37)
Albumin: 4.3 g/dL (ref 3.5–5.2)
Alkaline Phosphatase: 93 U/L (ref 39–117)
BILIRUBIN DIRECT: 0.1 mg/dL (ref 0.0–0.3)
Total Bilirubin: 0.7 mg/dL (ref 0.2–1.2)
Total Protein: 7.6 g/dL (ref 6.0–8.3)

## 2015-01-13 LAB — LDL CHOLESTEROL, DIRECT: Direct LDL: 63 mg/dL

## 2015-01-13 NOTE — Assessment & Plan Note (Signed)
New based on CT scan.  Imaging reports this is consistent w/ portal hypertension.  Attempted to refer pt to GI last year due to elevated LFTs but pt did not go.  Again stressed importance of this- pt states she is now willing.  Referral placed.

## 2015-01-13 NOTE — Assessment & Plan Note (Signed)
New.  Provided reassurance to pt but she prefers to see Dermatology for possible removal.  Will follow.

## 2015-01-13 NOTE — Assessment & Plan Note (Signed)
Pt w/ hx of this.  Has not been on meds due to abnormal LFTs.  Repeat today and determine next steps based on results.

## 2015-01-13 NOTE — Assessment & Plan Note (Signed)
New to provider, ongoing for pt.  Recent imaging showed partially treated appendicitis which was consistent w/ pt's abdominal pain.  Surgery recommended 4 weeks of augmentin (pt did not take) and repeat CT scan (which pt doesn't want to do).  Was asking for my blessing to cancel both f/u imaging and appt.  Told her I could not do this and she really needed to speak w/ Dr Harlow Asa regarding this issue.  Pt is currently pain free.  Will forward note to Dr Harlow Asa for review.

## 2015-01-13 NOTE — Patient Instructions (Signed)
Follow up in 3 months to recheck your sugar Call and schedule an appt w/ derm for the mucocele on your lower lip Call and ask Dr Harlow Asa his opinion on your repeat CT scan We will call you with your GI appt We'll notify you of your lab results and make any changes if needed Call with any questions or concerns Hang in there! Happy [birth year] [employer]!

## 2015-01-13 NOTE — Progress Notes (Signed)
   Subjective:    Patient ID: [name] [name], female    DOB: 1950-10-19, 64 y.o.   MRN: [id number]  HPI Hyperlipidemia- noted last year.  Not on statin due to elevated liver fxns.  Due for repeat labs.  Hepatosplenomegaly- noted on CT scan done 3/1.  This is consistent w/ portal hypertension.  Also had elevated liver enzymes at the time.  Currently taking milk thistle.  Pt's LFTs 1 year ago were as high as 150.  At that time it was recommended to see GI but pt did not go.    abd pain- pt was noted on CT to have partially treated appendicitis.  Dr Harlow Asa told pt to take Augmentin x4 weeks- 'i didn't do it'.  Pt is supposed to f/u w/ surgeon after repeat CT scan on April 7th.  Pt reports bowel movements have improved w/ prune juice.  Pt doesn't want to repeat CT scan nor follow up w/ surgery.   Review of Systems For ROS see HPI     Objective:   Physical Exam  Constitutional: She is oriented to person, place, and time. She appears well-developed and well-nourished. No distress.  HENT:  Head: Normocephalic and atraumatic.  Mucocele of L lower lip  Eyes: Conjunctivae and EOM are normal. Pupils are equal, round, and reactive to light.  Neck: Normal range of motion. Neck supple.  Cardiovascular: Normal rate, regular rhythm, normal heart sounds and intact distal pulses.   No murmur heard. Pulmonary/Chest: Effort normal and breath sounds normal. No respiratory distress.  Abdominal: Soft. She exhibits no distension. There is no tenderness.  + hepatomegaly  Musculoskeletal: She exhibits no edema.  Lymphadenopathy:    She has no cervical adenopathy.  Neurological: She is alert and oriented to person, place, and time.  Skin: Skin is warm and dry.  Psychiatric: She has a normal mood and affect. Her behavior is normal.  Vitals reviewed.         Assessment & Plan:

## 2015-01-13 NOTE — Progress Notes (Signed)
Pre visit review using our clinic review tool, if applicable. No additional management support is needed unless otherwise documented below in the visit note. 

## 2015-01-14 ENCOUNTER — Other Ambulatory Visit: Payer: Self-pay | Admitting: Family Medicine

## 2015-01-14 ENCOUNTER — Encounter: Payer: Self-pay | Admitting: Gastroenterology

## 2015-01-14 DIAGNOSIS — E785 Hyperlipidemia, unspecified: Secondary | ICD-10-CM

## 2015-01-15 ENCOUNTER — Other Ambulatory Visit: Payer: Self-pay | Admitting: Surgery

## 2015-01-15 DIAGNOSIS — R1031 Right lower quadrant pain: Secondary | ICD-10-CM

## 2015-01-21 ENCOUNTER — Other Ambulatory Visit: Payer: BLUE CROSS/BLUE SHIELD

## 2015-02-18 ENCOUNTER — Other Ambulatory Visit (HOSPITAL_COMMUNITY): Payer: Self-pay | Admitting: Dermatology

## 2015-03-11 ENCOUNTER — Encounter: Payer: Self-pay | Admitting: Gastroenterology

## 2015-03-11 ENCOUNTER — Ambulatory Visit (INDEPENDENT_AMBULATORY_CARE_PROVIDER_SITE_OTHER): Payer: BLUE CROSS/BLUE SHIELD | Admitting: Gastroenterology

## 2015-03-11 ENCOUNTER — Other Ambulatory Visit (INDEPENDENT_AMBULATORY_CARE_PROVIDER_SITE_OTHER): Payer: BLUE CROSS/BLUE SHIELD

## 2015-03-11 VITALS — BP 116/62 | HR 96 | Ht 60.25 in | Wt 145.0 lb

## 2015-03-11 DIAGNOSIS — K220 Achalasia of cardia: Secondary | ICD-10-CM

## 2015-03-11 DIAGNOSIS — Z1211 Encounter for screening for malignant neoplasm of colon: Secondary | ICD-10-CM

## 2015-03-11 DIAGNOSIS — R162 Hepatomegaly with splenomegaly, not elsewhere classified: Secondary | ICD-10-CM

## 2015-03-11 LAB — IBC PANEL
Iron: 79 ug/dL (ref 42–145)
SATURATION RATIOS: 20.7 % (ref 20.0–50.0)
Transferrin: 272 mg/dL (ref 212.0–360.0)

## 2015-03-11 LAB — PROTIME-INR
INR: 1 ratio (ref 0.8–1.0)
Prothrombin Time: 10.8 s (ref 9.6–13.1)

## 2015-03-11 LAB — FERRITIN: FERRITIN: 171.4 ng/mL (ref 10.0–291.0)

## 2015-03-11 NOTE — Assessment & Plan Note (Addendum)
I am suspicious that hepatosplenomegaly reflects cirrhosis and portal hypertension as result of [place].  Other etiologies for chronic liver disease including hepatitis, PBC, autoimmune hepatitis and hemochromatosis should be ruled out.  Recommendations #1 check serologies for hepatitis B and C, antinuclear antibody, anti-mitochondrial antibody, iron, TIBC and ferritin levels #2 elastography  CC Dr. Birdie Riddle

## 2015-03-11 NOTE — Assessment & Plan Note (Signed)
Patient stool for screening colonoscopy

## 2015-03-11 NOTE — Progress Notes (Signed)
_                                                                                                                History of Present Illness:  [name] [name] is a 64 year old white female with history of achalasia, status post myotomy, referred at the request of Dr. Birdie Riddle evaluation of hepatosplenomegaly.  This was noted by CT scan, which I reviewed, that was taken in March when she was evaluated for right lower quadrant pain.  Hepatic steatosis was noted.  Patient has no history of liver disease or alcohol abuse.  A hemoglobin A-1 C several months ago was 6.6. Last endoscopy in 2012 did not identify any varices.   Past Medical History  Diagnosis Date  . GERD (gastroesophageal reflux disease)   . Hyperlipidemia   . Diverticulosis   . Achalasia   . Dysphagia   . Anxiety and depression   . Status post dilation of esophageal narrowing    Past Surgical History  Procedure Laterality Date  . Laparoscopic myotomy     family history includes Heart disease in her father; Hypertension in her mother. There is no history of Colon cancer, Colon polyps, Diabetes, or Kidney disease. Current Outpatient Prescriptions  Medication Sig Dispense Refill  . acetaZOLAMIDE (DIAMOX) 125 MG tablet Take 125 mg by mouth as needed. For travel only  0  . FLUoxetine (PROZAC) 20 MG tablet TAKE 1 TABLET (20 MG TOTAL) BY MOUTH DAILY. 30 tablet 3  . Milk Thistle-Dand-Fennel-Licor (MILK THISTLE XTRA PO) Take 1 drop by mouth daily. For liver    . ranitidine (ZANTAC) 75 MG tablet Take 75 mg by mouth as needed for heartburn.     No current facility-administered medications for this visit.   Allergies as of 03/11/2015  . (No Known Allergies)    reports that she has quit smoking. She has never used smokeless tobacco. She reports that she does not drink alcohol or use illicit drugs.   Review of Systems: Pertinent positive and negative review of systems were noted in the above HPI section. All other review of  systems were otherwise negative.  Vital signs were reviewed in today's medical record Physical Exam: General: Well developed , well nourished, no acute distress Skin: anicteric Head: Normocephalic and atraumatic Eyes:  sclerae anicteric, EOMI Ears: Normal auditory acuity Mouth: No deformity or lesions Neck: Supple, no masses or thyromegaly Lymph Nodes: no lymphadenopathy Lungs: Clear throughout to auscultation Heart: Regular rate and rhythm; no murmurs, rubs or bruits Gastroinestinal: Soft, non tender and non distended. No masses, hepatosplenomegaly or hernias noted. Normal Bowel sounds.  There is a prominent venous pattern  Rectal:deferred Musculoskeletal: Symmetrical with no gross deformities  Skin: No lesions on visible extremities Pulses:  Normal pulses noted Extremities: No clubbing, cyanosis, edema or deformities noted Neurological: Alert oriented x 4, grossly nonfocal Cervical Nodes:  No significant cervical adenopathy Inguinal Nodes: No significant inguinal adenopathy Psychological:  Alert and cooperative. Normal mood and affect  See Assessment and Plan under Problem List

## 2015-03-11 NOTE — Patient Instructions (Addendum)
You have been scheduled for an abdominal ultrasound at [facility] (1st floor of hospital) on 04/06/2015 at 9am. Please arrive 15 minutes prior to your appointment for registration. Make certain not to have anything to eat or drink 8 hours prior to your appointment. Should you need to reschedule your appointment, please contact radiology at [phone]. This test typically takes about 30 minutes to perform.  GO to the basement for labs today Your follow up appointment with Dr Deatra Ina is on 05/25/2015 at 8:45am

## 2015-03-11 NOTE — Assessment & Plan Note (Signed)
Inches well-controlled following myotomy

## 2015-03-12 LAB — HEPATITIS B SURFACE ANTIGEN: Hepatitis B Surface Ag: NEGATIVE

## 2015-03-12 LAB — MITOCHONDRIAL ANTIBODIES: MITOCHONDRIAL M2 AB, IGG: 0.57 (ref <0.91)

## 2015-03-12 LAB — HEPATITIS B SURFACE ANTIBODY,QUALITATIVE: Hep B S Ab: POSITIVE — AB

## 2015-03-12 LAB — HEPATITIS C ANTIBODY: HCV Ab: NEGATIVE

## 2015-03-12 LAB — ANA: Anti Nuclear Antibody(ANA): NEGATIVE

## 2015-03-29 ENCOUNTER — Encounter: Payer: Self-pay | Admitting: *Deleted

## 2015-03-29 ENCOUNTER — Other Ambulatory Visit: Payer: Self-pay | Admitting: Otolaryngology

## 2015-04-06 ENCOUNTER — Ambulatory Visit (HOSPITAL_COMMUNITY): Payer: BLUE CROSS/BLUE SHIELD

## 2015-04-14 ENCOUNTER — Telehealth: Payer: Self-pay | Admitting: Family Medicine

## 2015-04-14 MED ORDER — FLUOXETINE HCL 40 MG PO CAPS: 40.0000 mg | ORAL_CAPSULE | Freq: Every day | ORAL | Status: DC

## 2015-04-14 NOTE — Telephone Encounter (Signed)
Please advise, pt last seen 01/13/15.  prozac was filled 11/18/14 #30 with 3

## 2015-04-14 NOTE — Telephone Encounter (Signed)
Left a detailed message for pt to inform. Med filled to local pharmacy as requested.

## 2015-04-14 NOTE — Telephone Encounter (Signed)
Ok to increase to 40mg  daily, #30, 3 refills.  Needs OV to reassess mood in 1 month

## 2015-04-14 NOTE — Telephone Encounter (Signed)
Caller name: Vannah Nadal Relationship to patient: self Can be reached: [phone] Pharmacy: [facility] on [address]  Reason for call: Pt called for refill on FLUoxetine (PROZAC) 20 MG tablet. She has only 5 pills left. She takes 1/day. She is asking if possible for her to get an increased dose.

## 2015-04-27 ENCOUNTER — Telehealth: Payer: Self-pay | Admitting: Family Medicine

## 2015-04-27 NOTE — Telephone Encounter (Signed)
Caller name: bethene Relation to pt: Call back number: [phone] Pharmacy:  Reason for call:   Patient left message on VM stating that she would like a callback regarding her change in medications.

## 2015-04-27 NOTE — Telephone Encounter (Signed)
Called patient at [phone] [facility]) *Preferred* and left message for return call.

## 2015-04-27 NOTE — Telephone Encounter (Signed)
Patient returned phone call. °

## 2015-04-27 NOTE — Telephone Encounter (Signed)
FYI- Patient states she would like to discuss changing her medications.  She does not want to take the Prozac due to concerns for her liver.  She requested an appointment to discuss.    Appointment scheduled 04/28/15 at 2:30.

## 2015-04-28 ENCOUNTER — Encounter: Payer: Self-pay | Admitting: Family Medicine

## 2015-04-28 ENCOUNTER — Ambulatory Visit (INDEPENDENT_AMBULATORY_CARE_PROVIDER_SITE_OTHER): Payer: BLUE CROSS/BLUE SHIELD | Admitting: Family Medicine

## 2015-04-28 VITALS — BP 124/80 | HR 96 | Temp 98.0°F | Resp 16 | Wt 146.4 lb

## 2015-04-28 DIAGNOSIS — F418 Other specified anxiety disorders: Secondary | ICD-10-CM | POA: Diagnosis not present

## 2015-04-28 DIAGNOSIS — E119 Type 2 diabetes mellitus without complications: Secondary | ICD-10-CM | POA: Diagnosis not present

## 2015-04-28 DIAGNOSIS — F329 Major depressive disorder, single episode, unspecified: Secondary | ICD-10-CM

## 2015-04-28 DIAGNOSIS — F32A Depression, unspecified: Secondary | ICD-10-CM

## 2015-04-28 DIAGNOSIS — F419 Anxiety disorder, unspecified: Secondary | ICD-10-CM

## 2015-04-28 HISTORY — DX: Type 2 diabetes mellitus without complications: E11.9

## 2015-04-28 NOTE — Assessment & Plan Note (Signed)
Unchanged from previous.  Pt doesn't want to change meds but we discussed decreasing dose due to increased 1/2 life in liver dysfxn.  Pt will take her current prescription of 40mg  every other day and then when she is out of meds, will decrease dose to 20mg .  Pt expressed understanding and is in agreement w/ plan.

## 2015-04-28 NOTE — Assessment & Plan Note (Signed)
New dx for pt based on last labs (A1C 6.6).  She is not on ACE/ARB- will get microalbumin.  Pt is due for eye exam- encouraged her to schedule.  Pt admits to poor dietary habits and limited exercise.  Check labs.  Adjust treatment plan as needed.  Stressed need for compliance.  Will follow.

## 2015-04-28 NOTE — Progress Notes (Signed)
   Subjective:    Patient ID: [name] [name], female    DOB: [birth year], 64 y.o.   MRN: [id number]  HPI Med f/u- pt was initially interested in coming off Prozac b/c of her liver issues.  Pt decided that she wants to stay on the medication but would prefer to decrease her dose.  Diabetes- new dx for pt in Feb.  Pt did not return as scheduled.  Not following a particular diet.  Not getting regular exercise.  Not on ACE/ARB.  Had last eye exam 1 yr ago.   Review of Systems For ROS see HPI     Objective:   Physical Exam  Constitutional: She is oriented to person, place, and time. She appears well-developed and well-nourished. No distress.  HENT:  Head: Normocephalic and atraumatic.  Eyes: Conjunctivae and EOM are normal. Pupils are equal, round, and reactive to light.  Neck: Normal range of motion. Neck supple. No thyromegaly present.  Cardiovascular: Normal rate, regular rhythm, normal heart sounds and intact distal pulses.   No murmur heard. Pulmonary/Chest: Effort normal and breath sounds normal. No respiratory distress.  Abdominal: Soft. She exhibits no distension. There is no tenderness.  Musculoskeletal: She exhibits no edema.  Lymphadenopathy:    She has no cervical adenopathy.  Neurological: She is alert and oriented to person, place, and time.  Skin: Skin is warm and dry.  Psychiatric: She has a normal mood and affect. Her behavior is normal.  Vitals reviewed.         Assessment & Plan:

## 2015-04-28 NOTE — Progress Notes (Signed)
Pre visit review using our clinic review tool, if applicable. No additional management support is needed unless otherwise documented below in the visit note. 

## 2015-04-28 NOTE — Patient Instructions (Signed)
Schedule your complete physical in 3-4 months We'll notify you of your lab results and make any changes if needed Decrease the Prozac to every other day- when you are out of your current medication, let me know so I can decrease the dose to 20mg  Try and get regular exercise and make healthy food choices Call with any questions or concerns Hang in there!

## 2015-04-29 LAB — BASIC METABOLIC PANEL
BUN: 22 mg/dL (ref 6–23)
CHLORIDE: 102 meq/L (ref 96–112)
CO2: 27 mEq/L (ref 19–32)
Calcium: 10 mg/dL (ref 8.4–10.5)
Creatinine, Ser: 1 mg/dL (ref 0.40–1.20)
GFR: 59.27 mL/min — ABNORMAL LOW (ref >60.00)
Glucose, Bld: 119 mg/dL — ABNORMAL HIGH (ref 70–99)
POTASSIUM: 4.3 meq/L (ref 3.5–5.1)
SODIUM: 138 meq/L (ref 135–145)

## 2015-04-29 LAB — HEPATIC FUNCTION PANEL
ALT: 92 U/L — ABNORMAL HIGH (ref 0–35)
AST: 69 U/L — AB (ref 0–37)
Albumin: 4.3 g/dL (ref 3.5–5.2)
Alkaline Phosphatase: 77 U/L (ref 39–117)
BILIRUBIN TOTAL: 0.6 mg/dL (ref 0.2–1.2)
Bilirubin, Direct: 0.1 mg/dL (ref 0.0–0.3)
Total Protein: 7.3 g/dL (ref 6.0–8.3)

## 2015-04-29 LAB — MICROALBUMIN / CREATININE URINE RATIO
Creatinine,U: 141.1 mg/dL
Microalb Creat Ratio: 1 mg/g (ref 0.0–30.0)
Microalb, Ur: 1.4 mg/dL (ref 0.0–1.9)

## 2015-04-29 LAB — HEMOGLOBIN A1C: Hgb A1c MFr Bld: 6 % (ref 4.6–6.5)

## 2015-04-29 LAB — TSH: TSH: 1.82 u[IU]/mL (ref 0.35–4.50)

## 2015-05-05 ENCOUNTER — Ambulatory Visit (HOSPITAL_COMMUNITY)
Admission: RE | Admit: 2015-05-05 | Discharge: 2015-05-05 | Disposition: A | Discharge status: Home / Self Care | Payer: BLUE CROSS/BLUE SHIELD | Source: Ambulatory Visit | Attending: Gastroenterology | Admitting: Gastroenterology

## 2015-05-05 DIAGNOSIS — R162 Hepatomegaly with splenomegaly, not elsewhere classified: Secondary | ICD-10-CM | POA: Insufficient documentation

## 2015-05-05 DIAGNOSIS — Z1211 Encounter for screening for malignant neoplasm of colon: Secondary | ICD-10-CM

## 2015-05-05 DIAGNOSIS — K220 Achalasia of cardia: Secondary | ICD-10-CM

## 2015-05-25 ENCOUNTER — Ambulatory Visit (INDEPENDENT_AMBULATORY_CARE_PROVIDER_SITE_OTHER): Payer: BLUE CROSS/BLUE SHIELD | Admitting: Gastroenterology

## 2015-05-25 ENCOUNTER — Encounter: Payer: Self-pay | Admitting: Gastroenterology

## 2015-05-25 VITALS — BP 114/68 | HR 80 | Ht 60.24 in | Wt 147.2 lb

## 2015-05-25 DIAGNOSIS — Z1211 Encounter for screening for malignant neoplasm of colon: Secondary | ICD-10-CM

## 2015-05-25 DIAGNOSIS — R162 Hepatomegaly with splenomegaly, not elsewhere classified: Secondary | ICD-10-CM | POA: Diagnosis not present

## 2015-05-25 MED ORDER — NA SULFATE-K SULFATE-MG SULF 17.5-3.13-1.6 GM/177ML PO SOLN: 1.0000 | Freq: Once | ORAL | Status: DC

## 2015-05-25 NOTE — Assessment & Plan Note (Signed)
Follow-up colonoscopy was recommended

## 2015-05-25 NOTE — Progress Notes (Signed)
      History of Present Illness:  Ms. Denapoli has returned to discuss test results.  Elastography demonstrated fibrosis level F2/F3  with moderate risk for fibrosis.  The patient has no GI complaints.    Review of Systems: Pertinent positive and negative review of systems were noted in the above HPI section. All other review of systems were otherwise negative.    Current Medications, Allergies, Past Medical History, Past Surgical History, Family History and Social History were reviewed in [place] record  Vital signs were reviewed in today's medical record. Physical Exam: General: Well developed , well nourished, no acute distress   See Assessment and Plan under Problem List

## 2015-05-25 NOTE — Patient Instructions (Signed)

## 2015-05-25 NOTE — Assessment & Plan Note (Signed)
Hepatosplenomegaly is likely due to hepatic steatosis.  She probably has [name] [name] but not cirrhosis.  Hepatic synthetic function is normal.  Findings were explained to the patient with the suggestion that she attempt weight loss.  Questions were answered.  20  minutes were spent with the patient and/or family.  Greater than 50% was spent in counseling and coordination of care with the patient

## 2015-08-05 ENCOUNTER — Encounter: Payer: BLUE CROSS/BLUE SHIELD | Admitting: Gastroenterology

## 2016-01-13 ENCOUNTER — Ambulatory Visit (INDEPENDENT_AMBULATORY_CARE_PROVIDER_SITE_OTHER): Payer: BLUE CROSS/BLUE SHIELD | Admitting: Family Medicine

## 2016-01-13 ENCOUNTER — Encounter: Payer: Self-pay | Admitting: Family Medicine

## 2016-01-13 ENCOUNTER — Inpatient Hospital Stay (HOSPITAL_BASED_OUTPATIENT_CLINIC_OR_DEPARTMENT_OTHER): Admission: RE | Admit: 2016-01-13 | Payer: BLUE CROSS/BLUE SHIELD | Source: Ambulatory Visit

## 2016-01-13 VITALS — BP 116/68 | HR 75 | Temp 98.4°F | Ht 60.5 in | Wt 146.4 lb

## 2016-01-13 DIAGNOSIS — R109 Unspecified abdominal pain: Secondary | ICD-10-CM

## 2016-01-13 DIAGNOSIS — R1013 Epigastric pain: Secondary | ICD-10-CM

## 2016-01-13 DIAGNOSIS — E559 Vitamin D deficiency, unspecified: Secondary | ICD-10-CM | POA: Diagnosis not present

## 2016-01-13 DIAGNOSIS — K760 Fatty (change of) liver, not elsewhere classified: Secondary | ICD-10-CM

## 2016-01-13 DIAGNOSIS — R1011 Right upper quadrant pain: Secondary | ICD-10-CM

## 2016-01-13 DIAGNOSIS — F4323 Adjustment disorder with mixed anxiety and depressed mood: Secondary | ICD-10-CM

## 2016-01-13 LAB — POCT URINALYSIS DIPSTICK
Bilirubin, UA: NEGATIVE
Blood, UA: NEGATIVE
Glucose, UA: NEGATIVE
Leukocytes, UA: NEGATIVE
Nitrite, UA: NEGATIVE
Protein, UA: NEGATIVE
Spec Grav, UA: 1.025
UROBILINOGEN UA: 0.2
pH, UA: 6

## 2016-01-13 LAB — COMPREHENSIVE METABOLIC PANEL
ALT: 64 U/L — ABNORMAL HIGH (ref 0–35)
AST: 53 U/L — ABNORMAL HIGH (ref 0–37)
Albumin: 4.3 g/dL (ref 3.5–5.2)
Alkaline Phosphatase: 84 U/L (ref 39–117)
BILIRUBIN TOTAL: 0.8 mg/dL (ref 0.2–1.2)
BUN: 15 mg/dL (ref 6–23)
CALCIUM: 10.1 mg/dL (ref 8.4–10.5)
CHLORIDE: 103 meq/L (ref 96–112)
CO2: 28 meq/L (ref 19–32)
Creatinine, Ser: 0.88 mg/dL (ref 0.40–1.20)
GFR: 68.54 mL/min (ref >60.00)
Glucose, Bld: 109 mg/dL — ABNORMAL HIGH (ref 70–99)
Potassium: 4.5 mEq/L (ref 3.5–5.1)
Sodium: 137 mEq/L (ref 135–145)
Total Protein: 7.2 g/dL (ref 6.0–8.3)

## 2016-01-13 LAB — HEPATIC FUNCTION PANEL
ALBUMIN: 4.3 g/dL (ref 3.5–5.2)
ALT: 64 U/L — ABNORMAL HIGH (ref 0–35)
AST: 53 U/L — ABNORMAL HIGH (ref 0–37)
Alkaline Phosphatase: 84 U/L (ref 39–117)
BILIRUBIN DIRECT: 0.1 mg/dL (ref 0.0–0.3)
TOTAL PROTEIN: 7.2 g/dL (ref 6.0–8.3)
Total Bilirubin: 0.8 mg/dL (ref 0.2–1.2)

## 2016-01-13 LAB — LDL CHOLESTEROL, DIRECT: LDL DIRECT: 61 mg/dL

## 2016-01-13 LAB — CBC WITH DIFFERENTIAL/PLATELET
Basophils Absolute: 0 10*3/uL (ref 0.0–0.1)
Basophils Relative: 0.4 % (ref 0.0–3.0)
Eosinophils Absolute: 0.4 10*3/uL (ref 0.0–0.7)
Eosinophils Relative: 5.1 % — ABNORMAL HIGH (ref 0.0–5.0)
HCT: 42.2 % (ref 36.0–46.0)
Hemoglobin: 14.6 g/dL (ref 12.0–15.0)
LYMPHS PCT: 24.6 % (ref 12.0–46.0)
Lymphs Abs: 1.8 10*3/uL (ref 0.7–4.0)
MCHC: 34.8 g/dL (ref 30.0–36.0)
MCV: 86.5 fl (ref 78.0–100.0)
Monocytes Absolute: 0.5 10*3/uL (ref 0.1–1.0)
Monocytes Relative: 7.4 % (ref 3.0–12.0)
NEUTROS ABS: 4.5 10*3/uL (ref 1.4–7.7)
Neutrophils Relative %: 62.5 % (ref 43.0–77.0)
PLATELETS: 219 10*3/uL (ref 150.0–400.0)
RBC: 4.87 Mil/uL (ref 3.87–5.11)
RDW: 12.2 % (ref 11.5–15.5)
WBC: 7.1 10*3/uL (ref 4.0–10.5)

## 2016-01-13 LAB — LIPID PANEL
CHOL/HDL RATIO: 4
CHOLESTEROL: 150 mg/dL (ref 0–200)
HDL: 35 mg/dL — ABNORMAL LOW (ref >39.00)
NonHDL: 115.35
Triglycerides: 270 mg/dL — ABNORMAL HIGH (ref 0.0–149.0)
VLDL: 54 mg/dL — ABNORMAL HIGH (ref 0.0–40.0)

## 2016-01-13 LAB — H. PYLORI ANTIBODY, IGG: H PYLORI IGG: NEGATIVE

## 2016-01-13 LAB — TSH: TSH: 1.57 u[IU]/mL (ref 0.35–4.50)

## 2016-01-13 LAB — VITAMIN D 25 HYDROXY (VIT D DEFICIENCY, FRACTURES): VITD: 32.52 ng/mL (ref 30.00–100.00)

## 2016-01-13 MED ORDER — RANITIDINE HCL 150 MG PO TABS: 150.0000 mg | ORAL_TABLET | Freq: Two times a day (BID) | ORAL | Status: DC

## 2016-01-13 MED ORDER — OMEPRAZOLE 20 MG PO CPDR: 20.0000 mg | DELAYED_RELEASE_CAPSULE | Freq: Every day | ORAL | Status: DC

## 2016-01-13 NOTE — Patient Instructions (Signed)
Food Choices for Gastroesophageal Reflux Disease, Adult When you have gastroesophageal reflux disease (GERD), the foods you eat and your eating habits are very important. Choosing the right foods can help ease the discomfort of GERD. WHAT GENERAL GUIDELINES DO I NEED TO FOLLOW?  Choose fruits, vegetables, whole grains, low-fat dairy products, and low-fat meat, fish, and poultry.  Limit fats such as oils, salad dressings, butter, nuts, and avocado.  Keep a food diary to identify foods that cause symptoms.  Avoid foods that cause reflux. These may be different for different people.  Eat frequent small meals instead of three large meals each day.  Eat your meals slowly, in a relaxed setting.  Limit fried foods.  Cook foods using methods other than frying.  Avoid drinking alcohol.  Avoid drinking large amounts of liquids with your meals.  Avoid bending over or lying down until 2-3 hours after eating. WHAT FOODS ARE NOT RECOMMENDED? The following are some foods and drinks that may worsen your symptoms: Vegetables Tomatoes. Tomato juice. Tomato and spaghetti sauce. Chili peppers. Onion and garlic. Horseradish. Fruits Oranges, grapefruit, and lemon (fruit and juice). Meats High-fat meats, fish, and poultry. This includes hot dogs, ribs, ham, sausage, salami, and bacon. Dairy Whole milk and chocolate milk. Sour cream. Cream. Butter. Ice cream. Cream cheese.  Beverages Coffee and tea, with or without caffeine. Carbonated beverages or energy drinks. Condiments Hot sauce. Barbecue sauce.  Sweets/Desserts Chocolate and cocoa. Donuts. Peppermint and spearmint. Fats and Oils High-fat foods, including French fries and potato chips. Other Vinegar. Strong spices, such as black pepper, white pepper, red pepper, cayenne, curry powder, cloves, ginger, and chili powder. The items listed above may not be a complete list of foods and beverages to avoid. Contact your dietitian for more  information.   This information is not intended to replace advice given to you by your health care provider. Make sure you discuss any questions you have with your health care provider.   Document Released: 10/02/2005 Document Revised: 10/23/2014 Document Reviewed: 08/06/2013 Elsevier Interactive Patient Education 2016 Elsevier Inc.  

## 2016-01-13 NOTE — Progress Notes (Signed)
Pre visit review using our clinic review tool, if applicable. No additional management support is needed unless otherwise documented below in the visit note. 

## 2016-01-16 NOTE — Progress Notes (Signed)
Patient ID: [name] [name], female    DOB: 1951-05-09  Age: 65 y.o. MRN: [id number]    Subjective:  Subjective HPI [name] [name] presents for c/o excessive sweating and abd pain.  No nvd.  No fevers.  Pain comes and goes-- worse with fatty foods.   Review of Systems  Constitutional: Negative for diaphoresis, appetite change, fatigue and unexpected weight change.  Eyes: Negative for pain, redness and visual disturbance.  Respiratory: Negative for cough, chest tightness, shortness of breath and wheezing.   Cardiovascular: Negative for chest pain, palpitations and leg swelling.  Endocrine: Negative for cold intolerance, heat intolerance, polydipsia, polyphagia and polyuria.  Genitourinary: Negative for dysuria, frequency and difficulty urinating.  Neurological: Negative for dizziness, light-headedness, numbness and headaches.    History Past Medical History  Diagnosis Date  . GERD (gastroesophageal reflux disease)   . Hyperlipidemia   . Diverticulosis   . Achalasia   . Dysphagia   . Anxiety and depression   . Status post dilation of esophageal narrowing   . Hepatosplenomegaly   . Diabetes type 2, controlled (Max) 04/28/2015    She has past surgical history that includes Laparoscopic myotomy.   Her family history includes Heart disease in her father; Hypertension in her mother. There is no history of Colon cancer, Colon polyps, Diabetes, or Kidney disease.She reports that she has quit smoking. She has never used smokeless tobacco. She reports that she does not drink alcohol or use illicit drugs.  Current Outpatient Prescriptions on File Prior to Visit  Medication Sig Dispense Refill  . milk thistle 175 MG tablet Take 175 mg by mouth daily.     No current facility-administered medications on file prior to visit.     Objective:  Objective Physical Exam  Constitutional: She is oriented to person, place, and time. She appears well-developed and well-nourished.  HENT:  Head:  Normocephalic and atraumatic.  Nose: Mucosal edema, rhinorrhea and sinus tenderness present. No nasal deformity. Right sinus exhibits maxillary sinus tenderness and frontal sinus tenderness. Left sinus exhibits maxillary sinus tenderness and frontal sinus tenderness.  Mouth/Throat: Oropharynx is clear and moist and mucous membranes are normal. No oropharyngeal exudate.  Eyes: Conjunctivae and EOM are normal.  Neck: Normal range of motion. Neck supple. No JVD present. Carotid bruit is not present. No thyromegaly present.  Cardiovascular: Normal rate, regular rhythm and normal heart sounds.   No murmur heard. Pulmonary/Chest: Effort normal and breath sounds normal. No respiratory distress. She has no wheezes. She has no rales. She exhibits no tenderness.  Musculoskeletal: She exhibits no edema.  Lymphadenopathy:    She has no cervical adenopathy.  Neurological: She is alert and oriented to person, place, and time.  Skin: Skin is warm. She is diaphoretic.  Psychiatric: She has a normal mood and affect.   BP 116/68 mmHg  Pulse 75  Temp(Src) 98.4 F (36.9 C) (Oral)  Ht 5' 0.5" (1.537 m)  Wt 146 lb 6.4 oz (66.407 kg)  BMI 28.11 kg/m2  SpO2 97% Wt Readings from Last 3 Encounters:  01/13/16 146 lb 6.4 oz (66.407 kg)  05/25/15 147 lb 4 oz (66.792 kg)  04/28/15 146 lb 6 oz (66.395 kg)     Lab Results  Component Value Date   WBC 7.1 01/13/2016   HGB 14.6 01/13/2016   HCT 42.2 01/13/2016   PLT 219.0 01/13/2016   GLUCOSE 109* 01/13/2016   CHOL 150 01/13/2016   TRIG 270.0* 01/13/2016   HDL 35.00* 01/13/2016   LDLDIRECT 61.0 01/13/2016  [place] 95 12/21/2010   ALT 64* 01/13/2016   ALT 64* 01/13/2016   AST 53* 01/13/2016   AST 53* 01/13/2016   NA 137 01/13/2016   K 4.5 01/13/2016   CL 103 01/13/2016   CREATININE 0.88 01/13/2016   BUN 15 01/13/2016   CO2 28 01/13/2016   TSH 1.57 01/13/2016   INR 1.0 03/11/2015   HGBA1C 6.0 04/28/2015   MICROALBUR 1.4 04/28/2015    US Abdomen  Complete W/elastography  05/05/2015  CLINICAL DATA:  Hepatosplenomegaly EXAM: ULTRASOUND ABDOMEN COMPLETE ULTRASOUND HEPATIC ELASTOGRAPHY TECHNIQUE: Sonography of the upper abdomen was performed. In addition, ultrasound elastography evaluation of the liver was performed. A region of interest was placed within the right lobe of the liver. Following application of a compressive sonographic pulse, shear waves were detected in the adjacent hepatic tissue and the shear wave velocity was calculated. Multiple assessments were performed at the selected site. Median shear wave velocity is correlated to a Metavir fibrosis score. COMPARISON:  Abdominal ultrasound 12/07/2014 FINDINGS: ULTRASOUND ABDOMEN Gallbladder: No gallstones or wall thickening visualized. No sonographic Murphy sign noted. Common bile duct: Diameter: 6 mm Liver: Hepatic increased echogenicity likely indicates underlying steatosis without focal abnormality or intrahepatic ductal dilatation. IVC: No abnormality visualized. Pancreas: Visualized portion unremarkable. Spleen: Size and appearance within normal limits. Right Kidney: Length: 10.3 cm. Echogenicity within normal limits. No hydronephrosis. Echogenic lesion measuring 9 x 8 x 4 mm within the right upper pole cortex is noted which could be artifactual but could correspond to a hypodense lesion seen on prior exam and could represent an angiomyolipoma. Left Kidney: Length: 10.1 cm. Echogenicity within normal limits. No mass or hydronephrosis visualized. Abdominal aorta: No aneurysm visualized. Other findings: None. ULTRASOUND HEPATIC ELASTOGRAPHY Device: Siemens Helix VTQ Transducer 6C1 Patient position: Supine Number of measurements:  10 Hepatic Segment:  8 Median velocity:   2.12  m/sec IQR: 0.41 IQR/Median velocity ratio 0.19 Corresponding Metavir fibrosis score:  F 2/F 3 Risk of fibrosis: Moderate Limitations of exam: None Pertinent findings noted on other imaging exams:  None Please note that  abnormal shear wave velocities may also be identified in clinical settings other than with hepatic fibrosis, such as: acute hepatitis, elevated right heart and central venous pressures including use of beta blockers, veno-occlusive disease (Budd-Chiari), infiltrative processes such as mastocytosis/amyloidosis/infiltrative tumor, extrahepatic cholestasis, in the post-prandial state, and liver transplantation. Correlation with patient history, laboratory data, and clinical condition recommended. IMPRESSION: Diffusely markedly increased hepatic echogenicity which may suggest steatosis or other infiltrative disorders. Median hepatic shear wave velocity is calculated at 2.12 m/sec. Corresponding Metavir fibrosis score is F 2/F 3. Risk of fibrosis is moderate. Follow-up:  Additional testing appropriate Sub cm echogenic right upper renal pole cortical lesion which could correspond to a hypodense lesion seen on dissimilar prior exam and therefore could represent an angiomyolipoma. Electronically Signed   By: Conchita Paris M.D.   On: 05/05/2015 14:54     Assessment & Plan:  Plan I have discontinued Ms. Choma Na Sulfate-K Sulfate-Mg Sulf and omeprazole. I am also having her start on ranitidine. Additionally, I am having her maintain her milk thistle and FLUoxetine.  Meds ordered this encounter  Medications  . FLUoxetine (PROZAC) 20 MG tablet    Sig: Take 1 tablet by mouth daily.  Marland Kitchen DISCONTD: omeprazole (PRILOSEC) 20 MG capsule    Sig: Take 1 capsule (20 mg total) by mouth daily.    Dispense:  30 capsule    Refill:  3  . ranitidine (  ZANTAC) 150 MG tablet    Sig: Take 1 tablet (150 mg total) by mouth 2 (two) times daily.    Dispense:  60 tablet    Refill:  2    Problem List Items Addressed This Visit    None    Visit Diagnoses    Dyspepsia    -  Primary    Relevant Medications    ranitidine (ZANTAC) 150 MG tablet    Other Relevant Orders    Ambulatory referral to Gastroenterology    TSH  (Completed)    POCT urinalysis dipstick (Completed)    Lipid panel (Completed)    Hepatic function panel (Completed)    CBC with Differential/Platelet (Completed)    Comprehensive metabolic panel (Completed)    Vitamin D (25 hydroxy) (Completed)    H. pylori antibody, IgG (Completed)    Right upper quadrant pain        Relevant Orders    US Abdomen Complete    TSH (Completed)    POCT urinalysis dipstick (Completed)    Lipid panel (Completed)    Hepatic function panel (Completed)    CBC with Differential/Platelet (Completed)    Comprehensive metabolic panel (Completed)    Vitamin D (25 hydroxy) (Completed)    Abdominal pain, unspecified abdominal location        Relevant Orders    US Abdomen Complete    TSH (Completed)    POCT urinalysis dipstick (Completed)    Lipid panel (Completed)    Hepatic function panel (Completed)    CBC with Differential/Platelet (Completed)    Comprehensive metabolic panel (Completed)    Vitamin D (25 hydroxy) (Completed)    H. pylori antibody, IgG (Completed)    Vitamin D deficiency        Relevant Orders    Vitamin D (25 hydroxy) (Completed)    Fatty liver        Relevant Orders    Comprehensive metabolic panel (Completed)       Follow-up: Return if symptoms worsen or fail to improve.  Ann Held, DO

## 2016-01-17 ENCOUNTER — Telehealth: Payer: Self-pay | Admitting: Internal Medicine

## 2016-01-17 ENCOUNTER — Ambulatory Visit (HOSPITAL_BASED_OUTPATIENT_CLINIC_OR_DEPARTMENT_OTHER): Admission: RE | Admit: 2016-01-17 | Payer: BLUE CROSS/BLUE SHIELD | Source: Ambulatory Visit

## 2016-01-17 NOTE — Telephone Encounter (Signed)
OK I will see her 

## 2016-01-17 NOTE — Telephone Encounter (Signed)
Pt is sch'd for 03-15-16 with Dr. Carlean Purl and Pt is aware

## 2016-02-16 ENCOUNTER — Other Ambulatory Visit: Payer: Self-pay

## 2016-02-16 ENCOUNTER — Telehealth: Payer: Self-pay | Admitting: Family Medicine

## 2016-02-16 DIAGNOSIS — Z1231 Encounter for screening mammogram for malignant neoplasm of breast: Secondary | ICD-10-CM

## 2016-02-16 NOTE — Telephone Encounter (Signed)
Relation to [id number] Call back number:[phone]   Reason for call:  Patient requesting an abdominal u/s order patient states when PCP placed order she never went therefore imaging states she needs a new order. Please advise

## 2016-02-17 NOTE — Telephone Encounter (Signed)
I made the patient aware that the order os good until next year and all she needs to do is call Radiology to schedule the apt. She verbalized understanding and has agreed to do so.     KP

## 2016-02-21 ENCOUNTER — Other Ambulatory Visit: Payer: Self-pay | Admitting: Family Medicine

## 2016-02-21 DIAGNOSIS — R1010 Upper abdominal pain, unspecified: Secondary | ICD-10-CM

## 2016-02-23 ENCOUNTER — Ambulatory Visit (HOSPITAL_BASED_OUTPATIENT_CLINIC_OR_DEPARTMENT_OTHER)
Admission: RE | Admit: 2016-02-23 | Discharge: 2016-02-23 | Disposition: A | Discharge status: Home / Self Care | Payer: BLUE CROSS/BLUE SHIELD | Source: Ambulatory Visit | Attending: Family Medicine | Admitting: Family Medicine

## 2016-02-23 DIAGNOSIS — K760 Fatty (change of) liver, not elsewhere classified: Secondary | ICD-10-CM | POA: Diagnosis not present

## 2016-02-23 DIAGNOSIS — R93421 Abnormal radiologic findings on diagnostic imaging of right kidney: Secondary | ICD-10-CM | POA: Diagnosis not present

## 2016-02-23 DIAGNOSIS — R1010 Upper abdominal pain, unspecified: Secondary | ICD-10-CM | POA: Diagnosis present

## 2016-03-01 ENCOUNTER — Other Ambulatory Visit: Payer: Self-pay | Admitting: General Practice

## 2016-03-01 DIAGNOSIS — F4323 Adjustment disorder with mixed anxiety and depressed mood: Secondary | ICD-10-CM

## 2016-03-01 MED ORDER — FLUOXETINE HCL 20 MG PO TABS: 20.0000 mg | ORAL_TABLET | Freq: Every day | ORAL | Status: DC

## 2016-03-15 ENCOUNTER — Encounter: Payer: Self-pay | Admitting: Internal Medicine

## 2016-03-15 ENCOUNTER — Other Ambulatory Visit (INDEPENDENT_AMBULATORY_CARE_PROVIDER_SITE_OTHER): Payer: BLUE CROSS/BLUE SHIELD

## 2016-03-15 ENCOUNTER — Ambulatory Visit (INDEPENDENT_AMBULATORY_CARE_PROVIDER_SITE_OTHER): Payer: BLUE CROSS/BLUE SHIELD | Admitting: Internal Medicine

## 2016-03-15 VITALS — BP 110/60 | HR 62 | Ht 60.5 in | Wt 143.0 lb

## 2016-03-15 DIAGNOSIS — K760 Fatty (change of) liver, not elsewhere classified: Secondary | ICD-10-CM

## 2016-03-15 DIAGNOSIS — R162 Hepatomegaly with splenomegaly, not elsewhere classified: Secondary | ICD-10-CM | POA: Diagnosis not present

## 2016-03-15 DIAGNOSIS — R1013 Epigastric pain: Secondary | ICD-10-CM | POA: Diagnosis not present

## 2016-03-15 DIAGNOSIS — K220 Achalasia of cardia: Secondary | ICD-10-CM | POA: Diagnosis not present

## 2016-03-15 DIAGNOSIS — Z1211 Encounter for screening for malignant neoplasm of colon: Secondary | ICD-10-CM

## 2016-03-15 LAB — IGA: IGA: 361 mg/dL (ref 68–378)

## 2016-03-15 NOTE — Progress Notes (Signed)
Subjective:    Patient ID: Charlisse Hoberg, female    DOB: [birth year], 65 y.o.   MRN: [id number] Cc: indigestion, fatty liver HPI This is a very nice middle-aged white woman originally from [place] with a history of fatty liver, achalasia status post laparoscopic Heller myotomy, and chronic intermittent dyspepsia. She used to see Dr. Deatra Ina. She has problems with indigestion about once every 10 days or so. She relates it to eating things that she "shouldn't". With the achalasia there is still some rare intermittent dysphagia. There is no unintentional weight loss. In fact she has an increased appetite and is concerned. When she does get GI symptoms she may have some right upper quadrant pain. She is admittedly nervousness and somewhat fearful of getting cancer, she wonders if she could have Barrett's esophagus, or other problems but she does not prefer to have any procedures.  Wt Readings from Last 3 Encounters:  03/15/16 143 lb (64.864 kg)  01/13/16 146 lb 6.4 oz (66.407 kg)  05/25/15 147 lb 4 oz (66.792 kg)   Medications, allergies, past medical history, past surgical history, family history and social history are reviewed and updated in the EMR.   Review of Systems As above    Objective:   Physical Exam @BP  110/60 mmHg  Pulse 62  Ht 5' 0.5" (1.537 m)  Wt 143 lb (64.864 kg)  BMI 27.46 kg/m2@  General:  Well-developed, well-nourished and in no acute distress Eyes:  anicteric. Lungs: Clear to auscultation bilaterally. Heart:  S1S2, no rubs, murmurs, gallops. Abdomen:  soft, non-tender, no palpable hepatosplenomegaly, hernia, or mass and BS+. Does have prominent abdominal wall vv  Extremities:   no edema, cyanosis or clubbing Skin   no rash. No stigmata of chronic liver disease except maybe prominent abdominal wall vv Neuro:  A&O x 3.  Psych:  Mildly anxious  Data Reviewed:  Prior EGD's - last 2012 - achalasia s/p myotomy and wrap YUS 02/2016 CT scan 2016 abd/pelvis and images  iewed 2016-17 labs in EMR PCP notes Old GI notes     Assessment & Plan:   Encounter Diagnoses  Name Primary?  . Achalasia Yes  . Dyspepsia   . Hepatosplenomegaly   . Hepatic steatosis   . Colon cancer screening     Very nice lady with several problems as above.  Her GI sxs are chronic and recurrent but not that frequent. i think they are likely due to achalasia and they seem stable when discussed. She could develop reflux stricture but seems unlikely. She is somewhat fearful of getting cancer - wonders if she has Barrett's esophagus. Achalasia does increase esophageal cancer risk slightly (squamous) but routine surveillance/screening not recommended. She has had several EGD's w/o problems (last 2012). She would like to avoid procedures if she can. We reviewed avoiding roughage and foods that will not pass well.  Not a classic profile for fatty liver but possible - does have belly fat excess. ? If celiac disease possible - will test. Unlikely but reasonable to exclude - prior autoimmune evaluation negative, as well as chronic hepatitis. No good evidence for cirrhosis but CT scan 2016 raised some ? Of portal htn - [place] 5/17 no changes cirrhosis. F2-3 metavir on elastography last year - elastography overestimates fibrosis and cirrhosis usu so maybe not that bad.  She will do iFOBT and has agreed to do colonoscopy if + re: CRC screening.  Re: increased appetite - no clear idea why - doubt GI problems. We discussed empiric PPi  for dyspepsia but she declined and when ? Closer she has a few episodes of sxs/ month and stable pattern.  I appreciate the opportunity to care for this patient. Cc;Yvonne R Carollee Herter, DO

## 2016-03-15 NOTE — Patient Instructions (Signed)
Your physician has requested that you go to the basement for the  lab work before leaving today.   I appreciate the opportunity to care for you. Carl Gessner, MD, FACG  

## 2016-03-16 LAB — TISSUE TRANSGLUTAMINASE, IGA: TISSUE TRANSGLUTAMINASE AB, IGA: 1 U/mL (ref <4)

## 2016-03-17 ENCOUNTER — Ambulatory Visit: Payer: BLUE CROSS/BLUE SHIELD | Admitting: Internal Medicine

## 2016-03-17 NOTE — Progress Notes (Signed)
Quick Note:  Negative test for celiac disease My Chart message sent ______

## 2016-03-21 ENCOUNTER — Telehealth: Payer: Self-pay | Admitting: Family Medicine

## 2016-03-21 ENCOUNTER — Other Ambulatory Visit (INDEPENDENT_AMBULATORY_CARE_PROVIDER_SITE_OTHER): Payer: BLUE CROSS/BLUE SHIELD

## 2016-03-21 DIAGNOSIS — Z1211 Encounter for screening for malignant neoplasm of colon: Secondary | ICD-10-CM | POA: Diagnosis not present

## 2016-03-21 LAB — FECAL OCCULT BLOOD, IMMUNOCHEMICAL: Fecal Occult Bld: NEGATIVE

## 2016-03-21 MED ORDER — FLUOXETINE HCL 20 MG/5ML PO SOLN: 20.0000 mg | Freq: Every day | ORAL | Status: DC

## 2016-03-21 NOTE — Telephone Encounter (Signed)
Please advise if this change is appropriate.    KP

## 2016-03-21 NOTE — Telephone Encounter (Signed)
Rx found in the system, Per Dr.Lowne it is ok to send in the Rx.     KP

## 2016-03-21 NOTE — Telephone Encounter (Signed)
Liquid form is not showing up in epic or epocrates

## 2016-03-21 NOTE — Telephone Encounter (Signed)
Relation to [id number] Call back number:[phone] (H) Pharmacy: [id number] - [place], [place] [phone] (Phone) [phone] (Fax)         Reason for call:  Patient requesting a refill of FLUoxetine (PROZAC) 40 MG in liquid form. Please advise

## 2016-03-22 NOTE — Progress Notes (Signed)
Quick Note:  No blood in stool Recall 1 year FOBT See me 6 months sooner prn ______

## 2016-03-27 ENCOUNTER — Telehealth: Payer: Self-pay | Admitting: Family Medicine

## 2016-03-27 NOTE — Telephone Encounter (Signed)
The patient would like to increase the Prozac to 40 mg due to the 20 mg not fully helping with her depression. Please advise    KP

## 2016-03-27 NOTE — Telephone Encounter (Signed)
Relation to [id number] Call back [place]: [id number] - [place], [place] [phone] (Phone) [phone] (Fax)         Reason for call:  Patient would 40 MG of FLUoxetine (PROZAC) 20 MG/5ML solution instead of 20

## 2016-03-27 NOTE — Telephone Encounter (Signed)
Ok to change to 40 mg daily #30  ---f/u 1 month

## 2016-03-28 MED ORDER — FLUOXETINE HCL 20 MG/5ML PO SOLN: 40.0000 mg | Freq: Every day | ORAL | Status: DC

## 2016-03-28 NOTE — Telephone Encounter (Signed)
Rx sent to pharmacy with new directions and note to d/c previous Rx. Left message for pt to return my call to schedule f/u in 1 month.

## 2016-03-29 ENCOUNTER — Ambulatory Visit
Admission: RE | Admit: 2016-03-29 | Discharge: 2016-03-29 | Disposition: A | Discharge status: Home / Self Care | Payer: BLUE CROSS/BLUE SHIELD | Source: Ambulatory Visit

## 2016-03-29 DIAGNOSIS — Z1231 Encounter for screening mammogram for malignant neoplasm of breast: Secondary | ICD-10-CM

## 2016-03-29 NOTE — Telephone Encounter (Signed)
Spoke with pt and she voices understanding re: new directions / dose. Pt has f/u with PCP for 04/28/16.

## 2016-03-31 ENCOUNTER — Ambulatory Visit (INDEPENDENT_AMBULATORY_CARE_PROVIDER_SITE_OTHER): Payer: BLUE CROSS/BLUE SHIELD | Admitting: Medical

## 2016-03-31 ENCOUNTER — Encounter: Payer: Self-pay | Admitting: Medical

## 2016-03-31 ENCOUNTER — Other Ambulatory Visit: Payer: Self-pay | Admitting: Family Medicine

## 2016-03-31 VITALS — BP 123/70 | HR 85 | Temp 98.4°F | Ht 60.5 in | Wt 144.0 lb

## 2016-03-31 DIAGNOSIS — T7840XA Allergy, unspecified, initial encounter: Secondary | ICD-10-CM

## 2016-03-31 DIAGNOSIS — R928 Other abnormal and inconclusive findings on diagnostic imaging of breast: Secondary | ICD-10-CM

## 2016-03-31 MED ORDER — PREDNISONE 10 MG PO TABS: ORAL_TABLET | ORAL | Status: DC

## 2016-03-31 MED ORDER — HYDROXYZINE HCL 10 MG PO TABS: ORAL_TABLET | ORAL | Status: DC

## 2016-03-31 MED ORDER — METHYLPREDNISOLONE ACETATE 40 MG/ML IJ SUSP
40.0000 mg | Freq: Once | INTRAMUSCULAR | Status: AC
  Administered 2016-03-31: 40 mg via INTRAMUSCULAR

## 2016-03-31 NOTE — Addendum Note (Signed)
Addended by: Vernie Shanks E on: 03/31/2016 11:56 AM   Modules accepted: Orders

## 2016-03-31 NOTE — Progress Notes (Signed)
Pre visit review using our clinic tool,if applicable. No additional management support is needed unless otherwise documented below in the visit note.  

## 2016-03-31 NOTE — Patient Instructions (Addendum)
You recently have likely allergic reaction to poison ivy. We gave you depo-medrol im injection. I am also prescribing oral prednisone and hydroxyzine for itching. Your rash should gradually improve. If worsening or expanding please notify us.  I am giving you modified low dose at your request. If not much improved or residual persisting rash at end of course then you may need extension of prednisone.  Rx advisement hydroxyzine. Use lowest dose possible for itching. No other antihistamines with.  Follow up in 7 days or as needed.

## 2016-03-31 NOTE — Progress Notes (Addendum)
Subjective:    Patient ID: [name] [name], female    DOB: Apr 12, 1951, 66 y.o.   MRN: [id number]  HPI   Pt in states working in her yard recently. She was cutting up brush and trees. She was working without gloves and was in short sleeve shirt. She worked in yard on Monday. Rash came up and gradual increase rash with itching. Pt tried zyrtec and did not help. Benadryl did not help.  Pt request limited treatment as possible.  Pt states heartburn will flare with prednisone.  Pt states allergic reaction to poison ivy in pst. She is convinced she was grabbing poisin ivy the other day.   Review of Systems  Constitutional: Negative for fever, chills and fatigue.  Respiratory: Negative for cough, chest tightness, shortness of breath and wheezing.   Cardiovascular: Negative for chest pain and palpitations.  Gastrointestinal: Negative for abdominal pain.  Musculoskeletal: Negative for back pain.  Skin: Positive for rash.  Neurological: Negative for dizziness, speech difficulty, numbness and headaches.  Hematological: Negative for adenopathy. Does not bruise/bleed easily.  Psychiatric/Behavioral: Negative for behavioral problems.    Past Medical History  Diagnosis Date  . GERD (gastroesophageal reflux disease)   . Hyperlipidemia   . Diverticulosis   . Achalasia   . Dysphagia   . Anxiety and depression   . Status post dilation of esophageal narrowing   . Hepatosplenomegaly   . Diabetes type 2, controlled ([place]) 04/28/2015     Social History   Social History  . Marital Status: Married    Spouse Name: N/A  . Number of Children: N/A  . Years of Education: N/A   Occupational History  . Not on file.   Social History Main Topics  . Smoking status: Former Research scientist (life sciences)  . Smokeless tobacco: Never Used  . Alcohol Use: No  . Drug Use: No  . Sexual Activity: No   Other Topics Concern  . Not on file   Social History Narrative    Past Surgical History  Procedure Laterality Date  .  Laparoscopic myotomy    . Esophagogastroduodenoscopy    . Colonoscopy      Family History  Problem Relation Age of Onset  . Colon cancer Neg Hx   . Hypertension Mother   . Heart disease Father   . Colon polyps Neg Hx   . Diabetes Neg Hx   . Kidney disease Neg Hx     No Known Allergies  Current Outpatient Prescriptions on File Prior to Visit  Medication Sig Dispense Refill  . FLUoxetine (PROZAC) 20 MG/5ML solution Take 10 mLs (40 mg total) by mouth daily. 300 mL 0  . milk thistle 175 MG tablet Take 175 mg by mouth daily.    . Probiotic Product (PROBIOTIC DAILY PO) Take 1 capsule by mouth daily.     No current facility-administered medications on file prior to visit.    BP 123/70 mmHg  Pulse 85  Temp(Src) 98.4 F (36.9 C) (Oral)  Ht 5' 0.5" (1.537 m)  Wt 144 lb (65.318 kg)  BMI 27.65 kg/m2  SpO2 99%       Objective:   Physical Exam   General- No acute distress. Pleasant patient. Neck- Full range of motion, no jvd Lungs- Clear, even and unlabored. Heart- regular rate and rhythm. Neurologic- CNII- XII grossly intact.   Skin- patient has diffuse linear papular rash on both arms running forearms to biceps. Diffuse rash upper chest and face. No vesicles seen.  Assessment & Plan:  You recently have likely allergic reaction to poison ivy. We gave you depo-medrol im injection. I am also prescribing oral prednisone and hydroxyzine for itching. Your rash should gradually improve. If worsening or expanding please notify us.  I am giving you modified low dose at your request. If not much improved or residual persisting rash at end of course then you may need extension of prednisone.  Follow up in 7 days or as needed.  Isam Unrein, Percell Miller, PA-C    Note that pt rash on her face is on both sides. Worse on left. I did educate her that if rash left side of  worsens despite treatment above then notify us. This looks like definite allergic reaction but thought did occur  that pt seems stressed and that very remote possible shingles. So educated her.

## 2016-04-07 ENCOUNTER — Other Ambulatory Visit: Payer: Self-pay | Admitting: Family Medicine

## 2016-04-07 ENCOUNTER — Other Ambulatory Visit: Payer: Self-pay

## 2016-04-07 DIAGNOSIS — R928 Other abnormal and inconclusive findings on diagnostic imaging of breast: Secondary | ICD-10-CM

## 2016-04-10 ENCOUNTER — Ambulatory Visit
Admission: RE | Admit: 2016-04-10 | Discharge: 2016-04-10 | Disposition: A | Discharge status: Home / Self Care | Payer: BLUE CROSS/BLUE SHIELD | Source: Ambulatory Visit | Attending: Family Medicine | Admitting: Family Medicine

## 2016-04-10 DIAGNOSIS — R928 Other abnormal and inconclusive findings on diagnostic imaging of breast: Secondary | ICD-10-CM

## 2016-04-28 ENCOUNTER — Ambulatory Visit (INDEPENDENT_AMBULATORY_CARE_PROVIDER_SITE_OTHER): Payer: BLUE CROSS/BLUE SHIELD | Admitting: Family Medicine

## 2016-04-28 ENCOUNTER — Encounter: Payer: Self-pay | Admitting: Family Medicine

## 2016-04-28 VITALS — BP 109/69 | HR 83 | Temp 98.3°F | Ht 61.0 in | Wt 143.6 lb

## 2016-04-28 DIAGNOSIS — F32A Depression, unspecified: Secondary | ICD-10-CM

## 2016-04-28 DIAGNOSIS — F329 Major depressive disorder, single episode, unspecified: Secondary | ICD-10-CM | POA: Diagnosis not present

## 2016-04-28 MED ORDER — FLUOXETINE HCL 20 MG/5ML PO SOLN: 40.0000 mg | Freq: Every day | ORAL | Status: DC

## 2016-04-28 NOTE — Patient Instructions (Signed)
Generalized Anxiety Disorder Generalized anxiety disorder (GAD) is a mental disorder. It interferes with life functions, including relationships, work, and school. GAD is different from normal anxiety, which everyone experiences at some point in their lives in response to specific life events and activities. Normal anxiety actually helps us prepare for and get through these life events and activities. Normal anxiety goes away after the event or activity is over.  GAD causes anxiety that is not necessarily related to specific events or activities. It also causes excess anxiety in proportion to specific events or activities. The anxiety associated with GAD is also difficult to control. GAD can vary from mild to severe. People with severe GAD can have intense waves of anxiety with physical symptoms (panic attacks).  SYMPTOMS The anxiety and worry associated with GAD are difficult to control. This anxiety and worry are related to many life events and activities and also occur more days than not for 6 months or longer. People with GAD also have three or more of the following symptoms (one or more in children):  Restlessness.   Fatigue.  Difficulty concentrating.   Irritability.  Muscle tension.  Difficulty sleeping or unsatisfying sleep. DIAGNOSIS GAD is diagnosed through an assessment by your health care provider. Your health care provider will ask you questions aboutyour mood,physical symptoms, and events in your life. Your health care provider may ask you about your medical history and use of alcohol or drugs, including prescription medicines. Your health care provider may also do a physical exam and blood tests. Certain medical conditions and the use of certain substances can cause symptoms similar to those associated with GAD. Your health care provider may refer you to a mental health specialist for further evaluation. TREATMENT The following therapies are usually used to treat GAD:    Medication. Antidepressant medication usually is prescribed for long-term daily control. Antianxiety medicines may be added in severe cases, especially when panic attacks occur.   Talk therapy (psychotherapy). Certain types of talk therapy can be helpful in treating GAD by providing support, education, and guidance. A form of talk therapy called cognitive behavioral therapy can teach you healthy ways to think about and react to daily life events and activities.  Stress managementtechniques. These include yoga, meditation, and exercise and can be very helpful when they are practiced regularly. A mental health specialist can help determine which treatment is best for you. Some people see improvement with one therapy. However, other people require a combination of therapies.   This information is not intended to replace advice given to you by your health care provider. Make sure you discuss any questions you have with your health care provider.   Document Released: 01/27/2013 Document Revised: 10/23/2014 Document Reviewed: 01/27/2013 Elsevier Interactive Patient Education 2016 Elsevier Inc.  

## 2016-04-28 NOTE — Progress Notes (Signed)
Patient ID: [name] [name], female    DOB: 1950/11/14  Age: 65 y.o. MRN: [id number]    Subjective:  Subjective HPI [name] [name] presents for f/u depression/ anxiety.  No complaints except some hot flashes occassionally  Review of Systems  Constitutional: Negative for diaphoresis, appetite change, fatigue and unexpected weight change.  Eyes: Negative for pain, redness and visual disturbance.  Respiratory: Negative for cough, chest tightness, shortness of breath and wheezing.   Cardiovascular: Negative for chest pain, palpitations and leg swelling.  Endocrine: Negative for cold intolerance, heat intolerance, polydipsia, polyphagia and polyuria.  Genitourinary: Negative for dysuria, frequency and difficulty urinating.  Neurological: Negative for dizziness, light-headedness, numbness and headaches.    History Past Medical History  Diagnosis Date  . GERD (gastroesophageal reflux disease)   . Hyperlipidemia   . Diverticulosis   . Achalasia   . Dysphagia   . Anxiety and depression   . Status post dilation of esophageal narrowing   . Hepatosplenomegaly   . Diabetes type 2, controlled ([place]) 04/28/2015    She has past surgical history that includes Laparoscopic myotomy; Esophagogastroduodenoscopy; and Colonoscopy.   Her family history includes Heart disease in her father; Hypertension in her mother. There is no history of Colon cancer, Colon polyps, Diabetes, or Kidney disease.She reports that she has quit smoking. She has never used smokeless tobacco. She reports that she does not drink alcohol or use illicit drugs.  Current Outpatient Prescriptions on File Prior to Visit  Medication Sig Dispense Refill  . milk thistle 175 MG tablet Take 175 mg by mouth daily.    . Probiotic Product (PROBIOTIC DAILY PO) Take 1 capsule by mouth daily.     No current facility-administered medications on file prior to visit.     Objective:  Objective Physical Exam  Constitutional: She is oriented to  person, place, and time. She appears well-developed and well-nourished.  HENT:  Head: Normocephalic and atraumatic.  Eyes: Conjunctivae and EOM are normal.  Neck: Normal range of motion. Neck supple. No JVD present. Carotid bruit is not present. No thyromegaly present.  Cardiovascular: Normal rate, regular rhythm and normal heart sounds.   No murmur heard. Pulmonary/Chest: Effort normal and breath sounds normal. No respiratory distress. She has no wheezes. She has no rales. She exhibits no tenderness.  Musculoskeletal: She exhibits no edema.  Neurological: She is alert and oriented to person, place, and time.  Psychiatric: She has a normal mood and affect.  Nursing note and vitals reviewed.  BP 109/69 mmHg  Pulse 83  Temp(Src) 98.3 F (36.8 C) (Oral)  Ht 5\' 1"  (1.549 m)  Wt 143 lb 9.6 oz (65.137 kg)  BMI 27.15 kg/m2  SpO2 97% Wt Readings from Last 3 Encounters:  04/28/16 143 lb 9.6 oz (65.137 kg)  03/31/16 144 lb (65.318 kg)  03/15/16 143 lb (64.864 kg)     Lab Results  Component Value Date   WBC 7.1 01/13/2016   HGB 14.6 01/13/2016   HCT 42.2 01/13/2016   PLT 219.0 01/13/2016   GLUCOSE 109* 01/13/2016   CHOL 150 01/13/2016   TRIG 270.0* 01/13/2016   HDL 35.00* 01/13/2016   LDLDIRECT 61.0 01/13/2016   LDLCALC 95 12/21/2010   ALT 64* 01/13/2016   ALT 64* 01/13/2016   AST 53* 01/13/2016   AST 53* 01/13/2016   NA 137 01/13/2016   K 4.5 01/13/2016   CL 103 01/13/2016   CREATININE 0.88 01/13/2016   BUN 15 01/13/2016   CO2 28 01/13/2016   TSH  1.57 01/13/2016   INR 1.0 03/11/2015   HGBA1C 6.0 04/28/2015   MICROALBUR 1.4 04/28/2015    US Breast [place]  04/10/2016  CLINICAL DATA:  Callback from screening mammogram for possible right breast distortion. EXAM: 2D DIGITAL DIAGNOSTIC RIGHT MAMMOGRAM WITH CAD AND ADJUNCT TOMO ULTRASOUND RIGHT BREAST COMPARISON:  Previous exam(s). ACR Breast Density Category c: The breast tissue is heterogeneously dense,  which may obscure small masses. FINDINGS: MLO spot compression and full lateral views of the right breast were performed with tomosynthesis. On the additional views, the possible distortion identified on screening mammogram does not persist. No suspicious mass or other abnormality is identified in the area of concern. Mammographic images were processed with CAD. On physical exam, no discrete mass is felt in the area of concern within the superior right breast. Targeted ultrasound of the superior right breast was performed demonstrating no suspicious cystic or solid sonographic finding in the area of concern. IMPRESSION: No mammographic or sonographic evidence of malignancy. RECOMMENDATION: Screening mammogram in one year.(Code:SM-B-01Y) I have discussed the findings and recommendations with the patient. Results were also provided in writing at the conclusion of the visit. If applicable, a reminder letter will be sent to the patient regarding the next appointment. BI-RADS CATEGORY  1: Negative. Electronically Signed   By: Pamelia Hoit M.D.   On: 04/10/2016 16:57   Mm Diag Breast Tomo Uni Right  04/10/2016  CLINICAL DATA:  Callback from screening mammogram for possible right breast distortion. EXAM: 2D DIGITAL DIAGNOSTIC RIGHT MAMMOGRAM WITH CAD AND ADJUNCT TOMO ULTRASOUND RIGHT BREAST COMPARISON:  Previous exam(s). ACR Breast Density Category c: The breast tissue is heterogeneously dense, which may obscure small masses. FINDINGS: MLO spot compression and full lateral views of the right breast were performed with tomosynthesis. On the additional views, the possible distortion identified on screening mammogram does not persist. No suspicious mass or other abnormality is identified in the area of concern. Mammographic images were processed with CAD. On physical exam, no discrete mass is felt in the area of concern within the superior right breast. Targeted ultrasound of the superior right breast was performed  demonstrating no suspicious cystic or solid sonographic finding in the area of concern. IMPRESSION: No mammographic or sonographic evidence of malignancy. RECOMMENDATION: Screening mammogram in one year.(Code:SM-B-01Y) I have discussed the findings and recommendations with the patient. Results were also provided in writing at the conclusion of the visit. If applicable, a reminder letter will be sent to the patient regarding the next appointment. BI-RADS CATEGORY  1: Negative. Electronically Signed   By: Pamelia Hoit M.D.   On: 04/10/2016 16:57     Assessment & Plan:  Plan I have discontinued Ms. Keylon predniSONE and hydrOXYzine. I am also having her maintain her milk thistle, Probiotic Product (PROBIOTIC DAILY PO), and FLUoxetine.  Meds ordered this encounter  Medications  . FLUoxetine (PROZAC) 20 MG/5ML solution    Sig: Take 10 mLs (40 mg total) by mouth daily.    Dispense:  300 mL    Refill:  11    Problem List Items Addressed This Visit    None    Visit Diagnoses    Depression    -  Primary    Relevant Medications    FLUoxetine (PROZAC) 20 MG/5ML solution       Follow-up: Return in about 6 months (around 10/29/2016), or if symptoms worsen or fail to improve, for annual exam, fasting.  Ann Held, DO

## 2016-04-28 NOTE — Progress Notes (Signed)
Pre visit review using our clinic review tool, if applicable. No additional management support is needed unless otherwise documented below in the visit note. 

## 2016-06-29 ENCOUNTER — Encounter: Payer: Self-pay | Admitting: Internal Medicine

## 2016-08-01 ENCOUNTER — Encounter: Payer: BLUE CROSS/BLUE SHIELD | Admitting: Family Medicine

## 2016-08-09 ENCOUNTER — Telehealth: Payer: Self-pay | Admitting: Family Medicine

## 2016-08-10 ENCOUNTER — Ambulatory Visit (INDEPENDENT_AMBULATORY_CARE_PROVIDER_SITE_OTHER): Payer: BLUE CROSS/BLUE SHIELD | Admitting: Family Medicine

## 2016-08-10 ENCOUNTER — Encounter: Payer: Self-pay | Admitting: Family Medicine

## 2016-08-10 VITALS — BP 110/62 | HR 84 | Temp 98.3°F | Resp 16 | Ht 61.0 in | Wt 144.0 lb

## 2016-08-10 DIAGNOSIS — E119 Type 2 diabetes mellitus without complications: Secondary | ICD-10-CM | POA: Diagnosis not present

## 2016-08-10 DIAGNOSIS — Z298 Encounter for other specified prophylactic measures: Secondary | ICD-10-CM | POA: Diagnosis not present

## 2016-08-10 DIAGNOSIS — E785 Hyperlipidemia, unspecified: Secondary | ICD-10-CM

## 2016-08-10 DIAGNOSIS — Z2989 Encounter for other specified prophylactic measures: Secondary | ICD-10-CM

## 2016-08-10 MED ORDER — ACETAZOLAMIDE 125 MG PO TABS: 125.0000 mg | ORAL_TABLET | Freq: Two times a day (BID) | ORAL | 0 refills | Status: DC

## 2016-08-10 NOTE — Patient Instructions (Signed)
Altitude Sickness Altitude sickness occurs when a person goes to a high altitude (at least 8,200 ft [2,460 m] above sea level) without first letting the body adjust to the higher altitude (acclimate).Symptoms generally develop within 72 hours of arriving at high altitude. It can happen to anyone, regardless of physical condition. Altitude sickness is a medical emergency that can develop into a life-threatening condition.  CAUSES  Altitude sickness is caused by rapidly ascending to higher altitudes that expose you to lower air pressure and lower oxygen levels. Going to high altitudes quickly and exerting yourself can make altitude sickness more likely to occur.  SYMPTOMS   Severe headache.  Nausea and vomiting.  Shortness of breath.  Dizziness.  Confusion.  Uncoordinated movements.  Fatigue and sleep disturbances.  Weakness.  Hallucinations. DIAGNOSIS  Your caregiver will take your medical history and perform a physical exam. A chest X-ray may also be taken. TREATMENT  In most cases of mild altitude sickness, your symptoms will resolve gradually over 3 to 5 days. If treatment is needed, it begins with moving to a lower altitude (1,800 ft [540 m] above sea level or lower) as quickly and safely as possible. Oxygen and certain medicines may also be given to help with breathing. In severe cases, you may need to stay in the hospital. PREVENTION  To prevent altitude sickness during future trips:  Go to higher altitudes slowly, giving your body time to acclimate.  Go to higher altitudes during the daytime and return to lower altitudes at night.  Give your body a few days to adjust to a change in altitude before starting strenuous physical activities.  Ask your caregiver about medicines you can take to prevent altitude sickness. HOME CARE INSTRUCTIONS   If you must exercise, do so lightly for the first 24 to 36 hours after treatment.  Drink enough fluids to keep your urine clear or  pale yellow.  Eat small, light meals.  Avoid smoking, calming medicines (sedatives), and alcohol.  Remain at a low altitude.  Have someone stay with you until you feel stable.  Keep all follow-up appointments as directed by your caregiver. SEEK IMMEDIATE MEDICAL CARE IF:  You have severe shortness of breath at rest or with exertion.  You have chest pain or tightness.  You have a fast heartbeat.  You have a severe headache.  You have a severe cough.  You have difficulty walking.  You have difficulty concentrating.  You feel confused. MAKE SURE YOU:   Understand these instructions.  Will watch your condition.  Will get help right away if you are not doing well or get worse.   This information is not intended to replace advice given to you by your health care provider. Make sure you discuss any questions you have with your health care provider.   Document Released: 09/29/2000 Document Revised: 04/02/2012 Document Reviewed: 12/01/2011 Elsevier Interactive Patient Education [employer].

## 2016-08-10 NOTE — Progress Notes (Signed)
Pre visit review using our clinic review tool, if applicable. No additional management support is needed unless otherwise documented below in the visit note. 

## 2016-08-10 NOTE — Progress Notes (Signed)
Patient ID: [name] [name], female    DOB: [birth year]  Age: 65 y.o. MRN: [id number]    Subjective:  Subjective  HPI [name] [name] presents for f/u depression.  She is doing well with the prozac, She is also going to [place] and always needs diamox because she gets altitude sickness.    Review of Systems  Constitutional: Negative for appetite change, diaphoresis, fatigue and unexpected weight change.  Eyes: Negative for pain, redness and visual disturbance.  Respiratory: Negative for cough, chest tightness, shortness of breath and wheezing.   Cardiovascular: Negative for chest pain, palpitations and leg swelling.  Endocrine: Negative for cold intolerance, heat intolerance, polydipsia, polyphagia and polyuria.  Genitourinary: Negative for difficulty urinating, dysuria and frequency.  Neurological: Negative for dizziness, light-headedness, numbness and headaches.    History Past Medical History:  Diagnosis Date  . Achalasia   . Anxiety and depression   . Diabetes type 2, controlled ([place]) 04/28/2015  . Diverticulosis   . Dysphagia   . GERD (gastroesophageal reflux disease)   . Hepatosplenomegaly   . Hyperlipidemia   . Status post dilation of esophageal narrowing     She has a past surgical history that includes Laparoscopic myotomy; Esophagogastroduodenoscopy; and Colonoscopy.   Her family history includes Heart disease in her father; Hypertension in her mother.She reports that she has quit smoking. She has never used smokeless tobacco. She reports that she does not drink alcohol or use drugs.  Current Outpatient Prescriptions on File Prior to Visit  Medication Sig Dispense Refill  . FLUoxetine (PROZAC) 20 MG/5ML solution Take 10 mLs (40 mg total) by mouth daily. 300 mL 11  . Probiotic Product (PROBIOTIC DAILY PO) Take 1 capsule by mouth daily.    . milk thistle 175 MG tablet Take 175 mg by mouth daily.     No current facility-administered medications on file prior to visit.        Objective:  Objective  Physical Exam  Constitutional: She is oriented to person, place, and time. She appears well-developed and well-nourished.  HENT:  Head: Normocephalic and atraumatic.  Eyes: Conjunctivae and EOM are normal.  Neck: Normal range of motion. Neck supple. No JVD present. Carotid bruit is not present. No thyromegaly present.  Cardiovascular: Normal rate, regular rhythm and normal heart sounds.   No murmur heard. Pulmonary/Chest: Effort normal and breath sounds normal. No respiratory distress. She has no wheezes. She has no rales. She exhibits no tenderness.  Musculoskeletal: She exhibits no edema.  Neurological: She is alert and oriented to person, place, and time.  Psychiatric: She has a normal mood and affect.  Nursing note and vitals reviewed.  BP 110/62 (BP Location: Right Arm, Patient Position: Sitting, Cuff Size: Normal)   Pulse 84   Temp 98.3 F (36.8 C) (Oral)   Resp 16   Ht 5\' 1"  (1.549 m)   Wt 144 lb (65.3 kg)   SpO2 96%   BMI 27.21 kg/m  Wt Readings from Last 3 Encounters:  08/10/16 144 lb (65.3 kg)  04/28/16 143 lb 9.6 oz (65.1 kg)  03/31/16 144 lb (65.3 kg)     Lab Results  Component Value Date   WBC 7.1 01/13/2016   HGB 14.6 01/13/2016   HCT 42.2 01/13/2016   PLT 219.0 01/13/2016   GLUCOSE 95 08/10/2016   CHOL 185 08/10/2016   TRIG (H) 08/10/2016    821.0 Triglyceride is over 400; calculations on Lipids are invalid.   HDL 32.40 (L) 08/10/2016   LDLDIRECT 86.0 08/10/2016  [place] 95 12/21/2010   ALT 42 (H) 08/10/2016   AST 32 08/10/2016   NA 137 08/10/2016   K 5.0 08/10/2016   CL 102 08/10/2016   CREATININE 0.91 08/10/2016   BUN 20 08/10/2016   CO2 29 08/10/2016   TSH 1.57 01/13/2016   INR 1.0 03/11/2015   HGBA1C 5.9 08/10/2016   MICROALBUR 1.4 04/28/2015    US Breast [place]  Result Date: 04/10/2016 CLINICAL DATA:  Callback from screening mammogram for possible right breast distortion. EXAM: 2D DIGITAL  DIAGNOSTIC RIGHT MAMMOGRAM WITH CAD AND ADJUNCT TOMO ULTRASOUND RIGHT BREAST COMPARISON:  Previous exam(s). ACR Breast Density Category c: The breast tissue is heterogeneously dense, which may obscure small masses. FINDINGS: MLO spot compression and full lateral views of the right breast were performed with tomosynthesis. On the additional views, the possible distortion identified on screening mammogram does not persist. No suspicious mass or other abnormality is identified in the area of concern. Mammographic images were processed with CAD. On physical exam, no discrete mass is felt in the area of concern within the superior right breast. Targeted ultrasound of the superior right breast was performed demonstrating no suspicious cystic or solid sonographic finding in the area of concern. IMPRESSION: No mammographic or sonographic evidence of malignancy. RECOMMENDATION: Screening mammogram in one year.(Code:SM-B-01Y) I have discussed the findings and recommendations with the patient. Results were also provided in writing at the conclusion of the visit. If applicable, a reminder letter will be sent to the patient regarding the next appointment. BI-RADS CATEGORY  1: Negative. Electronically Signed   By: Pamelia Hoit M.D.   On: 04/10/2016 16:57   Mm Diag Breast Tomo Uni Right  Result Date: 04/10/2016 CLINICAL DATA:  Callback from screening mammogram for possible right breast distortion. EXAM: 2D DIGITAL DIAGNOSTIC RIGHT MAMMOGRAM WITH CAD AND ADJUNCT TOMO ULTRASOUND RIGHT BREAST COMPARISON:  Previous exam(s). ACR Breast Density Category c: The breast tissue is heterogeneously dense, which may obscure small masses. FINDINGS: MLO spot compression and full lateral views of the right breast were performed with tomosynthesis. On the additional views, the possible distortion identified on screening mammogram does not persist. No suspicious mass or other abnormality is identified in the area of concern. Mammographic images  were processed with CAD. On physical exam, no discrete mass is felt in the area of concern within the superior right breast. Targeted ultrasound of the superior right breast was performed demonstrating no suspicious cystic or solid sonographic finding in the area of concern. IMPRESSION: No mammographic or sonographic evidence of malignancy. RECOMMENDATION: Screening mammogram in one year.(Code:SM-B-01Y) I have discussed the findings and recommendations with the patient. Results were also provided in writing at the conclusion of the visit. If applicable, a reminder letter will be sent to the patient regarding the next appointment. BI-RADS CATEGORY  1: Negative. Electronically Signed   By: Pamelia Hoit M.D.   On: 04/10/2016 16:57     Assessment & Plan:  Plan  I am having [name] [name] start on acetaZOLAMIDE. I am also having her maintain her milk thistle, Probiotic Product (PROBIOTIC DAILY PO), and FLUoxetine.  Meds ordered this encounter  Medications  . acetaZOLAMIDE (DIAMOX) 125 MG tablet    Sig: Take 1 tablet (125 mg total) by mouth 2 (two) times daily.    Dispense:  12 tablet    Refill:  0    Problem List Items Addressed This Visit    None    Visit Diagnoses  Altitude sickness preventative measures    -  Primary   Relevant Medications   acetaZOLAMIDE (DIAMOX) 125 MG tablet   Diet-controlled diabetes mellitus (HCC)       Relevant Orders   Comprehensive metabolic panel (Completed)   Hemoglobin A1c (Completed)   Lipid panel (Completed)      Follow-up: Return in about 6 months (around 02/08/2017).  Ann Held, DO

## 2016-08-11 LAB — COMPREHENSIVE METABOLIC PANEL
ALK PHOS: 74 U/L (ref 39–117)
ALT: 42 U/L — AB (ref 0–35)
AST: 32 U/L (ref 0–37)
Albumin: 4.4 g/dL (ref 3.5–5.2)
BILIRUBIN TOTAL: 0.6 mg/dL (ref 0.2–1.2)
BUN: 20 mg/dL (ref 6–23)
CALCIUM: 10.4 mg/dL (ref 8.4–10.5)
CO2: 29 meq/L (ref 19–32)
Chloride: 102 mEq/L (ref 96–112)
Creatinine, Ser: 0.91 mg/dL (ref 0.40–1.20)
GFR: 65.82 mL/min (ref >60.00)
GLUCOSE: 95 mg/dL (ref 70–99)
POTASSIUM: 5 meq/L (ref 3.5–5.1)
Sodium: 137 mEq/L (ref 135–145)
Total Protein: 7.2 g/dL (ref 6.0–8.3)

## 2016-08-11 LAB — LIPID PANEL
CHOLESTEROL: 185 mg/dL (ref 0–200)
HDL: 32.4 mg/dL — AB (ref >39.00)
Total CHOL/HDL Ratio: 6

## 2016-08-11 LAB — HEMOGLOBIN A1C: Hgb A1c MFr Bld: 5.9 % (ref 4.6–6.5)

## 2016-08-11 LAB — LDL CHOLESTEROL, DIRECT: LDL DIRECT: 86 mg/dL

## 2016-08-14 ENCOUNTER — Other Ambulatory Visit: Payer: Self-pay

## 2016-08-14 MED ORDER — FENOFIBRATE 160 MG PO TABS: 160.0000 mg | ORAL_TABLET | Freq: Every day | ORAL | 1 refills | Status: DC

## 2016-08-18 ENCOUNTER — Telehealth: Payer: Self-pay | Admitting: Family Medicine

## 2016-08-18 ENCOUNTER — Other Ambulatory Visit: Payer: Self-pay | Admitting: Family Medicine

## 2016-08-18 DIAGNOSIS — Z298 Encounter for other specified prophylactic measures: Secondary | ICD-10-CM

## 2016-08-18 MED ORDER — ACETAZOLAMIDE 125 MG PO TABS: 125.0000 mg | ORAL_TABLET | Freq: Two times a day (BID) | ORAL | 0 refills | Status: DC

## 2016-08-18 NOTE — Telephone Encounter (Signed)
Patient is requesting a refill of acetaZOLAMIDE (DIAMOX) 125 MG tablet Patient states she only needs probably 4 tablets. Patient leaves [place] on Tuesday. She states she will not have enough to last her stay. Please advise.   Pharmacy: [facility]. [place] [id number] Pharmacy Phone: [phone]

## 2016-08-18 NOTE — Telephone Encounter (Signed)
Rx sent 

## 2016-08-18 NOTE — Telephone Encounter (Signed)
Ok to sent in 8 more but I thought I sent in plenty for her trip

## 2016-08-18 NOTE — Telephone Encounter (Signed)
Please advise 

## 2016-08-18 NOTE — Telephone Encounter (Signed)
error:[id number] ° °

## 2016-08-28 ENCOUNTER — Ambulatory Visit (INDEPENDENT_AMBULATORY_CARE_PROVIDER_SITE_OTHER): Payer: BLUE CROSS/BLUE SHIELD

## 2016-08-28 DIAGNOSIS — Z23 Encounter for immunization: Secondary | ICD-10-CM | POA: Diagnosis not present

## 2016-09-05 ENCOUNTER — Telehealth: Payer: Self-pay | Admitting: Family Medicine

## 2016-09-05 NOTE — Telephone Encounter (Signed)
[facility] [phone] [phone]  She would like a call back. She need to have more information about the flu vac that was given to the pt on 11/13. She needs a lot #, Manufacture, exp date, & location on the pt's body?   Please advise

## 2016-09-05 NOTE — Telephone Encounter (Signed)
Called patient. Left message for return call regarding her questions.

## 2016-09-05 NOTE — Telephone Encounter (Signed)
Completed.

## 2016-10-12 NOTE — Telephone Encounter (Signed)
Called [name] [name] at [facility]. Given information she needed regardiing Flu shot.

## 2016-10-17 ENCOUNTER — Encounter: Payer: BLUE CROSS/BLUE SHIELD | Admitting: Family Medicine

## 2016-10-24 ENCOUNTER — Telehealth: Payer: Self-pay | Admitting: *Deleted

## 2016-10-24 DIAGNOSIS — F32A Depression, unspecified: Secondary | ICD-10-CM

## 2016-10-24 DIAGNOSIS — F329 Major depressive disorder, single episode, unspecified: Secondary | ICD-10-CM

## 2016-10-24 MED ORDER — FLUOXETINE HCL 20 MG/5ML PO SOLN: 40.0000 mg | Freq: Every day | ORAL | 1 refills | Status: DC

## 2016-10-24 NOTE — Telephone Encounter (Signed)
Left message on machine to call back to let us know if she is now using [facility]?  We received a fax from them requesting fluoxetine.

## 2016-10-24 NOTE — Telephone Encounter (Signed)
She says that she would like to have sent to [facility]. She says that she need a 90 day supply.   ALSO, she says that she will be out by the time they are received, she would like to know if provider could give her a few via her local pharmacy until?   Pharmacy: [facility] [place]: [phone]

## 2016-10-24 NOTE — Telephone Encounter (Signed)
Patient notified to call her pharmacy for refill until she gets medication through mail and that her rx was sent to pharmacy for refill of fluoxetine.

## 2016-10-31 ENCOUNTER — Encounter: Payer: Self-pay | Admitting: Family Medicine

## 2016-10-31 ENCOUNTER — Other Ambulatory Visit (HOSPITAL_COMMUNITY)
Admission: RE | Admit: 2016-10-31 | Discharge: 2016-10-31 | Disposition: A | Discharge status: Home / Self Care | Payer: BLUE CROSS/BLUE SHIELD | Source: Ambulatory Visit | Attending: Family Medicine | Admitting: Family Medicine

## 2016-10-31 ENCOUNTER — Ambulatory Visit (INDEPENDENT_AMBULATORY_CARE_PROVIDER_SITE_OTHER): Payer: BLUE CROSS/BLUE SHIELD | Admitting: Family Medicine

## 2016-10-31 VITALS — BP 126/70 | HR 98 | Temp 98.2°F | Resp 16 | Ht 60.1 in | Wt 141.8 lb

## 2016-10-31 DIAGNOSIS — Z01419 Encounter for gynecological examination (general) (routine) without abnormal findings: Secondary | ICD-10-CM | POA: Diagnosis present

## 2016-10-31 DIAGNOSIS — Z1151 Encounter for screening for human papillomavirus (HPV): Secondary | ICD-10-CM | POA: Insufficient documentation

## 2016-10-31 DIAGNOSIS — E119 Type 2 diabetes mellitus without complications: Secondary | ICD-10-CM

## 2016-10-31 DIAGNOSIS — E785 Hyperlipidemia, unspecified: Secondary | ICD-10-CM | POA: Diagnosis not present

## 2016-10-31 DIAGNOSIS — Z0000 Encounter for general adult medical examination without abnormal findings: Secondary | ICD-10-CM

## 2016-10-31 DIAGNOSIS — R61 Generalized hyperhidrosis: Secondary | ICD-10-CM

## 2016-10-31 LAB — COMPREHENSIVE METABOLIC PANEL
ALBUMIN: 4.3 g/dL (ref 3.5–5.2)
ALT: 37 U/L — ABNORMAL HIGH (ref 0–35)
AST: 30 U/L (ref 0–37)
Alkaline Phosphatase: 71 U/L (ref 39–117)
BUN: 15 mg/dL (ref 6–23)
CALCIUM: 10 mg/dL (ref 8.4–10.5)
CHLORIDE: 102 meq/L (ref 96–112)
CO2: 25 mEq/L (ref 19–32)
CREATININE: 0.89 mg/dL (ref 0.40–1.20)
GFR: 67.48 mL/min (ref >60.00)
Glucose, Bld: 96 mg/dL (ref 70–99)
POTASSIUM: 4 meq/L (ref 3.5–5.1)
Sodium: 135 mEq/L (ref 135–145)
Total Bilirubin: 0.7 mg/dL (ref 0.2–1.2)
Total Protein: 7.3 g/dL (ref 6.0–8.3)

## 2016-10-31 LAB — POC URINALSYSI DIPSTICK (AUTOMATED)
Bilirubin, UA: NEGATIVE
Blood, UA: NEGATIVE
Glucose, UA: NEGATIVE
KETONES UA: NEGATIVE
Leukocytes, UA: NEGATIVE
Nitrite, UA: NEGATIVE
PROTEIN UA: NEGATIVE
Urobilinogen, UA: 0.2
pH, UA: 6

## 2016-10-31 LAB — LDL CHOLESTEROL, DIRECT: Direct LDL: 101 mg/dL

## 2016-10-31 LAB — TSH: TSH: 2.3 u[IU]/mL (ref 0.35–4.50)

## 2016-10-31 LAB — CBC
HCT: 43.3 % (ref 36.0–46.0)
HEMOGLOBIN: 15.1 g/dL — AB (ref 12.0–15.0)
MCHC: 34.8 g/dL (ref 30.0–36.0)
MCV: 84.3 fl (ref 78.0–100.0)
PLATELETS: 245 10*3/uL (ref 150.0–400.0)
RBC: 5.14 Mil/uL — AB (ref 3.87–5.11)
RDW: 12.6 % (ref 11.5–15.5)
WBC: 8.3 10*3/uL (ref 4.0–10.5)

## 2016-10-31 LAB — LIPID PANEL
CHOL/HDL RATIO: 5
CHOLESTEROL: 190 mg/dL (ref 0–200)
HDL: 37.3 mg/dL — ABNORMAL LOW (ref >39.00)
NONHDL: 152.94
Triglycerides: 258 mg/dL — ABNORMAL HIGH (ref 0.0–149.0)
VLDL: 51.6 mg/dL — AB (ref 0.0–40.0)

## 2016-10-31 LAB — T4, FREE: Free T4: 0.79 ng/dL (ref 0.60–1.60)

## 2016-10-31 LAB — T3, FREE: T3, Free: 3.2 pg/mL (ref 2.3–4.2)

## 2016-10-31 LAB — HEMOGLOBIN A1C: Hgb A1c MFr Bld: 6.1 % (ref 4.6–6.5)

## 2016-10-31 NOTE — Progress Notes (Signed)
Pre visit review using our clinic review tool, if applicable. No additional management support is needed unless otherwise documented below in the visit note. 

## 2016-10-31 NOTE — Patient Instructions (Signed)
Preventive Care 65 Years and Older, Female Preventive care refers to lifestyle choices and visits with your health care provider that can promote health and wellness. What does preventive care include?  A yearly physical exam. This is also called an annual well check.  Dental exams once or twice a year.  Routine eye exams. Ask your health care provider how often you should have your eyes checked.  Personal lifestyle choices, including:  Daily care of your teeth and gums.  Regular physical activity.  Eating a healthy diet.  Avoiding tobacco and drug use.  Limiting alcohol use.  Practicing safe sex.  Taking low-dose aspirin every day.  Taking vitamin and mineral supplements as recommended by your health care provider. What happens during an annual well check? The services and screenings done by your health care provider during your annual well check will depend on your age, overall health, lifestyle risk factors, and family history of disease. Counseling  Your health care provider may ask you questions about your:  Alcohol use.  Tobacco use.  Drug use.  Emotional well-being.  Home and relationship well-being.  Sexual activity.  Eating habits.  History of falls.  Memory and ability to understand (cognition).  Work and work environment.  Reproductive health. Screening  You may have the following tests or measurements:  Height, weight, and BMI.  Blood pressure.  Lipid and cholesterol levels. These may be checked every 5 years, or more frequently if you are over 50 years old.  Skin check.  Lung cancer screening. You may have this screening every year starting at age 55 if you have a 30-pack-year history of smoking and currently smoke or have quit within the past 15 years.  Fecal occult blood test (FOBT) of the stool. You may have this test every year starting at age 50.  Flexible sigmoidoscopy or colonoscopy. You may have a sigmoidoscopy every 5 years or  a colonoscopy every 10 years starting at age 50.  Hepatitis C blood test.  Hepatitis B blood test.  Sexually transmitted disease (STD) testing.  Diabetes screening. This is done by checking your blood sugar (glucose) after you have not eaten for a while (fasting). You may have this done every 1-3 years.  Bone density scan. This is done to screen for osteoporosis. You may have this done starting at age 65.  Mammogram. This may be done every 1-2 years. Talk to your health care provider about how often you should have regular mammograms. Talk with your health care provider about your test results, treatment options, and if necessary, the need for more tests. Vaccines  Your health care provider may recommend certain vaccines, such as:  Influenza vaccine. This is recommended every year.  Tetanus, diphtheria, and acellular pertussis (Tdap, Td) vaccine. You may need a Td booster every 10 years.  Varicella vaccine. You may need this if you have not been vaccinated.  Zoster vaccine. You may need this after age 60.  Measles, mumps, and rubella (MMR) vaccine. You may need at least one dose of MMR if you were born in 1957 or later. You may also need a second dose.  Pneumococcal 13-valent conjugate (PCV13) vaccine. One dose is recommended after age 65.  Pneumococcal polysaccharide (PPSV23) vaccine. One dose is recommended after age 65.  Meningococcal vaccine. You may need this if you have certain conditions.  Hepatitis A vaccine. You may need this if you have certain conditions or if you travel or work in places where you may be exposed to   hepatitis A.  Hepatitis B vaccine. You may need this if you have certain conditions or if you travel or work in places where you may be exposed to hepatitis B.  Haemophilus influenzae type b (Hib) vaccine. You may need this if you have certain conditions. Talk to your health care provider about which screenings and vaccines you need and how often you need  them. This information is not intended to replace advice given to you by your health care provider. Make sure you discuss any questions you have with your health care provider. Document Released: 10/29/2015 Document Revised: 06/21/2016 Document Reviewed: 08/03/2015 Elsevier Interactive Patient Education  2017 Elsevier Inc.  

## 2016-10-31 NOTE — Progress Notes (Signed)
Subjective:     [name] [name] is a 66 y.o. female and is here for a comprehensive physical exam. The patient reports problems - hot flashes with eating.  Social History   Social History  . Marital status: Married    Spouse name: N/A  . Number of children: N/A  . Years of education: N/A   Occupational History  . Not on file.   Social History Main Topics  . Smoking status: Former Research scientist (life sciences)  . Smokeless tobacco: Never Used  . Alcohol use No  . Drug use: No  . Sexual activity: No   Other Topics Concern  . Not on file   Social History Narrative  . No narrative on file   Health Maintenance  Topic Date Due  . FOOT EXAM  01/07/1961  . OPHTHALMOLOGY EXAM  01/07/1961  . COLONOSCOPY  09/23/2013  . PAP SMEAR  10/24/2014  . URINE MICROALBUMIN  04/27/2016  . DEXA SCAN  01/12/2017 (Originally 01/08/2016)  . PNA vac Low Risk Adult (1 of 2 - PCV13) 01/12/2017 (Originally 01/08/2016)  . ZOSTAVAX  04/28/2017 (Originally 01/08/2011)  . TETANUS/TDAP  04/28/2017 (Originally 01/07/1970)  . HIV Screening  10/31/2026 (Originally 01/07/1966)  . HEMOGLOBIN A1C  02/08/2017  . MAMMOGRAM  03/29/2017  . INFLUENZA VACCINE  Completed  . Hepatitis C Screening  Completed    The following portions of the patient's history were reviewed and updated as appropriate: She  has a past medical history of Achalasia; Anxiety and depression; Diabetes type 2, controlled ([place]) (04/28/2015); Diverticulosis; Dysphagia; GERD (gastroesophageal reflux disease); Hepatosplenomegaly; Hyperlipidemia; and Status post dilation of esophageal narrowing. She  does not have any pertinent problems on file. She  has a past surgical history that includes Laparoscopic myotomy; Esophagogastroduodenoscopy; and Colonoscopy. Her family history includes Heart disease in her father; Hypertension in her mother. She  reports that she has quit smoking. She has never used smokeless tobacco. She reports that she does not drink alcohol or use drugs. She  has a current medication list which includes the following prescription(s): acetazolamide, fenofibrate, fluoxetine, and milk thistle. Current Outpatient Prescriptions on File Prior to Visit  Medication Sig Dispense Refill  . acetaZOLAMIDE (DIAMOX) 125 MG tablet Take 1 tablet (125 mg total) by mouth 2 (two) times daily. (Patient taking differently: Take 125 mg by mouth 2 (two) times daily as needed. ) 8 tablet 0  . fenofibrate 160 MG tablet Take 1 tablet (160 mg total) by mouth daily. 30 tablet 1  . FLUoxetine (PROZAC) 20 MG/5ML solution Take 10 mLs (40 mg total) by mouth daily. 900 mL 1  . milk thistle 175 MG tablet Take 175 mg by mouth daily.     No current facility-administered medications on file prior to visit.    She has No Known Allergies..  Review of Systems Review of Systems  Constitutional: Negative for activity change, appetite change and fatigue.  HENT: Negative for hearing loss, congestion, tinnitus and ear discharge.  dentist q55m Eyes: Negative for visual disturbance (see optho q1y -- vision corrected to 20/20 with glasses).  Respiratory: Negative for cough, chest tightness and shortness of breath.   Cardiovascular: Negative for chest pain, palpitations and leg swelling.  Gastrointestinal: Negative for abdominal pain, diarrhea, constipation and abdominal distention.  Genitourinary: Negative for urgency, frequency, decreased urine volume and difficulty urinating.  Musculoskeletal: Negative for back pain, arthralgias and gait problem.  Skin: Negative for color change, pallor and rash.  Neurological: Negative for dizziness, light-headedness, numbness and headaches.  Hematological: Negative  for adenopathy. Does not bruise/bleed easily.  Psychiatric/Behavioral: Negative for suicidal ideas, confusion, sleep disturbance, self-injury, dysphoric mood, decreased concentration and agitation.       Objective:    BP 126/70 (BP Location: Left Arm, Cuff Size: Normal)   Pulse 98    Temp 98.2 F (36.8 C) (Oral)   Resp 16   Ht 5' 0.1" (1.527 m)   Wt 141 lb 12.8 oz (64.3 kg)   SpO2 97%   BMI 27.60 kg/m  General appearance: alert, cooperative, appears stated age and no distress Head: Normocephalic, without obvious abnormality, atraumatic Eyes: conjunctivae/corneas clear. PERRL, EOM's intact. Fundi benign. Ears: normal TM's and external ear canals both ears Nose: Nares normal. Septum midline. Mucosa normal. No drainage or sinus tenderness. Throat: lips, mucosa, and tongue normal; teeth and gums normal Neck: no adenopathy, no carotid bruit, no JVD, supple, symmetrical, trachea midline and thyroid not enlarged, symmetric, no tenderness/mass/nodules Back: symmetric, no curvature. ROM normal. No CVA tenderness. Lungs: clear to auscultation bilaterally Breasts: normal appearance, no masses or tenderness Heart: regular rate and rhythm, S1, S2 normal, no murmur, click, rub or gallop Abdomen: soft, non-tender; bowel sounds normal; no masses,  no organomegaly Pelvic: cervix normal in appearance, external genitalia normal, no adnexal masses or tenderness, no bladder tenderness, no cervical motion tenderness, uterus normal size, shape, and consistency and vagina normal without discharge Extremities: extremities normal, atraumatic, no cyanosis or edema Pulses: 2+ and symmetric   Skin: Skin color, texture, turgor normal. No rashes or lesions Lymph nodes: Cervical, supraclavicular, and axillary nodes normal. Neurologic: Alert and oriented X 3, normal strength and tone. Normal symmetric reflexes. Normal coordination and gait   pap done Assessment:    Healthy female exam.      Plan:    ghm utd Check labs  See After Visit Summary for Counseling Recommendations    1. Preventative health care  - Cytology - PAP  2. Hyperlipidemia, unspecified hyperlipidemia type Check labs - Comprehensive metabolic panel - CBC - Lipid panel  3. Diet-controlled diabetes mellitus  (HCC) Check labs  - Hemoglobin A1c - Comprehensive metabolic panel - CBC - POCT Urinalysis Dipstick (Automated) - Microalbumin, urine  4. Diaphoresis   - Comprehensive metabolic panel - CBC - TSH - T4, free - T3, free

## 2016-11-01 LAB — MICROALBUMIN, URINE: Microalb, Ur: 2.2 mg/dL

## 2016-11-02 LAB — CYTOLOGY - PAP
Diagnosis: NEGATIVE
HPV (WINDOPATH): NOT DETECTED

## 2016-11-08 ENCOUNTER — Ambulatory Visit: Payer: BLUE CROSS/BLUE SHIELD | Admitting: Medical

## 2017-01-19 ENCOUNTER — Encounter: Payer: Self-pay | Admitting: Family Medicine

## 2017-01-19 ENCOUNTER — Ambulatory Visit (INDEPENDENT_AMBULATORY_CARE_PROVIDER_SITE_OTHER): Payer: BLUE CROSS/BLUE SHIELD | Admitting: Family Medicine

## 2017-01-19 ENCOUNTER — Ambulatory Visit (HOSPITAL_BASED_OUTPATIENT_CLINIC_OR_DEPARTMENT_OTHER)
Admission: RE | Admit: 2017-01-19 | Discharge: 2017-01-19 | Disposition: A | Discharge status: Home / Self Care | Payer: BLUE CROSS/BLUE SHIELD | Source: Ambulatory Visit | Attending: Family Medicine | Admitting: Family Medicine

## 2017-01-19 VITALS — BP 106/60 | HR 82 | Temp 97.8°F | Ht 60.1 in | Wt 142.6 lb

## 2017-01-19 DIAGNOSIS — Z8719 Personal history of other diseases of the digestive system: Secondary | ICD-10-CM | POA: Diagnosis not present

## 2017-01-19 DIAGNOSIS — R1011 Right upper quadrant pain: Secondary | ICD-10-CM | POA: Insufficient documentation

## 2017-01-19 DIAGNOSIS — K5900 Constipation, unspecified: Secondary | ICD-10-CM | POA: Diagnosis present

## 2017-01-19 NOTE — Progress Notes (Signed)
Chief Complaint  Patient presents with  . Flank Pain    (R) side-x 10 days- a little nauseated    Subjective: Patient is a 66 y.o. female here for R upper abd pain.  Constant and dull achy pain for 10 days. Laying down makes it better. Eating makes it worse. She has been having some constipation. BM's make it better also. No bleeding, weight loss or nighttime awakenings. No urinary complaints or fevers.   ROS: GI: As noted in HPI   Family History  Problem Relation Age of Onset  . Hypertension Mother   . Heart disease Father   . Colon cancer Neg Hx   . Colon polyps Neg Hx   . Diabetes Neg Hx   . Kidney disease Neg Hx    Past Medical History:  Diagnosis Date  . Achalasia   . Anxiety and depression   . Diabetes type 2, controlled ([place]) 04/28/2015  . Diverticulosis   . Dysphagia   . GERD (gastroesophageal reflux disease)   . Hepatosplenomegaly   . Hyperlipidemia   . Status post dilation of esophageal narrowing    No Known Allergies  Current Outpatient Prescriptions:  .  FLUoxetine (PROZAC) 20 MG/5ML solution, Take 10 mLs (40 mg total) by mouth daily., Disp: 900 mL, Rfl: 1 .  milk thistle 175 MG tablet, Take 175 mg by mouth daily., Disp: , Rfl:  .  acetaZOLAMIDE (DIAMOX) 125 MG tablet, Take 1 tablet (125 mg total) by mouth 2 (two) times daily. (Patient taking differently: Take 125 mg by mouth 2 (two) times daily as needed. ), Disp: 8 tablet, Rfl: 0  Objective: BP 106/60 (BP Location: Left Arm, Patient Position: Sitting, Cuff Size: Normal)   Pulse 82   Temp 97.8 F (36.6 C) (Oral)   Ht 5' 0.1" (1.527 m)   Wt 142 lb 9.6 oz (64.7 kg)   SpO2 98%   BMI 27.76 kg/m  General: Awake, appears stated age Heart: RRR Lungs: CTAB, No accessory muscle use Abd: BS+, soft, mildly distended, TTP in RUQ, neg Murphy's, McBurney's, Carnett's MSK: No CVA tenderness b/l Psych: Age appropriate judgment and insight, normal affect and mood  Assessment and Plan: History of constipation -  Plan: DG Abd 2 Views  Right upper quadrant abdominal pain - Plan: DG Abd 2 Views  Orders as above. Based off of hx, likely related to gas, possibly gall bladder. XR to r/o stone (likely not urate stone) and gas/constipation. If neg, will order RUQ [place]. Pt declined AVS. F/u prn. The patient voiced understanding and agreement to the plan.  [place], DO 01/19/17  2:20 PM

## 2017-01-19 NOTE — Patient Instructions (Signed)
If your XR is normal, we will be ordering an ultrasound of your gall bladder. You will not get a call about your XR today. If the X-ray shows anything else, someone will reach out by the end of the business day.  Drink lots of fluids.  If you start having bleeding, nausea, vomiting, or fevers, let us know or seek care.

## 2017-01-19 NOTE — Progress Notes (Signed)
Pre visit review using our clinic review tool, if applicable. No additional management support is needed unless otherwise documented below in the visit note. 

## 2017-03-13 ENCOUNTER — Ambulatory Visit: Payer: BLUE CROSS/BLUE SHIELD | Admitting: Nurse Practitioner

## 2017-03-15 ENCOUNTER — Ambulatory Visit: Payer: BLUE CROSS/BLUE SHIELD | Admitting: Nurse Practitioner

## 2017-03-20 ENCOUNTER — Encounter: Payer: Self-pay | Admitting: Nurse Practitioner

## 2017-03-20 ENCOUNTER — Ambulatory Visit (INDEPENDENT_AMBULATORY_CARE_PROVIDER_SITE_OTHER): Payer: BLUE CROSS/BLUE SHIELD | Admitting: Nurse Practitioner

## 2017-03-20 ENCOUNTER — Other Ambulatory Visit (INDEPENDENT_AMBULATORY_CARE_PROVIDER_SITE_OTHER): Payer: BLUE CROSS/BLUE SHIELD

## 2017-03-20 VITALS — BP 112/60 | HR 56 | Ht 60.0 in | Wt 140.0 lb

## 2017-03-20 DIAGNOSIS — K220 Achalasia of cardia: Secondary | ICD-10-CM | POA: Diagnosis not present

## 2017-03-20 DIAGNOSIS — R12 Heartburn: Secondary | ICD-10-CM | POA: Diagnosis not present

## 2017-03-20 DIAGNOSIS — K760 Fatty (change of) liver, not elsewhere classified: Secondary | ICD-10-CM

## 2017-03-20 LAB — HEPATIC FUNCTION PANEL
ALT: 29 U/L (ref 0–35)
AST: 25 U/L (ref 0–37)
Albumin: 4.4 g/dL (ref 3.5–5.2)
Alkaline Phosphatase: 80 U/L (ref 39–117)
BILIRUBIN TOTAL: 0.5 mg/dL (ref 0.2–1.2)
Bilirubin, Direct: 0.1 mg/dL (ref 0.0–0.3)
Total Protein: 7.5 g/dL (ref 6.0–8.3)

## 2017-03-20 NOTE — Progress Notes (Addendum)
HPI: Patient is a 66 year old female previously followed by Dr. Deatra Ina, now Dr. Carlean Purl. She has a history of fatty liver disease, achalasia status post laparoscopic Heller myotomy and chronic intermittent dyspepsia. Patient was last seen a year ago, at that time she reported rare intermittent dysphagia. Colonoscopy was discussed as her last one was in 2004. She wanted to avoid any procedures. Fecal occult blood test in June of last year was negative.  Patient comes in after an episode of terrible burning in her chest 3 weeks ago associated with an episode of dysphagia. The burning lasted for 4 days. She took apple cider for the discomfort. No heartburn since. She continues to have intermittent dysphagia especially if daily to quickly or too much. Certain foods are more prone to get stuck in her esophagus such as broccoli, steak and bread. Overall episodes of dysphagia occur about once a month. She does not feel there is been any progression of the dysphagia but at her last visit the episodes were described as being only rare. Patient is very worried about developing esophageal cancer. Her weight is been stable.   Past Medical History:  Diagnosis Date  . Achalasia   . Anxiety and depression   . Diabetes type 2, controlled ([place]) 04/28/2015  . Diverticulosis   . Dysphagia   . GERD (gastroesophageal reflux disease)   . Hepatosplenomegaly   . Hyperlipidemia   . Status post dilation of esophageal narrowing     Patient's surgical history, family medical history, social history, medications and allergies were all reviewed in Epic    Physical Exam: BP 112/60 (BP Location: Right Arm, Patient Position: Sitting, Cuff Size: Normal)   Pulse (!) 56   Ht 5' (1.524 m)   Wt 140 lb (63.5 kg)   BMI 27.34 kg/m   GENERAL: Well developed white female in NAD PSYCH: :Pleasant, cooperative, normal affect EENT:  conjunctiva pink, mucous membranes moist, neck supple without masses CARDIAC:  RRR, no  peripheral edema PULM: Normal respiratory effort, lungs CTA bilaterally, no wheezing ABDOMEN:  soft, nontender, nondistended, no obvious masses,  normal bowel sounds SKIN:  turgor, no lesions seen Musculoskeletal:  Normal muscle tone, normal strength NEURO: Alert and oriented x 3, no focal neurologic deficits   ASSESSMENT and PLAN:  1. Pleasant 66 year old female with history of achalasia, status post remote Heller myotomy.  At time of last visit her episodes of dysphagia were "rare". Though patient doesn't think dysphagia is any worse she also characterizes them as occurring about once a month. A few weeks ago she had an episode of dysphagia which was associated with 4 days of significant burning in her chest . She has a lot of anxiety about esophageal cancer.  In the past she wanted to avoid procedures but she is now open to an EGD which is reasonable given what seems to be some progression of her dysphagia.  -The risks and benefits of EGD were discussed and the patient agrees to proceed.  -Advised patient to eat small bites, chew well with liquids in between bites to avoid food impaction.   2. Colon cancer screening. Its been over 10 years since her last colonoscopy. Patient is open to doing a colonoscopy but does not want to do the EGD and colonoscopy at same time. She wants to proceed with an upper endoscopy first and then talk about a colonoscopy. Her last colonoscopy was in 2004 and it was normal. Cologuard may be an option for her.  3. Fatty liver disease. She was able to get triglycerides down from 800s to 258 with medication. For unclear reasons patient stopped the medication. She plans on restarting it since hypertriglyceridemia can contribute to steatosis.  Tye Savoy , NP 03/20/2017, 9:42 AM   Agree with Ms. Destynee Stringfellow's assessment and plan. Gatha Mayer, MD, [facility]

## 2017-03-20 NOTE — Patient Instructions (Signed)
If you are age 66 or older, your body mass index should be between 23-30. Your Body mass index is 27.34 kg/m. If this is out of the aforementioned range listed, please consider follow up with your Primary Care Provider.  If you are age 88 or younger, your body mass index should be between 19-25. Your Body mass index is 27.34 kg/m. If this is out of the aformentioned range listed, please consider follow up with your Primary Care Provider.   You have been scheduled for an endoscopy. Please follow written instructions given to you at your visit today. If you use inhalers (even only as needed), please bring them with you on the day of your procedure. Your physician has requested that you go to www.startemmi.com and enter the access code given to you at your visit today. This web site gives a general overview about your procedure. However, you should still follow specific instructions given to you by our office regarding your preparation for the procedure.   Your physician has requested that you go to the basement for the following lab work before leaving today: Hepatic Panel  Thank you for choosing me and [facility].   Tye Savoy, NP

## 2017-04-10 ENCOUNTER — Encounter: Payer: Self-pay | Admitting: Internal Medicine

## 2017-04-11 ENCOUNTER — Encounter: Payer: BLUE CROSS/BLUE SHIELD | Admitting: Internal Medicine

## 2017-04-24 ENCOUNTER — Encounter: Payer: Self-pay | Admitting: Internal Medicine

## 2017-04-24 ENCOUNTER — Ambulatory Visit (AMBULATORY_SURGERY_CENTER): Payer: BLUE CROSS/BLUE SHIELD | Admitting: Internal Medicine

## 2017-04-24 VITALS — BP 175/77 | HR 75 | Temp 99.6°F | Resp 19 | Ht 60.0 in | Wt 140.0 lb

## 2017-04-24 DIAGNOSIS — K222 Esophageal obstruction: Secondary | ICD-10-CM | POA: Diagnosis not present

## 2017-04-24 DIAGNOSIS — K220 Achalasia of cardia: Secondary | ICD-10-CM

## 2017-04-24 MED ORDER — SODIUM CHLORIDE 0.9 % IV SOLN: 500.0000 mL | INTRAVENOUS | Status: DC

## 2017-04-24 NOTE — Op Note (Signed)
[facility] Patient Name: [name] [name] Procedure Date: 04/24/2017 11:00 AM MRN: [id number] Endoscopist: Gatha Mayer , MD Age: 66 Referring MD:  Date of Birth: 02-09-1951 Gender: Female Account #: [id number] Procedure:                Upper GI endoscopy Indications:              Dysphagia Medicines:                Propofol per Anesthesia, Monitored Anesthesia Care Procedure:                Pre-Anesthesia Assessment:                           - Prior to the procedure, a History and Physical                            was performed, and patient medications and                            allergies were reviewed. The patient's tolerance of                            previous anesthesia was also reviewed. The risks                            and benefits of the procedure and the sedation                            options and risks were discussed with the patient.                            All questions were answered, and informed consent                            was obtained. Prior Anticoagulants: The patient has                            taken no previous anticoagulant or antiplatelet                            agents. ASA Grade Assessment: II - A patient with                            mild systemic disease. After reviewing the risks                            and benefits, the patient was deemed in                            satisfactory condition to undergo the procedure.                           After obtaining informed consent, the endoscope was  passed under direct vision. Throughout the                            procedure, the patient's blood pressure, pulse, and                            oxygen saturations were monitored continuously. The                            Endoscope was introduced through the mouth, and                            advanced to the second part of duodenum. The upper                            GI endoscopy was  accomplished without difficulty.                            The patient tolerated the procedure well. Scope In: Scope Out: Findings:                 The examined esophagus was significantly tortuous.                           One mild benign-appearing, intrinsic stenosis was                            found at the gastroesophageal junction. And was                            traversed. A TTS dilator was passed through the                            scope. Dilation with an 18-19-20 mm balloon dilator                            was performed to 20 mm. The dilation site was                            examined and showed mild improvement in luminal                            narrowing. Estimated blood loss was minimal.                           Evidence of a partial fundoplication was found in                            the cardia. The wrap appeared loose. This was                            traversed.                           The  exam was otherwise without abnormality. Complications:            No immediate complications. Estimated Blood Loss:     Estimated blood loss was minimal. Impression:               - Tortuous esophagus.                           - Benign-appearing esophageal stenosis. Dilated.                           - A partial fundoplication was found. The wrap                            appears loose.                           - The examination was otherwise normal.                           - No specimens collected. Recommendation:           - Patient has a contact number available for                            emergencies. The signs and symptoms of potential                            delayed complications were discussed with the                            patient. Return to normal activities tomorrow.                            Written discharge instructions were provided to the                            patient.                           - Resume previous diet.                            - Continue present medications.                           - she should call back in a few weeks with a                            symptom update Gatha Mayer, MD 04/24/2017 11:28:37 AM This report has been signed electronically.

## 2017-04-24 NOTE — Progress Notes (Signed)
Called to room to assist during endoscopic procedure.  Patient ID and intended procedure confirmed with present staff. Received instructions for my participation in the procedure from the performing physician.  

## 2017-04-24 NOTE — Progress Notes (Signed)
Alert and oriented x3, pleased with MAC, report to RN Megan 

## 2017-04-24 NOTE — Progress Notes (Signed)
Pt's states no medical or surgical changes since previsit or office visit. 

## 2017-04-24 NOTE — Progress Notes (Signed)
Dental advisory given to patient 

## 2017-04-24 NOTE — Patient Instructions (Addendum)
    No major problems seen. I dilated the gastroesophageal junction - let's see if that helps.  No signs of cancer.  Call me back in a few weeks to see if it has helped.  I appreciate the opportunity to care for you. Gatha Mayer, MD, [facility]   **Handouts given on Esophagitis and post dilation diet**  YOU HAD AN ENDOSCOPIC PROCEDURE TODAY: Refer to the procedure report and other information in the discharge instructions given to you for any specific questions about what was found during the examination. If this information does not answer your questions, please call [place] office at [phone] to clarify.   YOU SHOULD EXPECT: Some feelings of bloating in the abdomen. Passage of more gas than usual. Walking can help get rid of the air that was put into your GI tract during the procedure and reduce the bloating. If you had a lower endoscopy (such as a colonoscopy or flexible sigmoidoscopy) you may notice spotting of blood in your stool or on the toilet paper. Some abdominal soreness may be present for a day or two, also.  DIET: Your first meal following the procedure should be a light meal and then it is ok to progress to your normal diet. A half-sandwich or bowl of soup is an example of a good first meal. Heavy or fried foods are harder to digest and may make you feel nauseous or bloated. Drink plenty of fluids but you should avoid alcoholic beverages for 24 hours. If you had a esophageal dilation, please see attached instructions for diet.    ACTIVITY: Your care partner should take you home directly after the procedure. You should plan to take it easy, moving slowly for the rest of the day. You can resume normal activity the day after the procedure however YOU SHOULD NOT DRIVE, use power tools, machinery or perform tasks that involve climbing or major physical exertion for 24 hours (because of the sedation medicines used during the test).   SYMPTOMS TO REPORT IMMEDIATELY: A  gastroenterologist can be reached at any hour. Please call [phone]  for any of the following symptoms:   Following upper endoscopy (EGD, EUS, ERCP, esophageal dilation) Vomiting of blood or coffee ground material  New, significant abdominal pain  New, significant chest pain or pain under the shoulder blades  Painful or persistently difficult swallowing  New shortness of breath  Black, tarry-looking or red, bloody stools  FOLLOW UP:  If any biopsies were taken you will be contacted by phone or by letter within the next 1-3 weeks. Call [phone]  if you have not heard about the biopsies in 3 weeks.  Please also call with any specific questions about appointments or follow up tests.

## 2017-04-25 ENCOUNTER — Telehealth: Payer: Self-pay | Admitting: *Deleted

## 2017-04-25 NOTE — Telephone Encounter (Signed)
No answer, left message to call if questions or concerns. 

## 2017-05-17 ENCOUNTER — Telehealth: Payer: Self-pay | Admitting: Family Medicine

## 2017-05-17 DIAGNOSIS — F32A Depression, unspecified: Secondary | ICD-10-CM

## 2017-05-17 DIAGNOSIS — F329 Major depressive disorder, single episode, unspecified: Secondary | ICD-10-CM

## 2017-05-17 MED ORDER — FLUOXETINE HCL 20 MG/5ML PO SOLN: 40.0000 mg | Freq: Every day | ORAL | 0 refills | Status: DC

## 2017-05-17 NOTE — Telephone Encounter (Signed)
[facility]

## 2017-05-17 NOTE — Telephone Encounter (Signed)
Self.   Refill for PROZAC - 90 days supply   Pharmacy: [place], [place]

## 2017-08-02 ENCOUNTER — Other Ambulatory Visit: Payer: Self-pay | Admitting: Family Medicine

## 2017-08-02 DIAGNOSIS — F32A Depression, unspecified: Secondary | ICD-10-CM

## 2017-08-02 DIAGNOSIS — M65332 Trigger finger, left middle finger: Secondary | ICD-10-CM | POA: Diagnosis not present

## 2017-08-02 DIAGNOSIS — F329 Major depressive disorder, single episode, unspecified: Secondary | ICD-10-CM

## 2017-11-26 ENCOUNTER — Ambulatory Visit: Payer: BLUE CROSS/BLUE SHIELD | Admitting: Physician Assistant

## 2017-11-26 ENCOUNTER — Encounter: Payer: Self-pay | Admitting: Physician Assistant

## 2017-11-26 VITALS — BP 110/64 | HR 78 | Ht 60.0 in | Wt 145.0 lb

## 2017-11-26 DIAGNOSIS — K219 Gastro-esophageal reflux disease without esophagitis: Secondary | ICD-10-CM

## 2017-11-26 DIAGNOSIS — K220 Achalasia of cardia: Secondary | ICD-10-CM | POA: Diagnosis not present

## 2017-11-26 DIAGNOSIS — Z1211 Encounter for screening for malignant neoplasm of colon: Secondary | ICD-10-CM | POA: Diagnosis not present

## 2017-11-26 DIAGNOSIS — R110 Nausea: Secondary | ICD-10-CM | POA: Diagnosis not present

## 2017-11-26 NOTE — Progress Notes (Addendum)
Subjective:    Patient ID: [name] [name], female    DOB: [birth year], 67 y.o.   MRN: [id number]  HPI  [name] [name] is a pleasant 67 year old female, known to Dr. Carlean Purl who comes in today with complaints of recent nausea. She was last seen in June 2018 and at that time had complaints of dysphagia. She has history of achalasia and is status post Heller myotomy in 2005. She has had intermittent complaints of dyspepsia. Last colonoscopy was done in 2004 and was normal. She has been reluctant to have follow-up colonoscopy. She has no current lower GI complaints. Family history is negative for colon cancer polyps far she is aware. She underwent upper endoscopy in July 2018 per Dr. Carlean Purl which showed a tortuous esophagus, she was balloon dilated from 18-20 mm and it was noted that her previous wrap was somewhat loose. She has not been on chronic PPI therapy. She says about 3 weeks ago she just started noticing some nausea or queasiness. She put herself on ranitidine twice daily and feels better. She did not have any associated fever, had some loose stools but no diarrhea. She denies any dysphagia or odynophagia. She has stopped eating citrus and green peppers and has been carefully watching her diet which she thinks is helping. She has no current complaints of abdominal pain and appetite has been good. No regular aspirin or NSAID use.   Review of Systems Pertinent positive and negative review of systems were noted in the above HPI section.  All other review of systems was otherwise negative.  Outpatient Encounter Medications as of 11/26/2017  Medication Sig  . ranitidine (ZANTAC) 150 MG tablet Take 150 mg by mouth 2 (two) times daily.   No facility-administered encounter medications on file as of 11/26/2017.    No Known Allergies Patient Active Problem List   Diagnosis Date Noted  . Depression, major, single episode, moderate ([place]) 12/06/2017  . Diabetes type 2, controlled ([place]) 04/28/2015  . Mucocele of  lower lip 01/13/2015  . Hepatosplenomegaly 01/13/2015  . Subacute appendicitis 01/13/2015  . RLQ abdominal pain 12/01/2014  . Gastroenteritis 11/20/2014  . Knee pain, left 05/26/2014  . Night sweats 12/10/2013  . Lateral epicondylitis (tennis elbow) 04/24/2013  . Tongue mass 02/12/2013  . RUQ pain 12/09/2012  . Chest pain 11/10/2012  . Hyperlipidemia   . Epigastric abdominal pain 08/14/2012  . Anxiety and depression 01/10/2012  . Achalasia 10/24/2011  . Vitamin D deficiency 10/19/2011  . POLYURIA 12/21/2010  . HYPERLIPIDEMIA 12/13/2007  . GERD 03/07/2007  . DIVERTICULOSIS, COLON 03/07/2007  . Colon cancer screening 03/07/2007  . LAPAROSCOPY, HX OF 03/07/2007   Social History   Socioeconomic History  . Marital status: Married    Spouse name: Not on file  . Number of children: Not on file  . Years of education: Not on file  . Highest education level: Not on file  Social Needs  . Financial resource strain: Not on file  . Food insecurity - worry: Not on file  . Food insecurity - inability: Not on file  . Transportation needs - medical: Not on file  . Transportation needs - non-medical: Not on file  Occupational History  . Not on file  Tobacco Use  . Smoking status: Former Smoker    Packs/day: 0.20    Years: 5.00    Pack years: 1.00    Types: Cigarettes    Last attempt to quit: 11/01/1979    Years since quitting: 38.1  . Smokeless tobacco: Never  Used  Substance and Sexual Activity  . Alcohol use: No  . Drug use: No  . Sexual activity: No  Other Topics Concern  . Not on file  Social History Narrative  . Not on file    Ms. Barrows's family history includes Heart disease in her father; Hypertension in her mother.      Objective:    Vitals:   11/26/17 1009  BP: 110/64  Pulse: 78    Physical Exam ; well-developed older white female in no acute distress, very pleasant anxious height 5 foot weight 145 BMI 28.3. HEENT; nontraumatic normocephalic EOMI PERRLA  sclera anicteric, Cardiovascular; regular rate and rhythm with S1-S2 no murmur rub or gallop, Pulmonary ;clear bilaterally Abdomen ;soft, nontender nondistended bowel sounds are active no palpable mass or hepatosplenomegaly, Extremities ;no clubbing cyanosis or edema skin warm and dry, Neuropsych ;mood and affect appropriate       Assessment & Plan:   #88 67 year old white female with history of achalasia status post remote Heller myotomy 2005 with evidence of loosening at time of last EGD summer 2018. #2 recurrent dysphagia-finding of tortuous esophagus which was balloon dilated July 2018 with good response No current complaints of dysphagia  #3 recent nausea/queasiness-resolved on ranitidine. Suspect she may have some mild acid reflux secondary to loosening of her previous Heller myotomy and wrap #4 colon cancer surveillance-normal colonoscopy 2004 patient does not want colonoscopy but is agreeable to Cologuard #5 fatty liver  Plan Strict antireflux regimen was reviewed and patient was given an antireflux diet Continue ranitidine 150 mg by mouth twice daily ColoGuard stool DNA test to be pursued She will follow up with Dr. Carlean Purl in 4-6 months or sooner if needed.   Malikah Lakey S Anacristina Steffek PA-C 12/10/2017   Cc: Ann Held, *

## 2017-11-26 NOTE — Patient Instructions (Signed)
Continue Zantac 150 mg, take 1 tab twice daily. Anti-reflux diet provided.   If you are age 67 or older, your body mass index should be between 23-30. Your Body mass index is 28.32 kg/m. If this is out of the aforementioned range listed, please consider follow up with your Primary Care Provider.  Your provider has ordered Cologuard testing as an option for colon cancer screening. This is performed by [employer] and may be out of network with your insurance. PRIOR to completing the test, it is YOUR responsibility to contact your insurance about covered benefits for this test. Your out of pocket expense could be anywhere from $0.00 to $649.00.   When you call to check coverage with your insurer, please provide the following information:   -The ONLY provider of Cologuard is [place] code for Cologuard is [phone].  [employer] NPI # [id number]  -[employer] Tax ID # [id number]   We have already sent your demographic and insurance information to [employer] (phone number [phone]) and they should contact you within the next week regarding your test. If you have not heard from them within the next week, please call our office at [phone].

## 2017-12-04 ENCOUNTER — Other Ambulatory Visit: Payer: Self-pay | Admitting: Family Medicine

## 2017-12-04 DIAGNOSIS — F32A Depression, unspecified: Secondary | ICD-10-CM

## 2017-12-04 DIAGNOSIS — F329 Major depressive disorder, single episode, unspecified: Secondary | ICD-10-CM

## 2017-12-06 ENCOUNTER — Ambulatory Visit: Payer: BLUE CROSS/BLUE SHIELD | Admitting: Family Medicine

## 2017-12-06 ENCOUNTER — Encounter: Payer: Self-pay | Admitting: Family Medicine

## 2017-12-06 VITALS — BP 130/80 | HR 86 | Temp 97.9°F | Resp 16 | Ht 60.0 in | Wt 145.2 lb

## 2017-12-06 DIAGNOSIS — E785 Hyperlipidemia, unspecified: Secondary | ICD-10-CM

## 2017-12-06 DIAGNOSIS — K760 Fatty (change of) liver, not elsewhere classified: Secondary | ICD-10-CM | POA: Diagnosis not present

## 2017-12-06 DIAGNOSIS — F321 Major depressive disorder, single episode, moderate: Secondary | ICD-10-CM | POA: Diagnosis not present

## 2017-12-06 LAB — LIPID PANEL
CHOL/HDL RATIO: 5
CHOLESTEROL: 184 mg/dL (ref 0–200)
HDL: 36 mg/dL — ABNORMAL LOW (ref >39.00)
NonHDL: 148.3
TRIGLYCERIDES: 301 mg/dL — AB (ref 0.0–149.0)
VLDL: 60.2 mg/dL — ABNORMAL HIGH (ref 0.0–40.0)

## 2017-12-06 LAB — COMPREHENSIVE METABOLIC PANEL
ALBUMIN: 4.2 g/dL (ref 3.5–5.2)
ALK PHOS: 68 U/L (ref 39–117)
ALT: 43 U/L — ABNORMAL HIGH (ref 0–35)
AST: 33 U/L (ref 0–37)
BUN: 15 mg/dL (ref 6–23)
CALCIUM: 10.2 mg/dL (ref 8.4–10.5)
CO2: 26 mEq/L (ref 19–32)
Chloride: 102 mEq/L (ref 96–112)
Creatinine, Ser: 0.86 mg/dL (ref 0.40–1.20)
GFR: 69.97 mL/min (ref >60.00)
Glucose, Bld: 113 mg/dL — ABNORMAL HIGH (ref 70–99)
Potassium: 4.3 mEq/L (ref 3.5–5.1)
Sodium: 136 mEq/L (ref 135–145)
TOTAL PROTEIN: 7.3 g/dL (ref 6.0–8.3)
Total Bilirubin: 0.7 mg/dL (ref 0.2–1.2)

## 2017-12-06 LAB — HEMOGLOBIN A1C: Hgb A1c MFr Bld: 6.3 % (ref 4.6–6.5)

## 2017-12-06 LAB — LDL CHOLESTEROL, DIRECT: Direct LDL: 98 mg/dL

## 2017-12-06 MED ORDER — FLUOXETINE HCL 20 MG/5ML PO SOLN: 40.0000 mg | Freq: Every day | ORAL | 1 refills | Status: DC

## 2017-12-06 MED ORDER — BUPROPION HCL ER (XL) 150 MG PO TB24: ORAL_TABLET | ORAL | 3 refills | Status: DC

## 2017-12-06 NOTE — Progress Notes (Deleted)
Patient ID: [name] [name], female    DOB: 08/16/1951  Age: 67 y.o. MRN: [id number]    Subjective:  Subjective  HPI [name] [name] presents for ***  Review of Systems  History Past Medical History:  Diagnosis Date  . Achalasia   . Anxiety and depression   . Diabetes type 2, controlled ([place]) 04/28/2015  . Diverticulosis   . Dysphagia   . GERD (gastroesophageal reflux disease)   . Hepatosplenomegaly   . Hyperlipidemia   . Status post dilation of esophageal narrowing     She has a past surgical history that includes Laparoscopic myotomy; Esophagogastroduodenoscopy; and Colonoscopy.   Her family history includes Heart disease in her father; Hypertension in her mother.She reports that she quit smoking about 38 years ago. Her smoking use included cigarettes. She has a 1.00 pack-year smoking history. she has never used smokeless tobacco. She reports that she does not drink alcohol or use drugs.  Current Outpatient Medications on File Prior to Visit  Medication Sig Dispense Refill  . ranitidine (ZANTAC) 150 MG tablet Take 150 mg by mouth 2 (two) times daily.     Current Facility-Administered Medications on File Prior to Visit  Medication Dose Route Frequency Provider Last Rate Last Dose  . 0.9 %  sodium chloride infusion  500 mL Intravenous Continuous Gatha Mayer, MD         Objective:  Objective  Physical Exam BP 130/80 (BP Location: Left Arm, Cuff Size: Normal)   Pulse 86   Temp 97.9 F (36.6 C) (Oral)   Resp 16   Ht 5' (1.524 m)   Wt 145 lb 3.2 oz (65.9 kg)   SpO2 95%   BMI 28.36 kg/m  Wt Readings from Last 3 Encounters:  12/06/17 145 lb 3.2 oz (65.9 kg)  11/26/17 145 lb (65.8 kg)  04/24/17 140 lb (63.5 kg)     Lab Results  Component Value Date   WBC 8.3 10/31/2016   HGB 15.1 (H) 10/31/2016   HCT 43.3 10/31/2016   PLT 245.0 10/31/2016   GLUCOSE 96 10/31/2016   CHOL 190 10/31/2016   TRIG 258.0 (H) 10/31/2016   HDL 37.30 (L) 10/31/2016   LDLDIRECT 101.0  10/31/2016   LDLCALC 95 12/21/2010   ALT 29 03/20/2017   AST 25 03/20/2017   NA 135 10/31/2016   K 4.0 10/31/2016   CL 102 10/31/2016   CREATININE 0.89 10/31/2016   BUN 15 10/31/2016   CO2 25 10/31/2016   TSH 2.30 10/31/2016   INR 1.0 03/11/2015   HGBA1C 6.1 10/31/2016   MICROALBUR 2.2 10/31/2016    Dg Abd 2 Views  Result Date: 01/19/2017 CLINICAL DATA:  Constipation.  Abdominal pain. EXAM: ABDOMEN - 2 VIEW COMPARISON:  Ultrasound 02/23/2016.  CT 03/01/ 2016.  CT 03/07/2007. FINDINGS: Soft tissue structures are unremarkable. Large stool volume. No bowel distention or free air. Stable pelvic calcification consistent phlebolith. IMPRESSION: 1. Large amount of stool noted diffusely throughout the colon consistent with constipation. No bowel distention or free air. 2. No acute abnormality identified. Electronically Signed   By: Marcello Moores  Register   On: 01/19/2017 15:04     Assessment & Plan:  Plan  I have changed [name] [name]'s FLUoxetine. I am also having her maintain her ranitidine. We will continue to administer sodium chloride.  Meds ordered this encounter  Medications  . FLUoxetine (PROZAC) 20 MG/5ML solution    Sig: Take 10 mLs (40 mg total) by mouth daily.    Dispense:  900 mL  Refill:  1    Problem List Items Addressed This Visit    None    Visit Diagnoses    Depression, major, single episode, moderate ([place])    -  Primary   Relevant Medications   FLUoxetine (PROZAC) 20 MG/5ML solution   Hyperlipidemia LDL goal <100       Relevant Orders   Comprehensive metabolic panel   Hemoglobin A1c   Lipid panel      Follow-up: No Follow-up on file.  Ann Held, DO

## 2017-12-06 NOTE — Progress Notes (Signed)
Patient ID: [name] [name], female   DOB: [birth year], 67 y.o.   MRN: [id number]    Subjective:  I acted as a Education administrator for Dr. Carollee Herter.  Guerry Bruin, [place]   Patient ID: [name] [name], female    DOB: [birth year], 67 y.o.   MRN: [id number]  Chief Complaint  Patient presents with  . Depression     Patient is in today for follow up depression.-- she feel like it is worse and would like to discuss adding something.   Patient Care Team: Carollee Herter, Alferd Apa, DO as PCP - General (Family Medicine)   Past Medical History:  Diagnosis Date  . Achalasia   . Anxiety and depression   . Diabetes type 2, controlled ([place]) 04/28/2015  . Diverticulosis   . Dysphagia   . GERD (gastroesophageal reflux disease)   . Hepatosplenomegaly   . Hyperlipidemia   . Status post dilation of esophageal narrowing     Past Surgical History:  Procedure Laterality Date  . COLONOSCOPY    . ESOPHAGOGASTRODUODENOSCOPY    . LAPAROSCOPIC MYOTOMY      Family History  Problem Relation Age of Onset  . Hypertension Mother   . Heart disease Father   . Colon cancer Neg Hx   . Colon polyps Neg Hx   . Diabetes Neg Hx   . Kidney disease Neg Hx     Social History   Socioeconomic History  . Marital status: Married    Spouse name: Not on file  . Number of children: Not on file  . Years of education: Not on file  . Highest education level: Not on file  Social Needs  . Financial resource strain: Not on file  . Food insecurity - worry: Not on file  . Food insecurity - inability: Not on file  . Transportation needs - medical: Not on file  . Transportation needs - non-medical: Not on file  Occupational History  . Not on file  Tobacco Use  . Smoking status: Former Smoker    Packs/day: 0.20    Years: 5.00    Pack years: 1.00    Types: Cigarettes    Last attempt to quit: 11/01/1979    Years since quitting: 38.1  . Smokeless tobacco: Never Used  Substance and Sexual Activity  . Alcohol use: No  . Drug use: No  .  Sexual activity: No  Other Topics Concern  . Not on file  Social History Narrative  . Not on file    Outpatient Medications Prior to Visit  Medication Sig Dispense Refill  . ranitidine (ZANTAC) 150 MG tablet Take 150 mg by mouth 2 (two) times daily.    Marland Kitchen FLUoxetine (PROZAC) 20 MG/5ML solution Take 40 mg by mouth daily.     Facility-Administered Medications Prior to Visit  Medication Dose Route Frequency Provider Last Rate Last Dose  . 0.9 %  sodium chloride infusion  500 mL Intravenous Continuous Gatha Mayer, MD        No Known Allergies  Review of Systems  Constitutional: Negative for fever and malaise/fatigue.  HENT: Negative for congestion.   Eyes: Negative for blurred vision.  Respiratory: Negative for cough and shortness of breath.   Cardiovascular: Negative for chest pain, palpitations and leg swelling.  Gastrointestinal: Negative for vomiting.  Musculoskeletal: Negative for back pain.  Skin: Negative for rash.  Neurological: Negative for loss of consciousness and headaches.       Objective:    Physical Exam  Constitutional: She is oriented to  person, place, and time. She appears well-developed and well-nourished.  HENT:  Head: Normocephalic and atraumatic.  Eyes: Conjunctivae and EOM are normal.  Neck: Normal range of motion. Neck supple. No JVD present. Carotid bruit is not present. No thyromegaly present.  Cardiovascular: Normal rate, regular rhythm and normal heart sounds.  No murmur heard. Pulmonary/Chest: Effort normal and breath sounds normal. No respiratory distress. She has no wheezes. She has no rales. She exhibits no tenderness.  Musculoskeletal: She exhibits no edema.  Neurological: She is alert and oriented to person, place, and time.  Psychiatric: Her behavior is normal. Judgment and thought content normal. Her mood appears not anxious. Cognition and memory are normal. She exhibits a depressed mood.  Nursing note and vitals reviewed.   BP  130/80 (BP Location: Left Arm, Cuff Size: Normal)   Pulse 86   Temp 97.9 F (36.6 C) (Oral)   Resp 16   Ht 5' (1.524 m)   Wt 145 lb 3.2 oz (65.9 kg)   SpO2 95%   BMI 28.36 kg/m  Wt Readings from Last 3 Encounters:  12/06/17 145 lb 3.2 oz (65.9 kg)  11/26/17 145 lb (65.8 kg)  04/24/17 140 lb (63.5 kg)   BP Readings from Last 3 Encounters:  12/06/17 130/80  11/26/17 110/64  04/24/17 (!) 175/77     Immunization History  Administered Date(s) Administered  . Influenza,inj,Quad PF,6+ Mos 08/28/2016    Health Maintenance  Topic Date Due  . FOOT EXAM  01/07/1961  . TETANUS/TDAP  01/07/1970  . COLONOSCOPY  09/23/2013  . DEXA SCAN  01/08/2016  . PNA vac Low Risk Adult (1 of 2 - PCV13) 01/08/2016  . MAMMOGRAM  03/29/2017  . OPHTHALMOLOGY EXAM  04/26/2017  . HEMOGLOBIN A1C  04/30/2017  . INFLUENZA VACCINE  05/16/2017  . URINE MICROALBUMIN  10/31/2017  . Hepatitis C Screening  Completed    Lab Results  Component Value Date   WBC 8.3 10/31/2016   HGB 15.1 (H) 10/31/2016   HCT 43.3 10/31/2016   PLT 245.0 10/31/2016   GLUCOSE 96 10/31/2016   CHOL 190 10/31/2016   TRIG 258.0 (H) 10/31/2016   HDL 37.30 (L) 10/31/2016   LDLDIRECT 101.0 10/31/2016   LDLCALC 95 12/21/2010   ALT 29 03/20/2017   AST 25 03/20/2017   NA 135 10/31/2016   K 4.0 10/31/2016   CL 102 10/31/2016   CREATININE 0.89 10/31/2016   BUN 15 10/31/2016   CO2 25 10/31/2016   TSH 2.30 10/31/2016   INR 1.0 03/11/2015   HGBA1C 6.1 10/31/2016   MICROALBUR 2.2 10/31/2016    Lab Results  Component Value Date   TSH 2.30 10/31/2016   Lab Results  Component Value Date   WBC 8.3 10/31/2016   HGB 15.1 (H) 10/31/2016   HCT 43.3 10/31/2016   MCV 84.3 10/31/2016   PLT 245.0 10/31/2016   Lab Results  Component Value Date   NA 135 10/31/2016   K 4.0 10/31/2016   CO2 25 10/31/2016   GLUCOSE 96 10/31/2016   BUN 15 10/31/2016   CREATININE 0.89 10/31/2016   BILITOT 0.5 03/20/2017   ALKPHOS 80 03/20/2017    AST 25 03/20/2017   ALT 29 03/20/2017   PROT 7.5 03/20/2017   ALBUMIN 4.4 03/20/2017   CALCIUM 10.0 10/31/2016   ANIONGAP 12 12/15/2014   GFR 67.48 10/31/2016   Lab Results  Component Value Date   CHOL 190 10/31/2016   Lab Results  Component Value Date   HDL 37.30 (  L) 10/31/2016   Lab Results  Component Value Date   LDLCALC 95 12/21/2010   Lab Results  Component Value Date   TRIG 258.0 (H) 10/31/2016   Lab Results  Component Value Date   CHOLHDL 5 10/31/2016   Lab Results  Component Value Date   HGBA1C 6.1 10/31/2016         Assessment & Plan:   Problem List Items Addressed This Visit      Unprioritized   Depression, major, single episode, moderate ([place]) - Primary    Add wellbutrin to the prozac F/u in 1 month or sooner prn Pt also given names and numbers for psychology and psychiatry       Relevant Medications   FLUoxetine (PROZAC) 20 MG/5ML solution   buPROPion (WELLBUTRIN XL) 150 MG 24 hr tablet    Other Visit Diagnoses    Hyperlipidemia LDL goal <100       Relevant Orders   Comprehensive metabolic panel   Hemoglobin A1c   Lipid panel   Fatty liver       Relevant Orders   Hepatitis B Surface AntiBODY   Hepatitis, Acute      I have changed Carrigan Brinson's FLUoxetine. I am also having her start on buPROPion. Additionally, I am having her maintain her ranitidine. We will continue to administer sodium chloride.  Meds ordered this encounter  Medications  . FLUoxetine (PROZAC) 20 MG/5ML solution    Sig: Take 10 mLs (40 mg total) by mouth daily.    Dispense:  900 mL    Refill:  1  . buPROPion (WELLBUTRIN XL) 150 MG 24 hr tablet    Sig: 2 po qd    Dispense:  60 tablet    Refill:  3    CMA served as scribe during this visit. History, Physical and Plan performed by medical provider. Documentation and orders reviewed and attested to.  Ann Held, DO

## 2017-12-06 NOTE — Patient Instructions (Signed)
Major Depressive Disorder, Adult Major depressive disorder (MDD) is a mental health condition. It may also be called clinical depression or unipolar depression. MDD usually causes feelings of sadness, hopelessness, or helplessness. MDD can also cause physical symptoms. It can interfere with work, school, relationships, and other everyday activities. MDD may be mild, moderate, or severe. It may occur once (single episode major depressive disorder) or it may occur multiple times (recurrent major depressive disorder). What are the causes? The exact cause of this condition is not known. MDD is most likely caused by a combination of things, which may include:  Genetic factors. These are traits that are passed along from parent to child.  Individual factors. Your personality, your behavior, and the way you handle your thoughts and feelings may contribute to MDD. This includes personality traits and behaviors learned from others.  Physical factors, such as: ? Differences in the part of your brain that controls emotion. This part of your brain may be different than it is in people who do not have MDD. ? Long-term (chronic) medical or psychiatric illnesses.  Social factors. Traumatic experiences or major life changes may play a role in the development of MDD.  What increases the risk? This condition is more likely to develop in women. The following factors may also make you more likely to develop MDD:  A family history of depression.  Troubled family relationships.  Abnormally low levels of certain brain chemicals.  Traumatic events in childhood, especially abuse or the loss of a parent.  Being under a lot of stress, or long-term stress, especially from upsetting life experiences or losses.  A history of: ? Chronic physical illness. ? Other mental health disorders. ? Substance abuse.  Poor living conditions.  Experiencing social exclusion or discrimination on a regular basis.  What are  the signs or symptoms? The main symptoms of MDD typically include:  Constant depressed or irritable mood.  Loss of interest in things and activities.  MDD symptoms may also include:  Sleeping or eating too much or too little.  Unexplained weight change.  Fatigue or low energy.  Feelings of worthlessness or guilt.  Difficulty thinking clearly or making decisions.  Thoughts of suicide or of harming others.  Physical agitation or weakness.  Isolation.  Severe cases of MDD may also occur with other symptoms, such as:  Delusions or hallucinations, in which you imagine things that are not real (psychotic depression).  Low-level depression that lasts at least a year (chronic depression or persistent depressive disorder).  Extreme sadness and hopelessness (melancholic depression).  Trouble speaking and moving (catatonic depression).  How is this diagnosed? This condition may be diagnosed based on:  Your symptoms.  Your medical history, including your mental health history. This may involve tests to evaluate your mental health. You may be asked questions about your lifestyle, including any drug and alcohol use, and how long you have had symptoms of MDD.  A physical exam.  Blood tests to rule out other conditions.  You must have a depressed mood and at least four other MDD symptoms most of the day, nearly every day in the same 2-week timeframe before your health care provider can confirm a diagnosis of MDD. How is this treated? This condition is usually treated by mental health professionals, such as psychologists, psychiatrists, and clinical social workers. You may need more than one type of treatment. Treatment may include:  Psychotherapy. This is also called talk therapy or counseling. Types of psychotherapy include: ? Cognitive behavioral   therapy (CBT). This type of therapy teaches you to recognize unhealthy feelings, thoughts, and behaviors, and replace them with  positive thoughts and actions. ? Interpersonal therapy (IPT). This helps you to improve the way you relate to and communicate with others. ? Family therapy. This treatment includes members of your family.  Medicine to treat anxiety and depression, or to help you control certain emotions and behaviors.  Lifestyle changes, such as: ? Limiting alcohol and drug use. ? Exercising regularly. ? Getting plenty of sleep. ? Making healthy eating choices. ? Spending more time outdoors.  Treatments involving stimulation of the brain can be used in situations with extremely severe symptoms, or when medicine or other therapies do not work over time. These treatments include electroconvulsive therapy, transcranial magnetic stimulation, and vagal nerve stimulation. Follow these instructions at home: Activity  Return to your normal activities as told by your health care provider.  Exercise regularly and spend time outdoors as told by your health care provider. General instructions  Take over-the-counter and prescription medicines only as told by your health care provider.  Do not drink alcohol. If you drink alcohol, limit your alcohol intake to no more than 1 drink a day for nonpregnant women and 2 drinks a day for men. One drink equals 12 oz of beer, 5 oz of wine, or 1 oz of hard liquor. Alcohol can affect any antidepressant medicines you are taking. Talk to your health care provider about your alcohol use.  Eat a healthy diet and get plenty of sleep.  Find activities that you enjoy doing, and make time to do them.  Consider joining a support group. Your health care provider may be able to recommend a support group.  Keep all follow-up visits as told by your health care provider. This is important. Where to find more information: National Alliance on Mental Illness  www.nami.org  U.S. National Institute of Mental Health  www.nimh.nih.gov  National Suicide Prevention  Lifeline  1-800-273-TALK (8255). This is free, 24-hour help.  Contact a health care provider if:  Your symptoms get worse.  You develop new symptoms. Get help right away if:  You self-harm.  You have serious thoughts about hurting yourself or others.  You see, hear, taste, smell, or feel things that are not present (hallucinate). This information is not intended to replace advice given to you by your health care provider. Make sure you discuss any questions you have with your health care provider. Document Released: 01/27/2013 Document Revised: 06/08/2016 Document Reviewed: 04/12/2016 Elsevier Interactive Patient Education  2018 Elsevier Inc.  

## 2017-12-06 NOTE — Assessment & Plan Note (Signed)
Add wellbutrin to the prozac F/u in 1 month or sooner prn Pt also given names and numbers for psychology and psychiatry

## 2017-12-07 LAB — HEPATITIS PANEL, ACUTE
HEP B S AG: NONREACTIVE
HEP C AB: NONREACTIVE
Hep A IgM: NONREACTIVE
Hep B C IgM: NONREACTIVE
SIGNAL TO CUT-OFF: 0.01 (ref <1.00)

## 2017-12-07 LAB — HEPATITIS B SURFACE ANTIBODY,QUALITATIVE: Hep B S Ab: REACTIVE — AB

## 2017-12-10 NOTE — Addendum Note (Signed)
Addended by: Silvano Rusk E on: 12/10/2017 12:56 PM   Modules accepted: Orders

## 2017-12-12 ENCOUNTER — Other Ambulatory Visit: Payer: Self-pay | Admitting: *Deleted

## 2017-12-12 DIAGNOSIS — E785 Hyperlipidemia, unspecified: Secondary | ICD-10-CM

## 2017-12-12 MED ORDER — FENOFIBRATE 160 MG PO TABS: 160.0000 mg | ORAL_TABLET | Freq: Every day | ORAL | 2 refills | Status: DC

## 2018-01-03 ENCOUNTER — Ambulatory Visit: Payer: BLUE CROSS/BLUE SHIELD | Admitting: Family Medicine

## 2018-01-03 DIAGNOSIS — M6530 Trigger finger, unspecified finger: Secondary | ICD-10-CM | POA: Insufficient documentation

## 2018-01-03 DIAGNOSIS — M65332 Trigger finger, left middle finger: Secondary | ICD-10-CM | POA: Diagnosis not present

## 2018-01-03 DIAGNOSIS — M79642 Pain in left hand: Secondary | ICD-10-CM | POA: Insufficient documentation

## 2018-02-11 ENCOUNTER — Other Ambulatory Visit: Payer: Self-pay | Admitting: Family Medicine

## 2018-02-11 DIAGNOSIS — F329 Major depressive disorder, single episode, unspecified: Secondary | ICD-10-CM

## 2018-02-11 DIAGNOSIS — F32A Depression, unspecified: Secondary | ICD-10-CM

## 2018-02-12 ENCOUNTER — Other Ambulatory Visit: Payer: Self-pay | Admitting: Family Medicine

## 2018-02-12 NOTE — Telephone Encounter (Signed)
Copied from [place] [id number]. Topic: Quick Communication - Rx Refill/Question >> Feb 12, 2018  4:17 PM Robina Ade, Helene Kelp D wrote: Medication: Bustar Has the patient contacted their pharmacy? NO because its a new rx patient is requesting from the provider Dr. Carollee Herter. (Agent: If no, request that the patient contact the pharmacy for the refill.) Preferred Pharmacy (with phone number or street name): [id number] - [place], [place] Agent: Please be advised that RX refills may take up to 3 business days. We ask that you follow-up with your pharmacy.

## 2018-02-13 NOTE — Telephone Encounter (Signed)
Noted pt. Has scheduled an appt. With Dr. Carollee Herter for 02/18/18, to discuss Buspar.

## 2018-02-13 NOTE — Telephone Encounter (Signed)
Left message for pt. To call and schedule an appointment to discuss starting a new medication.

## 2018-02-18 ENCOUNTER — Ambulatory Visit: Payer: BLUE CROSS/BLUE SHIELD | Admitting: Family Medicine

## 2018-02-18 ENCOUNTER — Encounter: Payer: Self-pay | Admitting: Family Medicine

## 2018-02-18 VITALS — BP 123/65 | HR 83 | Temp 98.5°F | Resp 16 | Ht 60.0 in | Wt 143.2 lb

## 2018-02-18 DIAGNOSIS — E785 Hyperlipidemia, unspecified: Secondary | ICD-10-CM

## 2018-02-18 DIAGNOSIS — F321 Major depressive disorder, single episode, moderate: Secondary | ICD-10-CM | POA: Diagnosis not present

## 2018-02-18 DIAGNOSIS — F419 Anxiety disorder, unspecified: Secondary | ICD-10-CM

## 2018-02-18 DIAGNOSIS — E559 Vitamin D deficiency, unspecified: Secondary | ICD-10-CM | POA: Diagnosis not present

## 2018-02-18 DIAGNOSIS — F329 Major depressive disorder, single episode, unspecified: Secondary | ICD-10-CM

## 2018-02-18 MED ORDER — BUSPIRONE HCL 5 MG PO TABS: 5.0000 mg | ORAL_TABLET | Freq: Two times a day (BID) | ORAL | 0 refills | Status: DC

## 2018-02-18 MED ORDER — BUPROPION HCL ER (XL) 150 MG PO TB24: ORAL_TABLET | ORAL | 3 refills | Status: DC

## 2018-02-18 NOTE — Progress Notes (Signed)
Patient ID: [name] [name], female   DOB: [birth year], 67 y.o.   MRN: [id number]      Subjective:  I acted as a Education administrator for Dr. Carollee Herter.  Guerry Bruin, [place]   Patient ID: [name] [name], female    DOB: 31-Dec-1950, 67 y.o.   MRN: [id number]  Chief Complaint  Patient presents with  . requesting new med, Buspar    HPI  Patient is in today to see if she can add Buspar for anxiety to take with her Wellbutrin.  One of her friends recommended it.  She has been on celexa, prozac , zoloft and did n't like any of them.  She is very happy with the wellbutrin.   We will also do labs to f/u lipids.    Patient Care Team: Carollee Herter, Alferd Apa, DO as PCP - General (Family Medicine)   Past Medical History:  Diagnosis Date  . Achalasia   . Anxiety and depression   . Diabetes type 2, controlled ([place]) 04/28/2015  . Diverticulosis   . Dysphagia   . GERD (gastroesophageal reflux disease)   . Hepatosplenomegaly   . Hyperlipidemia   . Status post dilation of esophageal narrowing     Past Surgical History:  Procedure Laterality Date  . COLONOSCOPY    . ESOPHAGOGASTRODUODENOSCOPY    . LAPAROSCOPIC MYOTOMY      Family History  Problem Relation Age of Onset  . Hypertension Mother   . Heart disease Father   . Colon cancer Neg Hx   . Colon polyps Neg Hx   . Diabetes Neg Hx   . Kidney disease Neg Hx     Social History   Socioeconomic History  . Marital status: Married    Spouse name: Not on file  . Number of children: Not on file  . Years of education: Not on file  . Highest education level: Not on file  Occupational History  . Not on file  Social Needs  . Financial resource strain: Not on file  . Food insecurity:    Worry: Not on file    Inability: Not on file  . Transportation needs:    Medical: Not on file    Non-medical: Not on file  Tobacco Use  . Smoking status: Former Smoker    Packs/day: 0.20    Years: 5.00    Pack years: 1.00    Types: Cigarettes    Last attempt to quit:  11/01/1979    Years since quitting: 38.3  . Smokeless tobacco: Never Used  Substance and Sexual Activity  . Alcohol use: No  . Drug use: No  . Sexual activity: Never  Lifestyle  . Physical activity:    Days per week: Not on file    Minutes per session: Not on file  . Stress: Not on file  Relationships  . Social connections:    Talks on phone: Not on file    Gets together: Not on file    Attends religious service: Not on file    Active member of club or organization: Not on file    Attends meetings of clubs or organizations: Not on file    Relationship status: Not on file  . Intimate partner violence:    Fear of current or ex partner: Not on file    Emotionally abused: Not on file    Physically abused: Not on file    Forced sexual activity: Not on file  Other Topics Concern  . Not on file  Social History Narrative  .  Not on file    Outpatient Medications Prior to Visit  Medication Sig Dispense Refill  . fenofibrate 160 MG tablet Take 1 tablet (160 mg total) by mouth daily. 30 tablet 2  . ranitidine (ZANTAC) 150 MG tablet Take 150 mg by mouth 2 (two) times daily.    Marland Kitchen buPROPion (WELLBUTRIN XL) 150 MG 24 hr tablet 2 po qd 60 tablet 3  . FLUoxetine (PROZAC) 20 MG/5ML solution Take 10 mLs (40 mg total) by mouth daily. 900 mL 1  . FLUoxetine (PROZAC) 20 MG/5ML solution TAKE 10 ML DAILY 900 mL 1   No facility-administered medications prior to visit.     No Known Allergies  Review of Systems  Constitutional: Negative for chills, fever and malaise/fatigue.  HENT: Negative for congestion and hearing loss.   Eyes: Negative for discharge.  Respiratory: Negative for cough, sputum production and shortness of breath.   Cardiovascular: Negative for chest pain, palpitations and leg swelling.  Gastrointestinal: Negative for abdominal pain, blood in stool, constipation, diarrhea, heartburn, nausea and vomiting.  Genitourinary: Negative for dysuria, frequency, hematuria and urgency.    Musculoskeletal: Negative for back pain, falls and myalgias.  Skin: Negative for rash.  Neurological: Negative for dizziness, sensory change, loss of consciousness, weakness and headaches.  Endo/Heme/Allergies: Negative for environmental allergies. Does not bruise/bleed easily.  Psychiatric/Behavioral: Positive for depression. Negative for suicidal ideas. The patient is nervous/anxious. The patient does not have insomnia.        Objective:    Physical Exam  Constitutional: She is oriented to person, place, and time. She appears well-developed and well-nourished.  HENT:  Head: Normocephalic and atraumatic.  Eyes: Conjunctivae and EOM are normal.  Neck: Normal range of motion. Neck supple. No JVD present. Carotid bruit is not present. No thyromegaly present.  Cardiovascular: Normal rate, regular rhythm and normal heart sounds.  No murmur heard. Pulmonary/Chest: Effort normal and breath sounds normal. No respiratory distress. She has no wheezes. She has no rales. She exhibits no tenderness.  Musculoskeletal: She exhibits no edema.  Neurological: She is alert and oriented to person, place, and time.  Psychiatric: She has a normal mood and affect.    BP 123/65 (BP Location: Left Arm, Cuff Size: Normal)   Pulse 83   Temp 98.5 F (36.9 C) (Oral)   Resp 16   Ht 5' (1.524 m)   Wt 143 lb 3.2 oz (65 kg)   SpO2 96%   BMI 27.97 kg/m  Wt Readings from Last 3 Encounters:  02/18/18 143 lb 3.2 oz (65 kg)  12/06/17 145 lb 3.2 oz (65.9 kg)  11/26/17 145 lb (65.8 kg)   BP Readings from Last 3 Encounters:  02/18/18 123/65  12/06/17 130/80  11/26/17 110/64     Immunization History  Administered Date(s) Administered  . Influenza,inj,Quad PF,6+ Mos 08/28/2016    Health Maintenance  Topic Date Due  . FOOT EXAM  01/07/1961  . TETANUS/TDAP  01/07/1970  . COLONOSCOPY  09/23/2013  . DEXA SCAN  01/08/2016  . PNA vac Low Risk Adult (1 of 2 - PCV13) 01/08/2016  . MAMMOGRAM  03/29/2017  .  OPHTHALMOLOGY EXAM  04/26/2017  . URINE MICROALBUMIN  10/31/2017  . INFLUENZA VACCINE  05/16/2018  . HEMOGLOBIN A1C  06/05/2018  . Hepatitis C Screening  Completed    Lab Results  Component Value Date   WBC 8.3 10/31/2016   HGB 15.1 (H) 10/31/2016   HCT 43.3 10/31/2016   PLT 245.0 10/31/2016   GLUCOSE 113 (H)  12/06/2017   CHOL 184 12/06/2017   TRIG 301.0 (H) 12/06/2017   HDL 36.00 (L) 12/06/2017   LDLDIRECT 98.0 12/06/2017   LDLCALC 95 12/21/2010   ALT 43 (H) 12/06/2017   AST 33 12/06/2017   NA 136 12/06/2017   K 4.3 12/06/2017   CL 102 12/06/2017   CREATININE 0.86 12/06/2017   BUN 15 12/06/2017   CO2 26 12/06/2017   TSH 2.30 10/31/2016   INR 1.0 03/11/2015   HGBA1C 6.3 12/06/2017   MICROALBUR 2.2 10/31/2016    Lab Results  Component Value Date   TSH 2.30 10/31/2016   Lab Results  Component Value Date   WBC 8.3 10/31/2016   HGB 15.1 (H) 10/31/2016   HCT 43.3 10/31/2016   MCV 84.3 10/31/2016   PLT 245.0 10/31/2016   Lab Results  Component Value Date   NA 136 12/06/2017   K 4.3 12/06/2017   CO2 26 12/06/2017   GLUCOSE 113 (H) 12/06/2017   BUN 15 12/06/2017   CREATININE 0.86 12/06/2017   BILITOT 0.7 12/06/2017   ALKPHOS 68 12/06/2017   AST 33 12/06/2017   ALT 43 (H) 12/06/2017   PROT 7.3 12/06/2017   ALBUMIN 4.2 12/06/2017   CALCIUM 10.2 12/06/2017   ANIONGAP 12 12/15/2014   GFR 69.97 12/06/2017   Lab Results  Component Value Date   CHOL 184 12/06/2017   Lab Results  Component Value Date   HDL 36.00 (L) 12/06/2017   Lab Results  Component Value Date   LDLCALC 95 12/21/2010   Lab Results  Component Value Date   TRIG 301.0 (H) 12/06/2017   Lab Results  Component Value Date   CHOLHDL 5 12/06/2017   Lab Results  Component Value Date   HGBA1C 6.3 12/06/2017         Assessment & Plan:   Problem List Items Addressed This Visit      Unprioritized   Anxiety and depression   Relevant Medications   busPIRone (BUSPAR) 5 MG tablet    buPROPion (WELLBUTRIN XL) 150 MG 24 hr tablet   Depression, major, single episode, moderate ([facility])   Relevant Medications   busPIRone (BUSPAR) 5 MG tablet   buPROPion (WELLBUTRIN XL) 150 MG 24 hr tablet    Other Visit Diagnoses    Vitamin D deficiency    -  Primary   Relevant Orders   VITAMIN D 25 Hydroxy (Vit-D Deficiency, Fractures)   Hyperlipidemia LDL goal <100       Relevant Orders   Lipid panel   Comprehensive metabolic panel      I have discontinued Gabriellia Tissue's FLUoxetine and FLUoxetine. I am also having her start on busPIRone. Additionally, I am having her maintain her ranitidine, fenofibrate, and buPROPion.  Meds ordered this encounter  Medications  . busPIRone (BUSPAR) 5 MG tablet    Sig: Take 1 tablet (5 mg total) by mouth 2 (two) times daily.    Dispense:  60 tablet    Refill:  0  . buPROPion (WELLBUTRIN XL) 150 MG 24 hr tablet    Sig: 2 po qd    Dispense:  180 tablet    Refill:  3    CMA served as scribe during this visit. History, Physical and Plan performed by medical provider. Documentation and orders reviewed and attested to.  Ann Held, DO

## 2018-02-18 NOTE — Patient Instructions (Signed)

## 2018-02-19 LAB — COMPREHENSIVE METABOLIC PANEL WITH GFR
ALT: 50 U/L — ABNORMAL HIGH (ref 0–35)
AST: 38 U/L — ABNORMAL HIGH (ref 0–37)
Albumin: 4.3 g/dL (ref 3.5–5.2)
Alkaline Phosphatase: 72 U/L (ref 39–117)
BUN: 15 mg/dL (ref 6–23)
CO2: 26 meq/L (ref 19–32)
Calcium: 10.1 mg/dL (ref 8.4–10.5)
Chloride: 101 meq/L (ref 96–112)
Creatinine, Ser: 0.91 mg/dL (ref 0.40–1.20)
GFR: 65.51 mL/min
Glucose, Bld: 119 mg/dL — ABNORMAL HIGH (ref 70–99)
Potassium: 4 meq/L (ref 3.5–5.1)
Sodium: 138 meq/L (ref 135–145)
Total Bilirubin: 0.4 mg/dL (ref 0.2–1.2)
Total Protein: 7.2 g/dL (ref 6.0–8.3)

## 2018-02-19 LAB — VITAMIN D 25 HYDROXY (VIT D DEFICIENCY, FRACTURES): VITD: 53.42 ng/mL (ref 30.00–100.00)

## 2018-02-19 LAB — LIPID PANEL
Cholesterol: 195 mg/dL (ref 0–200)
HDL: 35.1 mg/dL — ABNORMAL LOW
Total CHOL/HDL Ratio: 6
Triglycerides: 528 mg/dL — ABNORMAL HIGH (ref 0.0–149.0)

## 2018-02-19 LAB — LDL CHOLESTEROL, DIRECT: Direct LDL: 99 mg/dL

## 2018-02-25 ENCOUNTER — Telehealth: Payer: Self-pay

## 2018-02-25 DIAGNOSIS — E785 Hyperlipidemia, unspecified: Secondary | ICD-10-CM

## 2018-02-25 NOTE — Telephone Encounter (Signed)
Author phoned pt. to discuss labs and Dr. Nonda Lou recommendations, but no answer. VM left with call back numbers [phone] and [phone]. Awaiting call back.

## 2018-02-25 NOTE — Telephone Encounter (Signed)
-----   Message from Ann Held, DO sent at 02/20/2018 11:00 AM EDT ----- Cholesterol--- LDL goal < 100,  HDL >40,  TG < 150.  Diet and exercise will increase HDL and decrease LDL and TG.  Fish,  Fish Oil, Flaxseed oil will also help increase the HDL and decrease Triglycerides.   Recheck labs in 3 months I don't thing she is taking the fenofibrate---- she does need that ----  For fish oil she can take mega red --- they are smaller Repeat lipid, cmp in 3 months-- dx hyperlipidemia.

## 2018-02-27 NOTE — Telephone Encounter (Signed)
Author phoned pt. Again to discuss labs and review medications, phone went to VM. Chief Strategy Officer then phoned husband, Cornelia Copa on cell phone and informed him of pt's need to make an appointment to f/u cholesterol and anxiety. Husband stated he would remind his wife to call, and he stated he knew the number to call.

## 2018-02-28 ENCOUNTER — Other Ambulatory Visit: Payer: Self-pay

## 2018-02-28 DIAGNOSIS — E785 Hyperlipidemia, unspecified: Secondary | ICD-10-CM

## 2018-02-28 MED ORDER — FENOFIBRATE 160 MG PO TABS: 160.0000 mg | ORAL_TABLET | Freq: Every day | ORAL | 2 refills | Status: DC

## 2018-02-28 NOTE — Telephone Encounter (Signed)
Talked to patient, results reviewed with her over the phone, she understands and will start Fenofibrate. Rx was sent ad orders entered for her to come back for repeat labs in 6 months.

## 2018-03-01 ENCOUNTER — Telehealth: Payer: Self-pay

## 2018-03-01 NOTE — Telephone Encounter (Signed)
LM for the patient, advising she saw Nicoletta Ba PA on 11-26-17 and we had ordered the Cologuard test for her.  I advised we got a message from [employer] that the test has not been completed. I asked if she intended to do the test ? Asked her to call Topanga Alvelo P. Here at [facility], [phone], choose option 2.

## 2018-03-01 NOTE — Telephone Encounter (Signed)
As of today the patient has not completed her cologuard that was ordered on 11-26-2017.

## 2018-03-08 ENCOUNTER — Telehealth: Payer: Self-pay | Admitting: *Deleted

## 2018-03-08 NOTE — Telephone Encounter (Signed)
Author phoned re: buspar and Dr. Nonda Lou recommendation, left generic message on VM. Awaiting call-back [phone].

## 2018-03-08 NOTE — Telephone Encounter (Signed)
Copied from [place] [phone]. Topic: General - Other >> Mar 08, 2018  9:19 AM Carolyn Stare wrote:  Pt said she was told to call back if the below med was not working for her  busPIRone (BUSPAR) 5 MG tablet

## 2018-03-08 NOTE — Telephone Encounter (Signed)
Can increase to 10 mg bid

## 2018-03-12 NOTE — Telephone Encounter (Signed)
Left message to call back to see if she is ok with the increase to 10mg  bid.

## 2018-03-12 NOTE — Telephone Encounter (Signed)
Left another message for the patient to please call me regarding the Cologuard test.

## 2018-03-13 MED ORDER — BUSPIRONE HCL 10 MG PO TABS: 10.0000 mg | ORAL_TABLET | Freq: Two times a day (BID) | ORAL | 0 refills | Status: DC

## 2018-03-13 NOTE — Addendum Note (Signed)
Addended by: Kem Boroughs D on: 03/13/2018 09:23 AM   Modules accepted: Orders

## 2018-03-13 NOTE — Telephone Encounter (Signed)
Patient returned call and she stated that she would like to try the increase.  New rx for 10mg  bid sent to pharmacy.

## 2018-03-14 NOTE — Telephone Encounter (Signed)
LM for the patient advising when she was here to see Nicoletta Ba PA. Amy ordered the COLOGUARD test. [facility] contacted [place] advising the patient  Did not return the test kit.  I just wanted to know if she intended to do the test.

## 2018-03-18 ENCOUNTER — Encounter: Payer: Self-pay | Admitting: Family Medicine

## 2018-03-19 ENCOUNTER — Other Ambulatory Visit: Payer: Self-pay | Admitting: *Deleted

## 2018-03-19 MED ORDER — BUSPIRONE HCL 10 MG PO TABS: 10.0000 mg | ORAL_TABLET | Freq: Two times a day (BID) | ORAL | 0 refills | Status: DC

## 2018-03-19 MED ORDER — ACETAZOLAMIDE 125 MG PO TABS: 125.0000 mg | ORAL_TABLET | Freq: Two times a day (BID) | ORAL | 0 refills | Status: DC

## 2018-03-19 NOTE — Telephone Encounter (Signed)
Acetazolamide 125 mg bid  --- #20  When I look up the med it says to take bid for prevention not tid

## 2018-03-26 ENCOUNTER — Ambulatory Visit: Payer: BLUE CROSS/BLUE SHIELD | Admitting: Family Medicine

## 2018-03-26 ENCOUNTER — Encounter: Payer: Self-pay | Admitting: Family Medicine

## 2018-03-26 VITALS — BP 117/63 | HR 110 | Temp 98.3°F | Resp 16 | Wt 141.4 lb

## 2018-03-26 DIAGNOSIS — Z298 Encounter for other specified prophylactic measures: Secondary | ICD-10-CM

## 2018-03-26 MED ORDER — ACETAZOLAMIDE 125 MG PO TABS: 125.0000 mg | ORAL_TABLET | Freq: Three times a day (TID) | ORAL | 0 refills | Status: DC

## 2018-03-26 NOTE — Progress Notes (Signed)
Subjective:  I acted as a Education administrator for [employer]. [name] [name], [name] [name]   Patient ID: [name] [name], female    DOB: [birth year], 67 y.o.   MRN: [id number]  Chief Complaint  Patient presents with  . Medication Refill    HPI  Patient is in today to discuss medications.  She really needs to take the diamox tid while in [place].  She gets very sick.  No other complaints.  Patient Care Team: Carollee Herter, Alferd Apa, DO as PCP - General (Family Medicine)   Past Medical History:  Diagnosis Date  . Achalasia   . Anxiety and depression   . Diabetes type 2, controlled ([place]) 04/28/2015  . Diverticulosis   . Dysphagia   . GERD (gastroesophageal reflux disease)   . Hepatosplenomegaly   . Hyperlipidemia   . Status post dilation of esophageal narrowing     Past Surgical History:  Procedure Laterality Date  . COLONOSCOPY    . ESOPHAGOGASTRODUODENOSCOPY    . LAPAROSCOPIC MYOTOMY      Family History  Problem Relation Age of Onset  . Hypertension Mother   . Heart disease Father   . Colon cancer Neg Hx   . Colon polyps Neg Hx   . Diabetes Neg Hx   . Kidney disease Neg Hx     Social History   Socioeconomic History  . Marital status: Married    Spouse name: Not on file  . Number of children: Not on file  . Years of education: Not on file  . Highest education level: Not on file  Occupational History  . Not on file  Social Needs  . Financial resource strain: Not on file  . Food insecurity:    Worry: Not on file    Inability: Not on file  . Transportation needs:    Medical: Not on file    Non-medical: Not on file  Tobacco Use  . Smoking status: Former Smoker    Packs/day: 0.20    Years: 5.00    Pack years: 1.00    Types: Cigarettes    Last attempt to quit: 11/01/1979    Years since quitting: 38.4  . Smokeless tobacco: Never Used  Substance and Sexual Activity  . Alcohol use: No  . Drug use: No  . Sexual activity: Never  Lifestyle  . Physical activity:    Days per week: Not  on file    Minutes per session: Not on file  . Stress: Not on file  Relationships  . Social connections:    Talks on phone: Not on file    Gets together: Not on file    Attends religious service: Not on file    Active member of club or organization: Not on file    Attends meetings of clubs or organizations: Not on file    Relationship status: Not on file  . Intimate partner violence:    Fear of current or ex partner: Not on file    Emotionally abused: Not on file    Physically abused: Not on file    Forced sexual activity: Not on file  Other Topics Concern  . Not on file  Social History Narrative  . Not on file    Outpatient Medications Prior to Visit  Medication Sig Dispense Refill  . buPROPion (WELLBUTRIN XL) 150 MG 24 hr tablet 2 po qd 180 tablet 3  . busPIRone (BUSPAR) 10 MG tablet Take 1 tablet (10 mg total) by mouth 2 (two) times daily. 180 tablet 0  .  fenofibrate 160 MG tablet Take 1 tablet (160 mg total) by mouth daily. 30 tablet 2  . ranitidine (ZANTAC) 150 MG tablet Take 150 mg by mouth 2 (two) times daily.    Marland Kitchen acetaZOLAMIDE (DIAMOX) 125 MG tablet Take 1 tablet (125 mg total) by mouth 2 (two) times daily. 20 tablet 0   No facility-administered medications prior to visit.     No Known Allergies  Review of Systems  Constitutional: Negative for chills, fever and malaise/fatigue.  HENT: Negative for congestion and hearing loss.   Eyes: Negative for discharge.  Respiratory: Negative for cough, sputum production and shortness of breath.   Cardiovascular: Negative for chest pain, palpitations and leg swelling.  Gastrointestinal: Negative for abdominal pain, blood in stool, constipation, diarrhea, heartburn, nausea and vomiting.  Genitourinary: Negative for dysuria, frequency, hematuria and urgency.  Musculoskeletal: Negative for back pain, falls and myalgias.  Skin: Negative for rash.  Neurological: Negative for dizziness, sensory change, loss of consciousness,  weakness and headaches.  Endo/Heme/Allergies: Negative for environmental allergies. Does not bruise/bleed easily.  Psychiatric/Behavioral: Negative for depression and suicidal ideas. The patient is not nervous/anxious and does not have insomnia.        Objective:    Physical Exam  Psychiatric: She has a normal mood and affect. Her behavior is normal. Judgment and thought content normal.  Nursing note and vitals reviewed.   BP 117/63 (BP Location: Left Arm, Patient Position: Sitting, Cuff Size: Normal)   Pulse (!) 110   Temp 98.3 F (36.8 C) (Oral)   Resp 16   Wt 141 lb 6.4 oz (64.1 kg)   SpO2 98%   BMI 27.62 kg/m  Wt Readings from Last 3 Encounters:  03/26/18 141 lb 6.4 oz (64.1 kg)  02/18/18 143 lb 3.2 oz (65 kg)  12/06/17 145 lb 3.2 oz (65.9 kg)   BP Readings from Last 3 Encounters:  03/26/18 117/63  02/18/18 123/65  12/06/17 130/80     Immunization History  Administered Date(s) Administered  . Influenza,inj,Quad PF,6+ Mos 08/28/2016    Health Maintenance  Topic Date Due  . FOOT EXAM  01/07/1961  . DEXA SCAN  01/08/2016  . PNA vac Low Risk Adult (1 of 2 - PCV13) 01/08/2016  . OPHTHALMOLOGY EXAM  04/26/2017  . URINE MICROALBUMIN  10/31/2017  . MAMMOGRAM  03/27/2019 (Originally 03/29/2017)  . COLONOSCOPY  03/27/2019 (Originally 09/23/2013)  . TETANUS/TDAP  03/27/2019 (Originally 01/07/1970)  . INFLUENZA VACCINE  05/16/2018  . HEMOGLOBIN A1C  06/05/2018  . Hepatitis C Screening  Completed    Lab Results  Component Value Date   WBC 8.3 10/31/2016   HGB 15.1 (H) 10/31/2016   HCT 43.3 10/31/2016   PLT 245.0 10/31/2016   GLUCOSE 119 (H) 02/18/2018   CHOL 195 02/18/2018   TRIG (H) 02/18/2018    528.0 Triglyceride is over 400; calculations on Lipids are invalid.   HDL 35.10 (L) 02/18/2018   LDLDIRECT 99.0 02/18/2018   LDLCALC 95 12/21/2010   ALT 50 (H) 02/18/2018   AST 38 (H) 02/18/2018   NA 138 02/18/2018   K 4.0 02/18/2018   CL 101 02/18/2018   CREATININE  0.91 02/18/2018   BUN 15 02/18/2018   CO2 26 02/18/2018   TSH 2.30 10/31/2016   INR 1.0 03/11/2015   HGBA1C 6.3 12/06/2017   MICROALBUR 2.2 10/31/2016    Lab Results  Component Value Date   TSH 2.30 10/31/2016   Lab Results  Component Value Date   WBC 8.3 10/31/2016  HGB 15.1 (H) 10/31/2016   HCT 43.3 10/31/2016   MCV 84.3 10/31/2016   PLT 245.0 10/31/2016   Lab Results  Component Value Date   NA 138 02/18/2018   K 4.0 02/18/2018   CO2 26 02/18/2018   GLUCOSE 119 (H) 02/18/2018   BUN 15 02/18/2018   CREATININE 0.91 02/18/2018   BILITOT 0.4 02/18/2018   ALKPHOS 72 02/18/2018   AST 38 (H) 02/18/2018   ALT 50 (H) 02/18/2018   PROT 7.2 02/18/2018   ALBUMIN 4.3 02/18/2018   CALCIUM 10.1 02/18/2018   ANIONGAP 12 12/15/2014   GFR 65.51 02/18/2018   Lab Results  Component Value Date   CHOL 195 02/18/2018   Lab Results  Component Value Date   HDL 35.10 (L) 02/18/2018   Lab Results  Component Value Date   LDLCALC 95 12/21/2010   Lab Results  Component Value Date   TRIG (H) 02/18/2018    528.0 Triglyceride is over 400; calculations on Lipids are invalid.   Lab Results  Component Value Date   CHOLHDL 6 02/18/2018   Lab Results  Component Value Date   HGBA1C 6.3 12/06/2017         Assessment & Plan:   Problem List Items Addressed This Visit    None    Visit Diagnoses    Altitude sickness preventative measures    -  Primary   Relevant Medications   acetaZOLAMIDE (DIAMOX) 125 MG tablet    #30 given to pt to add extra so she can take it tid for 10 days   I have changed Salvador Deleon's acetaZOLAMIDE. I am also having her maintain her ranitidine, buPROPion, fenofibrate, and busPIRone.  Meds ordered this encounter  Medications  . acetaZOLAMIDE (DIAMOX) 125 MG tablet    Sig: Take 1 tablet (125 mg total) by mouth 3 (three) times daily.    Dispense:  30 tablet    Refill:  0    CMA served as scribe during this visit. History, Physical and Plan  performed by medical provider. Documentation and orders reviewed and attested to.  Ann Held, DO

## 2018-03-26 NOTE — Patient Instructions (Signed)
Altitude Sickness Altitude sickness happens when you go to a high place (high altitude) without giving your body time to get used to it as you go higher and higher. Altitude sickness can be an emergency. It can be life-threatening. Follow these instructions at home:  If you have to exercise, do only light exercise for the first 24-36 hours after treatment.  Drink enough fluids to keep your pee (urine) clear or pale yellow.  Eat small, light meals.  Avoid: ? Any tobacco products, such as cigarettes, chewing tobacco, or e-cigarettes. ? Taking calming medicines (sedatives). ? Alcohol.  Stay in low places.  Have someone stay with you until you feel normal.  Keep all follow-up visits as told by your doctor. This is important. How is this prevented?  Go to high places slowly.  Go to high places during the day and go back to low places at night.  Before you do physical activities, give your body a few days to get used to being in high places.  Ask your doctor about medicines you can take to prevent altitude sickness. Get help right away if:  You have: ? Chest pain or tightness. ? A fast heartbeat. ? A very bad headache. ? A very bad cough. ? Trouble walking. ? Trouble focusing your mind (concentrating).  You feel: ? Very short of breath. ? Confused. This information is not intended to replace advice given to you by your health care provider. Make sure you discuss any questions you have with your health care provider. Document Released: 12/25/2011 Document Revised: 10/05/2016 Document Reviewed: 12/01/2011 Elsevier Interactive Patient Education  [employer].

## 2018-04-09 ENCOUNTER — Other Ambulatory Visit: Payer: Self-pay | Admitting: Family Medicine

## 2018-04-10 ENCOUNTER — Ambulatory Visit: Payer: BLUE CROSS/BLUE SHIELD | Admitting: Physician Assistant

## 2018-04-11 DIAGNOSIS — M65332 Trigger finger, left middle finger: Secondary | ICD-10-CM | POA: Diagnosis not present

## 2018-05-27 ENCOUNTER — Other Ambulatory Visit: Payer: Self-pay | Admitting: Family Medicine

## 2018-05-27 DIAGNOSIS — Z1231 Encounter for screening mammogram for malignant neoplasm of breast: Secondary | ICD-10-CM

## 2018-06-14 ENCOUNTER — Ambulatory Visit: Payer: BLUE CROSS/BLUE SHIELD | Admitting: Internal Medicine

## 2018-06-21 ENCOUNTER — Ambulatory Visit
Admission: RE | Admit: 2018-06-21 | Discharge: 2018-06-21 | Disposition: A | Discharge status: Home / Self Care | Payer: BLUE CROSS/BLUE SHIELD | Source: Ambulatory Visit | Attending: Family Medicine | Admitting: Family Medicine

## 2018-06-21 DIAGNOSIS — Z1231 Encounter for screening mammogram for malignant neoplasm of breast: Secondary | ICD-10-CM | POA: Diagnosis not present

## 2018-07-16 ENCOUNTER — Ambulatory Visit: Payer: BLUE CROSS/BLUE SHIELD | Admitting: Family Medicine

## 2018-07-22 ENCOUNTER — Ambulatory Visit: Payer: BLUE CROSS/BLUE SHIELD | Admitting: Family Medicine

## 2018-07-22 ENCOUNTER — Encounter: Payer: Self-pay | Admitting: Family Medicine

## 2018-07-22 VITALS — BP 118/64 | HR 93 | Temp 98.6°F | Resp 16 | Ht 60.0 in | Wt 137.4 lb

## 2018-07-22 DIAGNOSIS — F418 Other specified anxiety disorders: Secondary | ICD-10-CM

## 2018-07-22 MED ORDER — ESCITALOPRAM OXALATE 10 MG PO TABS: 10.0000 mg | ORAL_TABLET | Freq: Every day | ORAL | 2 refills | Status: DC

## 2018-07-22 NOTE — Progress Notes (Signed)
Patient ID: [name] [name], female   DOB: [birth year], 67 y.o.   MRN: [id number]     Subjective:  I acted as a Education administrator for Dr. Carollee Herter.  Guerry Bruin, [place]   Patient ID: [name] [name], female    DOB: 1951-01-17, 67 y.o.   MRN: [id number]  Chief Complaint  Patient presents with  . Depression    HPI  Patient is in today for follow up depression.  She would like to see if she can change her medication.  She really likes the wellbutrin but feels like she needs more.  zoloft caused hot flashes and she did not like buspar or prozac.    Patient Care Team: Carollee Herter, Alferd Apa, DO as PCP - General (Family Medicine)   Past Medical History:  Diagnosis Date  . Achalasia   . Anxiety and depression   . Diabetes type 2, controlled ([place]) 04/28/2015  . Diverticulosis   . Dysphagia   . GERD (gastroesophageal reflux disease)   . Hepatosplenomegaly   . Hyperlipidemia   . Status post dilation of esophageal narrowing     Past Surgical History:  Procedure Laterality Date  . COLONOSCOPY    . ESOPHAGOGASTRODUODENOSCOPY    . LAPAROSCOPIC MYOTOMY      Family History  Problem Relation Age of Onset  . Hypertension Mother   . Heart disease Father   . Colon cancer Neg Hx   . Colon polyps Neg Hx   . Diabetes Neg Hx   . Kidney disease Neg Hx     Social History   Socioeconomic History  . Marital status: Married    Spouse name: Not on file  . Number of children: Not on file  . Years of education: Not on file  . Highest education level: Not on file  Occupational History  . Not on file  Social Needs  . Financial resource strain: Not on file  . Food insecurity:    Worry: Not on file    Inability: Not on file  . Transportation needs:    Medical: Not on file    Non-medical: Not on file  Tobacco Use  . Smoking status: Former Smoker    Packs/day: 0.20    Years: 5.00    Pack years: 1.00    Types: Cigarettes    Last attempt to quit: 11/01/1979    Years since quitting: 38.7  . Smokeless tobacco:  Never Used  Substance and Sexual Activity  . Alcohol use: No  . Drug use: No  . Sexual activity: Never  Lifestyle  . Physical activity:    Days per week: Not on file    Minutes per session: Not on file  . Stress: Not on file  Relationships  . Social connections:    Talks on phone: Not on file    Gets together: Not on file    Attends religious service: Not on file    Active member of club or organization: Not on file    Attends meetings of clubs or organizations: Not on file    Relationship status: Not on file  . Intimate partner violence:    Fear of current or ex partner: Not on file    Emotionally abused: Not on file    Physically abused: Not on file    Forced sexual activity: Not on file  Other Topics Concern  . Not on file  Social History Narrative  . Not on file    Outpatient Medications Prior to Visit  Medication Sig Dispense Refill  .  acetaZOLAMIDE (DIAMOX) 125 MG tablet Take 1 tablet (125 mg total) by mouth 3 (three) times daily. 30 tablet 0  . buPROPion (WELLBUTRIN XL) 150 MG 24 hr tablet 2 po qd 180 tablet 3  . fenofibrate 160 MG tablet Take 1 tablet (160 mg total) by mouth daily. 30 tablet 2  . ranitidine (ZANTAC) 150 MG tablet Take 150 mg by mouth 2 (two) times daily.    . busPIRone (BUSPAR) 10 MG tablet Take 1 tablet (10 mg total) by mouth 2 (two) times daily. 180 tablet 0  . busPIRone (BUSPAR) 10 MG tablet TAKE 1 TABLET BY MOUTH TWICE A DAY 60 tablet 0   No facility-administered medications prior to visit.     No Known Allergies  Review of Systems  Constitutional: Negative for fever and malaise/fatigue.  HENT: Negative for congestion.   Eyes: Negative for blurred vision.  Respiratory: Negative for cough and shortness of breath.   Cardiovascular: Negative for chest pain, palpitations and leg swelling.  Gastrointestinal: Negative for vomiting.  Musculoskeletal: Negative for back pain.  Skin: Negative for rash.  Neurological: Negative for loss of  consciousness and headaches.  Psychiatric/Behavioral: Positive for depression. Negative for memory loss and suicidal ideas. The patient is nervous/anxious. The patient does not have insomnia.        Objective:    Physical Exam  Constitutional: She is oriented to person, place, and time. She appears well-developed and well-nourished.  HENT:  Head: Normocephalic and atraumatic.  Eyes: Conjunctivae and EOM are normal.  Neck: Normal range of motion. Neck supple. No JVD present. Carotid bruit is not present. No thyromegaly present.  Cardiovascular: Normal rate, regular rhythm and normal heart sounds.  No murmur heard. Pulmonary/Chest: Effort normal and breath sounds normal. No respiratory distress. She has no wheezes. She has no rales. She exhibits no tenderness.  Musculoskeletal: She exhibits no edema.  Neurological: She is alert and oriented to person, place, and time.  Psychiatric: Her speech is normal and behavior is normal. Thought content normal. Her mood appears anxious. She exhibits a depressed mood.  Nursing note and vitals reviewed.   BP 118/64 (BP Location: Right Arm, Cuff Size: Normal)   Pulse 93   Temp 98.6 F (37 C) (Oral)   Resp 16   Ht 5' (1.524 m)   Wt 137 lb 6.4 oz (62.3 kg)   SpO2 98%   BMI 26.83 kg/m  Wt Readings from Last 3 Encounters:  07/22/18 137 lb 6.4 oz (62.3 kg)  03/26/18 141 lb 6.4 oz (64.1 kg)  02/18/18 143 lb 3.2 oz (65 kg)   BP Readings from Last 3 Encounters:  07/22/18 118/64  03/26/18 117/63  02/18/18 123/65     Immunization History  Administered Date(s) Administered  . Influenza,inj,Quad PF,6+ Mos 08/28/2016    Health Maintenance  Topic Date Due  . FOOT EXAM  01/07/1961  . DEXA SCAN  01/08/2016  . PNA vac Low Risk Adult (1 of 2 - PCV13) 01/08/2016  . OPHTHALMOLOGY EXAM  04/26/2017  . URINE MICROALBUMIN  10/31/2017  . HEMOGLOBIN A1C  06/05/2018  . INFLUENZA VACCINE  08/16/2018 (Originally 05/16/2018)  . COLONOSCOPY  03/27/2019  (Originally 09/23/2013)  . TETANUS/TDAP  03/27/2019 (Originally 01/07/1970)  . MAMMOGRAM  06/22/2019  . Hepatitis C Screening  Completed    Lab Results  Component Value Date   WBC 8.3 10/31/2016   HGB 15.1 (H) 10/31/2016   HCT 43.3 10/31/2016   PLT 245.0 10/31/2016   GLUCOSE 119 (H) 02/18/2018  CHOL 195 02/18/2018   TRIG (H) 02/18/2018    528.0 Triglyceride is over 400; calculations on Lipids are invalid.   HDL 35.10 (L) 02/18/2018   LDLDIRECT 99.0 02/18/2018   LDLCALC 95 12/21/2010   ALT 50 (H) 02/18/2018   AST 38 (H) 02/18/2018   NA 138 02/18/2018   K 4.0 02/18/2018   CL 101 02/18/2018   CREATININE 0.91 02/18/2018   BUN 15 02/18/2018   CO2 26 02/18/2018   TSH 2.30 10/31/2016   INR 1.0 03/11/2015   HGBA1C 6.3 12/06/2017   MICROALBUR 2.2 10/31/2016    Lab Results  Component Value Date   TSH 2.30 10/31/2016   Lab Results  Component Value Date   WBC 8.3 10/31/2016   HGB 15.1 (H) 10/31/2016   HCT 43.3 10/31/2016   MCV 84.3 10/31/2016   PLT 245.0 10/31/2016   Lab Results  Component Value Date   NA 138 02/18/2018   K 4.0 02/18/2018   CO2 26 02/18/2018   GLUCOSE 119 (H) 02/18/2018   BUN 15 02/18/2018   CREATININE 0.91 02/18/2018   BILITOT 0.4 02/18/2018   ALKPHOS 72 02/18/2018   AST 38 (H) 02/18/2018   ALT 50 (H) 02/18/2018   PROT 7.2 02/18/2018   ALBUMIN 4.3 02/18/2018   CALCIUM 10.1 02/18/2018   ANIONGAP 12 12/15/2014   GFR 65.51 02/18/2018   Lab Results  Component Value Date   CHOL 195 02/18/2018   Lab Results  Component Value Date   HDL 35.10 (L) 02/18/2018   Lab Results  Component Value Date   LDLCALC 95 12/21/2010   Lab Results  Component Value Date   TRIG (H) 02/18/2018    528.0 Triglyceride is over 400; calculations on Lipids are invalid.   Lab Results  Component Value Date   CHOLHDL 6 02/18/2018   Lab Results  Component Value Date   HGBA1C 6.3 12/06/2017         Assessment & Plan:   Problem List Items Addressed This Visit     None    Visit Diagnoses    Depression with anxiety    -  Primary   Relevant Medications   escitalopram (LEXAPRO) 10 MG tablet    con't wellburtrin  rto 4-6 weeks or sooner prn   I have discontinued Iliza Clack's busPIRone and busPIRone. I am also having her start on escitalopram. Additionally, I am having her maintain her ranitidine, buPROPion, fenofibrate, and acetaZOLAMIDE.  Meds ordered this encounter  Medications  . escitalopram (LEXAPRO) 10 MG tablet    Sig: Take 1 tablet (10 mg total) by mouth at bedtime.    Dispense:  30 tablet    Refill:  2    CMA served as scribe during this visit. History, Physical and Plan performed by medical provider. Documentation and orders reviewed and attested to.  Ann Held, DO

## 2018-07-22 NOTE — Patient Instructions (Signed)

## 2018-08-14 ENCOUNTER — Ambulatory Visit (INDEPENDENT_AMBULATORY_CARE_PROVIDER_SITE_OTHER): Payer: BLUE CROSS/BLUE SHIELD

## 2018-08-14 DIAGNOSIS — Z23 Encounter for immunization: Secondary | ICD-10-CM | POA: Diagnosis not present

## 2018-08-15 ENCOUNTER — Telehealth: Payer: Self-pay | Admitting: *Deleted

## 2018-08-15 ENCOUNTER — Encounter: Payer: Self-pay | Admitting: *Deleted

## 2018-08-15 NOTE — Telephone Encounter (Signed)
Left message on machine note is up front ready for pickup

## 2018-08-15 NOTE — Telephone Encounter (Signed)
Copied from [place] [phone]. Topic: General - Inquiry >> Aug 15, 2018  8:14 AM Margot Ables wrote: Reason for CRM: Pt turned in paper that she had flu shot 08/14/18 to her employer but it is not detailed enough. Employer is needing Lot #, Exp Date, and Manufacturer of flu vaccine. Please call.

## 2018-08-19 ENCOUNTER — Ambulatory Visit: Payer: BLUE CROSS/BLUE SHIELD

## 2018-08-30 ENCOUNTER — Ambulatory Visit: Payer: BLUE CROSS/BLUE SHIELD | Admitting: Medical

## 2018-09-02 ENCOUNTER — Ambulatory Visit: Payer: BLUE CROSS/BLUE SHIELD | Admitting: Medical

## 2018-09-03 ENCOUNTER — Encounter: Payer: Self-pay | Admitting: Medical

## 2018-09-05 DIAGNOSIS — M65332 Trigger finger, left middle finger: Secondary | ICD-10-CM | POA: Diagnosis not present

## 2018-09-17 ENCOUNTER — Other Ambulatory Visit: Payer: Self-pay | Admitting: Family Medicine

## 2018-09-17 ENCOUNTER — Telehealth: Payer: Self-pay | Admitting: Family Medicine

## 2018-09-17 DIAGNOSIS — F418 Other specified anxiety disorders: Secondary | ICD-10-CM

## 2018-09-17 MED ORDER — ESCITALOPRAM OXALATE 10 MG PO TABS
10.0000 mg | ORAL_TABLET | Freq: Every day | ORAL | 1 refills | Status: DC
Start: 2018-09-17 — End: 2018-12-03

## 2018-09-17 NOTE — Telephone Encounter (Signed)
Copied from [place] [phone]. Topic: Conservator, museum/gallery Patient (Clinic Use ONLY) >> Sep 17, 2018  7:58 AM Rosalin Hawking wrote: Reason for CRM:  Called pt to inform at the [facility] office we received a check of $50.00 with pt's name (with no explanation for what account does the check need to be credited on) Pt does not have a balance at the office.

## 2018-09-17 NOTE — Telephone Encounter (Signed)
Copied from [place] [phone]. Topic: Quick Communication - Rx Refill/Question >> Sep 17, 2018 11:49 AM Oneta Rack wrote:  Medication: 3 month supply escitalopram (LEXAPRO) 10 MG tablet    Has the patient contacted their pharmacy? Patient states new pharmacy therefore mail order requested patient to contact PCP    Preferred Pharmacy (with phone number or street name): [facility], [place] [phone] (Phone) [phone] (Fax)    Agent: Please be advised that RX refills may take up to 3 business days. We ask that you follow-up with your pharmacy.

## 2018-09-17 NOTE — Telephone Encounter (Signed)
Copied from [place] [phone]. Topic: Quick Communication - Office Called Patient (Clinic Use ONLY) >> Sep 17, 2018  7:57 AM Rosalin Hawking wrote: Reason for CRM:

## 2018-09-17 NOTE — Telephone Encounter (Signed)
Refills sent

## 2018-12-03 ENCOUNTER — Ambulatory Visit: Payer: BLUE CROSS/BLUE SHIELD | Admitting: Medical

## 2018-12-03 ENCOUNTER — Encounter: Payer: Self-pay | Admitting: Medical

## 2018-12-03 VITALS — BP 123/66 | HR 84 | Temp 98.1°F | Resp 16 | Ht 60.0 in | Wt 135.8 lb

## 2018-12-03 DIAGNOSIS — E559 Vitamin D deficiency, unspecified: Secondary | ICD-10-CM

## 2018-12-03 DIAGNOSIS — R739 Hyperglycemia, unspecified: Secondary | ICD-10-CM | POA: Diagnosis not present

## 2018-12-03 DIAGNOSIS — R1011 Right upper quadrant pain: Secondary | ICD-10-CM

## 2018-12-03 DIAGNOSIS — K219 Gastro-esophageal reflux disease without esophagitis: Secondary | ICD-10-CM

## 2018-12-03 DIAGNOSIS — R748 Abnormal levels of other serum enzymes: Secondary | ICD-10-CM | POA: Diagnosis not present

## 2018-12-03 DIAGNOSIS — R059 Cough, unspecified: Secondary | ICD-10-CM

## 2018-12-03 DIAGNOSIS — R05 Cough: Secondary | ICD-10-CM

## 2018-12-03 LAB — COMPREHENSIVE METABOLIC PANEL
ALBUMIN: 4.6 g/dL (ref 3.5–5.2)
ALT: 46 U/L — ABNORMAL HIGH (ref 0–35)
AST: 28 U/L (ref 0–37)
Alkaline Phosphatase: 95 U/L (ref 39–117)
BUN: 16 mg/dL (ref 6–23)
CALCIUM: 10.3 mg/dL (ref 8.4–10.5)
CHLORIDE: 101 meq/L (ref 96–112)
CO2: 27 meq/L (ref 19–32)
Creatinine, Ser: 0.93 mg/dL (ref 0.40–1.20)
GFR: 59.97 mL/min — AB (ref >60.00)
Glucose, Bld: 89 mg/dL (ref 70–99)
POTASSIUM: 4.4 meq/L (ref 3.5–5.1)
Sodium: 137 mEq/L (ref 135–145)
Total Bilirubin: 0.6 mg/dL (ref 0.2–1.2)
Total Protein: 7.1 g/dL (ref 6.0–8.3)

## 2018-12-03 LAB — HEMOGLOBIN A1C: HEMOGLOBIN A1C: 6.3 % (ref 4.6–6.5)

## 2018-12-03 NOTE — Progress Notes (Signed)
Subjective:    Patient ID: [name] [name], female    DOB: 1951/08/21, 68 y.o.   MRN: [id number]  HPI  Pt has had 3-4 weeks of intermittent cough. Random cough on and off that occurs about 2 days intermittently then goes away to come back randomly.  No pnd, no runny nose but some sneezing. Cough is minimally productive when she coughs. Pt works as RT. No fever, no chills or sweats.  Pt does remember if gets heart burn will cough a lot.  No fever except for 4 weeks ago felt feverish. No night time fever.  No wheezing. No pedal edema and no weight gain. No popliteal pain.  She has history of low vit d in past.  Hx of elevated liver enzymes. Hx of elevated sugar. History of fatty liver by [place] in 2017. Some pain after eating.   Review of Systems  Constitutional: Negative for chills, fatigue and fever.  HENT: Positive for sneezing. Negative for congestion, ear pain and facial swelling.   Respiratory: Positive for cough. Negative for chest tightness, shortness of breath and wheezing.   Cardiovascular: Negative for chest pain and palpitations.  Gastrointestinal: Negative for abdominal pain, constipation, diarrhea and nausea.       Rt upper quadrant pain after eating.  Some reflux after eating and gets cough.  Genitourinary: Negative for dysuria.  Musculoskeletal: Negative for back pain.  Skin: Negative for color change and rash.  Neurological: Negative for dizziness and headaches.  Hematological: Negative for adenopathy. Does not bruise/bleed easily.  Psychiatric/Behavioral: Negative for behavioral problems, confusion, sleep disturbance and suicidal ideas. The patient is not nervous/anxious.     Past Medical History:  Diagnosis Date  . Achalasia   . Anxiety and depression   . Diabetes type 2, controlled ([place]) 04/28/2015  . Diverticulosis   . Dysphagia   . GERD (gastroesophageal reflux disease)   . Hepatosplenomegaly   . Hyperlipidemia   . Status post dilation of esophageal  narrowing      Social History   Socioeconomic History  . Marital status: Married    Spouse name: Not on file  . Number of children: Not on file  . Years of education: Not on file  . Highest education level: Not on file  Occupational History  . Not on file  Social Needs  . Financial resource strain: Not on file  . Food insecurity:    Worry: Not on file    Inability: Not on file  . Transportation needs:    Medical: Not on file    Non-medical: Not on file  Tobacco Use  . Smoking status: Former Smoker    Packs/day: 0.20    Years: 5.00    Pack years: 1.00    Types: Cigarettes    Last attempt to quit: 11/01/1979    Years since quitting: 39.1  . Smokeless tobacco: Never Used  Substance and Sexual Activity  . Alcohol use: No  . Drug use: No  . Sexual activity: Never  Lifestyle  . Physical activity:    Days per week: Not on file    Minutes per session: Not on file  . Stress: Not on file  Relationships  . Social connections:    Talks on phone: Not on file    Gets together: Not on file    Attends religious service: Not on file    Active member of club or organization: Not on file    Attends meetings of clubs or organizations: Not on file  Relationship status: Not on file  . Intimate partner violence:    Fear of current or ex partner: Not on file    Emotionally abused: Not on file    Physically abused: Not on file    Forced sexual activity: Not on file  Other Topics Concern  . Not on file  Social History Narrative  . Not on file    Past Surgical History:  Procedure Laterality Date  . COLONOSCOPY    . ESOPHAGOGASTRODUODENOSCOPY    . LAPAROSCOPIC MYOTOMY      Family History  Problem Relation Age of Onset  . Hypertension Mother   . Heart disease Father   . Colon cancer Neg Hx   . Colon polyps Neg Hx   . Diabetes Neg Hx   . Kidney disease Neg Hx     No Known Allergies  Current Outpatient Medications on File Prior to Visit  Medication Sig Dispense Refill    . buPROPion (WELLBUTRIN XL) 150 MG 24 hr tablet 2 po qd 180 tablet 3   No current facility-administered medications on file prior to visit.     BP 123/66   Pulse 84   Temp 98.1 F (36.7 C) (Oral)   Resp 16   Ht 5' (1.524 m)   Wt 135 lb 12.8 oz (61.6 kg)   SpO2 97%   BMI 26.52 kg/m       Objective:   Physical Exam  General  Mental Status - Alert. General Appearance - Well groomed. Not in acute distress.  Skin Rashes- No Rashes.  HEENT Head- Normal. Ear Auditory Canal - Left- Normal. Right - Normal.Tympanic Membrane- Left- Normal. Right- Normal. Eye Sclera/Conjunctiva- Left- Normal. Right- Normal. Nose & Sinuses Nasal Mucosa- Left-  Boggy and Congested. Right-  Boggy and  Congested.Bilateral maxillary and frontal sinus pressure. Mouth & Throat Lips: Upper Lip- Normal: no dryness, cracking, pallor, cyanosis, or vesicular eruption. Lower Lip-Normal: no dryness, cracking, pallor, cyanosis or vesicular eruption. Buccal Mucosa- Bilateral- No Aphthous ulcers. Oropharynx- No Discharge or Erythema. Tonsils: Characteristics- Bilateral- No Erythema or Congestion. Size/Enlargement- Bilateral- No enlargement. Discharge- bilateral-None.  Neck Neck- Supple. No Masses.   Chest and Lung Exam Auscultation: Breath Sounds:-Clear even and unlabored.  Cardiovascular Auscultation:Rythm- Regular, rate and rhythm. Murmurs & Other Heart Sounds:Ausculatation of the heart reveal- No Murmurs.  Lymphatic Head & Neck General Head & Neck Lymphatics: Bilateral: Description- No Localized lymphadenopathy.   Abdomen Inspection:-Inspection Normal.  Palpation/Perucssion: Palpation and Percussion of the abdomen reveal- Non Tender, No Rebound tenderness, No rigidity(Guarding) and No Palpable abdominal masses.  Liver:-Normal.  Spleen:- Normal.  .      Assessment & Plan:  You had some intermittent cough over the past month.  On review of potential causes,  you did note association with  cough and heartburn/reflux.  So would recommend that you take omeprazole daily for at least 2 weeks to see if he can eliminate the cough.  If cough is still persisting intermittently then have placed future order chest x-ray to be done.  Also if cough persist would recommend that you consider getting over-the-counter antihistamine such as Xyzal and Flonase nasal spray as allergies are also potential cause of cough.  For history of vitamin D deficiency, did place vitamin D lab to be done today.  Imaging history of fatty liver, elevated liver enzymes and occasional right upper quadrant pain after eating.  Placed lab/CMP to be done today.  Also if you want you can go downstairs and get scheduled for abdomen  ultrasound to assess your liver for fatty liver and to evaluate gallbladder for disease.  You mentioned that you want to hold off presently and await for metabolic panel results/to see if your liver enzymes are increased.  Also did get A1c lab today to evaluate 64-month blood sugar average.  Follow-up in 2 to 3 weeks with PCP or myself.  As needed as well.  Mackie Pai, PA-C

## 2018-12-03 NOTE — Patient Instructions (Addendum)
You had some intermittent cough over the past month.  On review of potential causes,  you did note association with cough and heartburn/reflux.  So would recommend that you take omeprazole daily for at least 2 weeks to see if he can eliminate the cough.  If cough is still persisting intermittently then have placed future order chest x-ray to be done.  Also if cough persist would recommend that you consider getting over-the-counter antihistamine such as Xyzal and Flonase nasal spray as allergies are also potential cause of cough.  For history of vitamin D deficiency, did place vitamin D lab to be done today.  Imaging history of fatty liver, elevated liver enzymes and occasional right upper quadrant pain after eating.  Placed lab/CMP to be done today.  Also if you want you can go downstairs and get scheduled for abdomen ultrasound to assess your liver for fatty liver and to evaluate gallbladder for disease.  You mentioned that you want to hold off presently and await for metabolic panel results/to see if your liver enzymes are increased.  Also did get A1c today to evaluate 48-month blood sugar average.  Follow-up in 2 to 3 weeks with PCP or myself.  As needed as well.

## 2018-12-05 DIAGNOSIS — M65332 Trigger finger, left middle finger: Secondary | ICD-10-CM | POA: Diagnosis not present

## 2018-12-06 LAB — VITAMIN D 1,25 DIHYDROXY
VITAMIN D2 1, 25 (OH): 10 pg/mL
Vitamin D 1, 25 (OH)2 Total: 90 pg/mL — ABNORMAL HIGH (ref 18–72)
Vitamin D3 1, 25 (OH)2: 80 pg/mL

## 2018-12-19 ENCOUNTER — Other Ambulatory Visit (HOSPITAL_BASED_OUTPATIENT_CLINIC_OR_DEPARTMENT_OTHER): Payer: BLUE CROSS/BLUE SHIELD

## 2018-12-19 ENCOUNTER — Ambulatory Visit (HOSPITAL_BASED_OUTPATIENT_CLINIC_OR_DEPARTMENT_OTHER): Payer: BLUE CROSS/BLUE SHIELD

## 2018-12-25 ENCOUNTER — Other Ambulatory Visit: Payer: Self-pay

## 2018-12-25 ENCOUNTER — Ambulatory Visit (HOSPITAL_BASED_OUTPATIENT_CLINIC_OR_DEPARTMENT_OTHER)
Admission: RE | Admit: 2018-12-25 | Discharge: 2018-12-25 | Disposition: A | Discharge status: Home / Self Care | Payer: BLUE CROSS/BLUE SHIELD | Source: Ambulatory Visit | Attending: Medical | Admitting: Medical

## 2018-12-25 DIAGNOSIS — R1011 Right upper quadrant pain: Secondary | ICD-10-CM

## 2018-12-25 DIAGNOSIS — R059 Cough, unspecified: Secondary | ICD-10-CM

## 2018-12-25 DIAGNOSIS — K7689 Other specified diseases of liver: Secondary | ICD-10-CM | POA: Diagnosis not present

## 2018-12-25 DIAGNOSIS — R05 Cough: Secondary | ICD-10-CM | POA: Insufficient documentation

## 2019-02-20 DIAGNOSIS — L820 Inflamed seborrheic keratosis: Secondary | ICD-10-CM | POA: Diagnosis not present

## 2019-03-10 ENCOUNTER — Other Ambulatory Visit: Payer: Self-pay | Admitting: Family Medicine

## 2019-03-10 DIAGNOSIS — F321 Major depressive disorder, single episode, moderate: Secondary | ICD-10-CM

## 2019-03-13 ENCOUNTER — Telehealth: Payer: Self-pay

## 2019-03-13 NOTE — Telephone Encounter (Signed)
Copied from [place] [phone]. Topic: Appointment Scheduling - Scheduling Inquiry for Clinic >> Mar 12, 2019  4:43 PM Mathis Bud wrote: Reason for CRM: Patient called to schedule appt for medication refill after buPROPion is out .Patient called after hours. Patient call back # [phone]

## 2019-03-13 NOTE — Telephone Encounter (Signed)
Called pt and no answer.  

## 2019-04-01 ENCOUNTER — Ambulatory Visit: Payer: BLUE CROSS/BLUE SHIELD | Admitting: Internal Medicine

## 2019-04-07 ENCOUNTER — Encounter: Payer: Self-pay | Admitting: Internal Medicine

## 2019-04-07 ENCOUNTER — Other Ambulatory Visit: Payer: Self-pay

## 2019-04-07 ENCOUNTER — Ambulatory Visit (INDEPENDENT_AMBULATORY_CARE_PROVIDER_SITE_OTHER): Payer: BC Managed Care – PPO | Admitting: Internal Medicine

## 2019-04-07 ENCOUNTER — Encounter: Payer: Self-pay | Admitting: Family Medicine

## 2019-04-07 VITALS — Ht 60.0 in | Wt 135.0 lb

## 2019-04-07 DIAGNOSIS — Z1211 Encounter for screening for malignant neoplasm of colon: Secondary | ICD-10-CM | POA: Diagnosis not present

## 2019-04-07 DIAGNOSIS — K220 Achalasia of cardia: Secondary | ICD-10-CM

## 2019-04-07 DIAGNOSIS — R1319 Other dysphagia: Secondary | ICD-10-CM

## 2019-04-07 DIAGNOSIS — R1310 Dysphagia, unspecified: Secondary | ICD-10-CM

## 2019-04-07 DIAGNOSIS — F419 Anxiety disorder, unspecified: Secondary | ICD-10-CM

## 2019-04-07 NOTE — Telephone Encounter (Signed)
Is pt having palpitations?   Dr Lorayne Bender mentioned lowering wellbutrin because it can make anxiety worse and make the heart race ---prozac normally does not Now that she is on the 40 mg prozac-- she may want to try just taking 1 wellbutrin -- she may actually feel better

## 2019-04-07 NOTE — Progress Notes (Signed)
TELEHEALTH ENCOUNTER IN SETTING OF COVID-19 PANDEMIC - REQUESTED BY PATIENT SERVICE PROVIDED BY TELEMEDECINE - TYPE: telephone PATIENT LOCATION: Home PATIENT HAS CONSENTED TO TELEHEALTH VISIT PROVIDER LOCATION: OFFICE REFERRING PROVIDER:Lowne Koren Shiver, DO is PCP PARTICIPANTS OTHER THAN PATIENT: None TIME SPENT ON CALL:16 mins    [name] [name] 69 y.o. 1951/05/27 [id number]  Assessment & Plan:   Encounter Diagnoses  Name Primary?   Esophageal dysphagia Yes   Achalasia s/p Heller Myotomy 2005    Colon cancer screening    Anxiety     Plan for EGD and dilation as before since it provided benefit.  Screening colonoscopy will be scheduled.  The risks and benefits as well as alternatives of endoscopic procedure(s) have been discussed and reviewed. All questions answered. The patient agrees to proceed. She is aware and accepts that it is possible she could contract coronavirus SARS-CoV-2 i.e. COVID-19 illness through this interaction.  Communicate with Dr. Cheri [name] that she is now on fluoxetine and bupropion, could need dose adjustment of bupropion.  I appreciate the opportunity to care for this patient. CC: [name] Held, DO    Subjective:   Chief Complaint: Dysphagia  HPI [name] [name] is a 68 year old married woman originally from [place] with a history of achalasia, laparoscopic Heller myotomy and fundoplication procedure 2440, who is had some recurrent dysphagia.  She has been having solid food dysphagia again with a suprasternal sticking point.  Chicken breast is an example of a problem food.  Her weight went down and then came back up.  She has been quite anxious and nervous about the COVID pandemic.  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  She has been on  bupropion but added some fluoxetine that she had at home from a previous prescription and says that has been helping quite a bit.  Dr. Cheri [name] is not yet aware of this.  I encouraged her to message Dr. Cheri [name] about this.  She last had a balloon dilation at the GE junction, it looks like her fundoplication had slipped a little bit, in July 2018 and had a good result from that for well over a year she says.  She has not had a colonoscopy since 2004.  She has had some Hemoccult testing throughout the years the last of which was in 2017.  It was negative.  She is ready to have a colonoscopy.  She is a little bit nervous about doing both procedures at once.  No Known Allergies Current Meds  Medication Sig   buPROPion (WELLBUTRIN XL) 150 MG 24 hr tablet TAKE 2 TABLETS DAILY   FLUoxetine (PROZAC) 40 MG capsule Take 40 mg by mouth at bedtime.   Past Medical History:  Diagnosis Date   Achalasia    Anxiety and depression    Diabetes type 2, controlled ([place]) 04/28/2015   Diverticulosis    Dysphagia    GERD (gastroesophageal reflux disease)    Hepatosplenomegaly    Hyperlipidemia    Status post dilation of esophageal narrowing    Past Surgical History:  Procedure Laterality Date   COLONOSCOPY     ESOPHAGOGASTRODUODENOSCOPY     LAPAROSCOPIC MYOTOMY     Social History   Social History Narrative   Married, respiratory therapist at [employer]   Former smoker no  alcohol or drug use   1 child   family history includes Heart disease in her father; Hypertension in her mother.   Review of Systems As per HPI   Wt Readings from Last 3 Encounters:  04/07/19 135 lb (61.2 kg)  12/03/18 135 lb 12.8 oz (61.6 kg)  07/22/18 137 lb 6.4 oz (62.3 kg)

## 2019-04-07 NOTE — Patient Instructions (Signed)
It was nice to speak with you today.  I am glad your anxiety is improved with the addition of the Prozac but please remember to discuss this or communicate with Dr. Cheri Rous about adding that back to your regimen.   My staff will arrange for you to have an upper endoscopy with balloon dilation to evaluate and treat the dysphagia, and a screening colonoscopy on the same day as we discussed.  We will use the MiraLAX prep as well.  You may have a light breakfast the day before the procedure whether it is done in the morning or the afternoon.  Something like scrambled eggs or other soft foods would be fine, you then should start liquids per our protocol after that by 11:00 that morning.   I appreciate the opportunity to care for you. Gatha Mayer, MD, [facility]

## 2019-04-14 ENCOUNTER — Telehealth: Payer: Self-pay

## 2019-04-14 DIAGNOSIS — Z1211 Encounter for screening for malignant neoplasm of colon: Secondary | ICD-10-CM

## 2019-04-14 DIAGNOSIS — R1310 Dysphagia, unspecified: Secondary | ICD-10-CM

## 2019-04-14 DIAGNOSIS — R1319 Other dysphagia: Secondary | ICD-10-CM

## 2019-04-14 NOTE — Telephone Encounter (Signed)
As I was unable to reach her after her Telephone visit with Dr Carlean Purl on 04/07/2019 I have called and left a detailed message to call us back to set up her ECL in the [place]. With Dilation and she may have a light breakfast on her prep day no matter what time her ECL appointment is per Dr Carlean Purl.

## 2019-04-15 NOTE — Telephone Encounter (Signed)
Spoke with [name] and booked 05/21/2019 for her ECL. Confirmed address and mailing her instructions. She will call me with any questions.

## 2019-04-21 ENCOUNTER — Encounter: Payer: BC Managed Care – PPO | Admitting: Internal Medicine

## 2019-04-28 ENCOUNTER — Telehealth: Payer: Self-pay | Admitting: Internal Medicine

## 2019-04-28 DIAGNOSIS — R1319 Other dysphagia: Secondary | ICD-10-CM

## 2019-04-28 DIAGNOSIS — Z1211 Encounter for screening for malignant neoplasm of colon: Secondary | ICD-10-CM

## 2019-04-28 DIAGNOSIS — R1310 Dysphagia, unspecified: Secondary | ICD-10-CM

## 2019-04-28 NOTE — Telephone Encounter (Signed)
Spoke with [name] [name] and she is too anxious to do her procedures together. I will retype and she is going to look in [facility] to get instructions. I will mail her the pre-procedure patient acknowledgement forms to bring back to her appointments.

## 2019-04-28 NOTE — Telephone Encounter (Signed)
Pt requested to have colonoscopy and EGD separately and requested new instructions sent through [place].

## 2019-05-07 ENCOUNTER — Encounter: Payer: BC Managed Care – PPO | Admitting: Internal Medicine

## 2019-05-20 ENCOUNTER — Telehealth: Payer: Self-pay | Admitting: Internal Medicine

## 2019-05-20 NOTE — Telephone Encounter (Signed)
Spoke with patient and she answered "NO" to all the Covid-19 questions

## 2019-05-20 NOTE — Telephone Encounter (Signed)

## 2019-05-21 ENCOUNTER — Ambulatory Visit (AMBULATORY_SURGERY_CENTER): Payer: BC Managed Care – PPO | Admitting: Internal Medicine

## 2019-05-21 ENCOUNTER — Encounter: Payer: Self-pay | Admitting: Internal Medicine

## 2019-05-21 ENCOUNTER — Encounter: Payer: BC Managed Care – PPO | Admitting: Internal Medicine

## 2019-05-21 ENCOUNTER — Other Ambulatory Visit: Payer: Self-pay

## 2019-05-21 VITALS — BP 107/73 | HR 111 | Temp 98.0°F | Resp 13 | Ht 60.0 in | Wt 135.0 lb

## 2019-05-21 DIAGNOSIS — K222 Esophageal obstruction: Secondary | ICD-10-CM

## 2019-05-21 DIAGNOSIS — R1310 Dysphagia, unspecified: Secondary | ICD-10-CM | POA: Diagnosis not present

## 2019-05-21 DIAGNOSIS — R1319 Other dysphagia: Secondary | ICD-10-CM

## 2019-05-21 MED ORDER — SODIUM CHLORIDE 0.9 % IV SOLN: 500.0000 mL | Freq: Once | INTRAVENOUS | Status: DC

## 2019-05-21 NOTE — Op Note (Signed)
[place] [facility] Patient Name: [name] [name] Procedure Date: 05/21/2019 7:57 AM MRN: [id number] Endoscopist: Gatha Mayer , MD Age: 68 Referring MD:  Date of Birth: 15-Jun-1951 Gender: Female Account #: [id number] Procedure:                Upper GI endoscopy Indications:              Dysphagia, Achalasia, Follow-up of achalasia Medicines:                Propofol per Anesthesia, Monitored Anesthesia Care Procedure:                Pre-Anesthesia Assessment:                           - Prior to the procedure, a History and Physical                            was performed, and patient medications and                            allergies were reviewed. The patient's tolerance of                            previous anesthesia was also reviewed. The risks                            and benefits of the procedure and the sedation                            options and risks were discussed with the patient.                            All questions were answered, and informed consent                            was obtained. Prior Anticoagulants: The patient has                            taken no previous anticoagulant or antiplatelet                            agents. ASA Grade Assessment: II - A patient with                            mild systemic disease. After reviewing the risks                            and benefits, the patient was deemed in                            satisfactory condition to undergo the procedure.                           After obtaining informed consent, the endoscope was  passed under direct vision. Throughout the                            procedure, the patient's blood pressure, pulse, and                            oxygen saturations were monitored continuously. The                            Endoscope was introduced through the mouth, and                            advanced to the second part of duodenum. The upper             GI endoscopy was accomplished without difficulty.                            The patient tolerated the procedure well. Scope In: Scope Out: Findings:                 The lumen of the esophagus was moderately dilated.                            No retained fluid but some foamy saliva - cleared.                           One moderate stenosis was found at the                            gastroesophageal junction. The stenosis was                            traversed. A TTS dilator was passed through the                            scope. Dilation with an 18-19-20 mm balloon dilator                            was performed to 20 mm. Estimated blood loss was                            minimal.                           Evidence of a partial fundoplication was found in                            the cardia.                           The exam was otherwise without abnormality.                           The cardia and gastric fundus were normal on  retroflexion. Complications:            No immediate complications. Estimated Blood Loss:     Estimated blood loss was minimal. Impression:               - Dilation in the entire esophagus.                           - Esophageal stenosis. Dilated. Slight mucosal                            disruption afte 20 mm dilation                           - A partial fundoplication was found.                           - The examination was otherwise normal.                           - No specimens collected. Recommendation:           - Patient has a contact number available for                            emergencies. The signs and symptoms of potential                            delayed complications were discussed with the                            patient. Return to normal activities tomorrow.                            Written discharge instructions were provided to the                            patient.                            - Clear liquids x 1 hour then soft foods rest of                            day. Start prior diet tomorrow.                           - Continue present medications.                           - SHE IS TO SEE HOW IT GOES FOR A FEW WEEKS AND                            CONTACT ME ABOUT EFFECTS OF DILATION                           SHOULD CUTR FOOD SMALL AND CHEW WELL, EAT SLOWLY  AND DRINK PLENTY OF LIQUID WITH MEALS. Gatha Mayer, MD 05/21/2019 8:25:21 AM This report has been signed electronically.

## 2019-05-21 NOTE — Progress Notes (Signed)
Called to room to assist during endoscopic procedure.  Patient ID and intended procedure confirmed with present staff. Received instructions for my participation in the procedure from the performing physician.  

## 2019-05-21 NOTE — Patient Instructions (Addendum)
NO CANCER SEEN!  It was narrowed at the esophageal-gastric junction and I dilated it to 20 mm.  Let us see if that helps.  Please giove it a few weeks and see how its going - and let me know by My Chart or phone call.  Sometimes we might need to do a different dilation.  Please be sure to cut food small, chew well and eat slowly, with liquids to flush things down.  I appreciate the opportunity to care for you.  Gatha Mayer, MD, FACG   YOU HAD AN ENDOSCOPIC PROCEDURE TODAY AT [facility]:   Refer to the procedure report that was given to you for any specific questions about what was found during the examination.  If the procedure report does not answer your questions, please call your gastroenterologist to clarify.  If you requested that your care partner not be given the details of your procedure findings, then the procedure report has been included in a sealed envelope for you to review at your convenience later.  YOU SHOULD EXPECT: Some feelings of bloating in the abdomen. Passage of more gas than usual.  Walking can help get rid of the air that was put into your GI tract during the procedure and reduce the bloating. If you had a lower endoscopy (such as a colonoscopy or flexible sigmoidoscopy) you may notice spotting of blood in your stool or on the toilet paper. If you underwent a bowel prep for your procedure, you may not have a normal bowel movement for a few days.  Please Note:  You might notice some irritation and congestion in your nose or some drainage.  This is from the oxygen used during your procedure.  There is no need for concern and it should clear up in a day or so.  SYMPTOMS TO REPORT IMMEDIATELY:    Following upper endoscopy (EGD)  Vomiting of blood or coffee ground material  New chest pain or pain under the shoulder blades  Painful or persistently difficult swallowing  New shortness of breath  Fever of 100F or higher  Black,  tarry-looking stools  For urgent or emergent issues, a gastroenterologist can be reached at any hour by calling [phone].   DIET:  We do recommend a small meal at first, but then you may proceed to your regular diet.  Drink plenty of fluids but you should avoid alcoholic beverages for 24 hours.  ACTIVITY:  You should plan to take it easy for the rest of today and you should NOT DRIVE or use heavy machinery until tomorrow (because of the sedation medicines used during the test).    FOLLOW UP: Our staff will call the number listed on your records 48-72 hours following your procedure to check on you and address any questions or concerns that you may have regarding the information given to you following your procedure. If we do not reach you, we will leave a message.  We will attempt to reach you two times.  During this call, we will ask if you have developed any symptoms of COVID 19. If you develop any symptoms (ie: fever, flu-like symptoms, shortness of breath, cough etc.) before then, please call [phone].  If you test positive for Covid 19 in the 2 weeks post procedure, please call and report this information to [place].    If any biopsies were taken you will be contacted by phone or by letter within the next 1-3 weeks.  Please call us at [phone] if you have not heard about the biopsies in 3 weeks.    SIGNATURES/CONFIDENTIALITY: You and/or your care partner have signed paperwork which will be entered into your electronic medical record.  These signatures attest to the fact that that the information above on your After Visit Summary has been reviewed and is understood.  Full responsibility of the confidentiality of this discharge information lies with you and/or your care-partner.

## 2019-05-21 NOTE — Progress Notes (Signed)
JB- Temp CW- Vitals 

## 2019-05-21 NOTE — Progress Notes (Signed)
To PACU< VSS. Report to [email or web address] 

## 2019-05-23 ENCOUNTER — Telehealth: Payer: Self-pay

## 2019-05-23 NOTE — Telephone Encounter (Signed)
Second follow up phone call attempt, no answer, LM 

## 2019-05-28 ENCOUNTER — Telehealth: Payer: Self-pay | Admitting: Internal Medicine

## 2019-05-28 NOTE — Telephone Encounter (Signed)
I informed patient that I've sent her the new instructions for her colon thru [facility] and to call me with any questions once she reads over it.

## 2019-05-28 NOTE — Telephone Encounter (Signed)
Pt's colon has been rescheduled to 8/19 and pt requested new instructions updated in [place].

## 2019-06-03 ENCOUNTER — Telehealth: Payer: Self-pay

## 2019-06-03 NOTE — Telephone Encounter (Signed)
Covid-19 screening questions   Do you now or have you had a fever in the last 14 days? No  Do you have any respiratory symptoms of shortness of breath or cough now or in the last 14 days? No  Do you have any family members or close contacts with diagnosed or suspected Covid-19 in the past 14 days? No  Have you been tested for Covid-19 and found to be positive? No      Per patient was tested for Covid. Results were negative.

## 2019-06-04 ENCOUNTER — Other Ambulatory Visit: Payer: Self-pay

## 2019-06-04 ENCOUNTER — Ambulatory Visit (AMBULATORY_SURGERY_CENTER): Payer: BC Managed Care – PPO | Admitting: Internal Medicine

## 2019-06-04 ENCOUNTER — Encounter: Payer: Self-pay | Admitting: Internal Medicine

## 2019-06-04 VITALS — BP 121/68 | HR 75 | Temp 96.4°F | Resp 16 | Ht 60.0 in | Wt 135.0 lb

## 2019-06-04 DIAGNOSIS — Z1211 Encounter for screening for malignant neoplasm of colon: Secondary | ICD-10-CM | POA: Diagnosis not present

## 2019-06-04 DIAGNOSIS — D122 Benign neoplasm of ascending colon: Secondary | ICD-10-CM | POA: Diagnosis not present

## 2019-06-04 MED ORDER — SODIUM CHLORIDE 0.9 % IV SOLN: 500.0000 mL | Freq: Once | INTRAVENOUS | Status: DC

## 2019-06-04 NOTE — Progress Notes (Signed)
Called to room to assist during endoscopic procedure.  Patient ID and intended procedure confirmed with present staff. Received instructions for my participation in the procedure from the performing physician.  

## 2019-06-04 NOTE — Op Note (Signed)
[place] [facility] Patient Name: [name] [name] Procedure Date: 06/04/2019 10:44 AM MRN: [id number] Endoscopist: Gatha Mayer , MD Age: 68 Referring MD:  Date of Birth: 16-Nov-1950 Gender: Female Account #: [id number] Procedure:                Colonoscopy Indications:              Screening for colorectal malignant neoplasm Medicines:                Propofol per Anesthesia, Monitored Anesthesia Care Procedure:                Pre-Anesthesia Assessment:                           - Prior to the procedure, a History and Physical                            was performed, and patient medications and                            allergies were reviewed. The patient's tolerance of                            previous anesthesia was also reviewed. The risks                            and benefits of the procedure and the sedation                            options and risks were discussed with the patient.                            All questions were answered, and informed consent                            was obtained. Prior Anticoagulants: The patient has                            taken no previous anticoagulant or antiplatelet                            agents. ASA Grade Assessment: II - A patient with                            mild systemic disease. After reviewing the risks                            and benefits, the patient was deemed in                            satisfactory condition to undergo the procedure.                           After obtaining informed consent, the colonoscope  was passed under direct vision. Throughout the                            procedure, the patient's blood pressure, pulse, and                            oxygen saturations were monitored continuously. The                            Colonoscope was introduced through the anus and                            advanced to the the cecum, identified by   appendiceal orifice and ileocecal valve. The                            colonoscopy was performed without difficulty. The                            patient tolerated the procedure well. The quality                            of the bowel preparation was excellent. The                            ileocecal valve, appendiceal orifice, and rectum                            were photographed. The bowel preparation used was                            Miralax via split dose instruction. Scope In: 10:51:35 AM Scope Out: 11:07:19 AM Scope Withdrawal Time: 0 hours 12 minutes 0 seconds  Total Procedure Duration: 0 hours 15 minutes 44 seconds  Findings:                 The perianal and digital rectal examinations were                            normal.                           Two sessile polyps were found in the ascending                            colon. The polyps were 3 to 8 mm in size. These                            polyps were removed with a cold snare. Resection                            and retrieval were complete. Verification of                            patient identification for  the specimen was done.                            Estimated blood loss was minimal.                           Multiple diverticula were found in the sigmoid                            colon.                           The exam was otherwise without abnormality on                            direct and retroflexion views. Complications:            No immediate complications. Estimated Blood Loss:     Estimated blood loss was minimal. Impression:               - Two 3 to 8 mm polyps in the ascending colon,                            removed with a cold snare. Resected and retrieved.                           - Diverticulosis in the sigmoid colon.                           - The examination was otherwise normal on direct                            and retroflexion views. Recommendation:           - Patient has  a contact number available for                            emergencies. The signs and symptoms of potential                            delayed complications were discussed with the                            patient. Return to normal activities tomorrow.                            Written discharge instructions were provided to the                            patient.                           - Resume previous diet.                           - Continue present medications.                           -  Repeat colonoscopy is recommended. The                            colonoscopy date will be determined after pathology                            results from today's exam become available for                            review. Gatha Mayer, MD 06/04/2019 11:14:28 AM This report has been signed electronically.

## 2019-06-04 NOTE — Progress Notes (Signed)
Temp per June Bullock, vs per [facility].Pt's states no medical or surgical changes since previsit or office visit.

## 2019-06-04 NOTE — Progress Notes (Signed)
PT taken to PACU. Monitors in place. VSS. Report given to RN. 

## 2019-06-04 NOTE — Patient Instructions (Addendum)
I found and removed 2 small polyps. Both look benign.  I will let you know pathology results and when to have another routine colonoscopy by mail and/or My Chart.  You also have a condition called diverticulosis - common and not usually a problem. Please read the handout provided.   I think you should set up a follow-up visit with me regarding the swallowing.  I appreciate the opportunity to care for you. Gatha Mayer, MD, FACG    INFORMATION ON POLYPS AND DIVERTICULOSIS GIVEN TO YOU TODAY   YOU HAD AN ENDOSCOPIC PROCEDURE TODAY AT [facility]:   Refer to the procedure report that was given to you for any specific questions about what was found during the examination.  If the procedure report does not answer your questions, please call your gastroenterologist to clarify.  If you requested that your care partner not be given the details of your procedure findings, then the procedure report has been included in a sealed envelope for you to review at your convenience later.  YOU SHOULD EXPECT: Some feelings of bloating in the abdomen. Passage of more gas than usual.  Walking can help get rid of the air that was put into your GI tract during the procedure and reduce the bloating. If you had a lower endoscopy (such as a colonoscopy or flexible sigmoidoscopy) you may notice spotting of blood in your stool or on the toilet paper. If you underwent a bowel prep for your procedure, you may not have a normal bowel movement for a few days.  Please Note:  You might notice some irritation and congestion in your nose or some drainage.  This is from the oxygen used during your procedure.  There is no need for concern and it should clear up in a day or so.  SYMPTOMS TO REPORT IMMEDIATELY:   Following lower endoscopy (colonoscopy or flexible sigmoidoscopy):  Excessive amounts of blood in the stool  Significant tenderness or worsening of abdominal pains  Swelling of the abdomen  that is new, acute  Fever of 100F or higher   For urgent or emergent issues, a gastroenterologist can be reached at any hour by calling [phone].   DIET:  We do recommend a small meal at first, but then you may proceed to your regular diet.  Drink plenty of fluids but you should avoid alcoholic beverages for 24 hours.  ACTIVITY:  You should plan to take it easy for the rest of today and you should NOT DRIVE or use heavy machinery until tomorrow (because of the sedation medicines used during the test).    FOLLOW UP: Our staff will call the number listed on your records 48-72 hours following your procedure to check on you and address any questions or concerns that you may have regarding the information given to you following your procedure. If we do not reach you, we will leave a message.  We will attempt to reach you two times.  During this call, we will ask if you have developed any symptoms of COVID 19. If you develop any symptoms (ie: fever, flu-like symptoms, shortness of breath, cough etc.) before then, please call [phone].  If you test positive for Covid 19 in the 2 weeks post procedure, please call and report this information to [place].    If any biopsies were taken you will be contacted by phone or by letter within the next 1-3 weeks.  Please call us at [phone] if you have  not heard about the biopsies in 3 weeks.    SIGNATURES/CONFIDENTIALITY: You and/or your care partner have signed paperwork which will be entered into your electronic medical record.  These signatures attest to the fact that that the information above on your After Visit Summary has been reviewed and is understood.  Full responsibility of the confidentiality of this discharge information lies with you and/or your care-partner.

## 2019-06-06 ENCOUNTER — Telehealth: Payer: Self-pay

## 2019-06-06 NOTE — Telephone Encounter (Signed)
Attempted to reach patient for post-procedure f/u call. No answer. This is the 2nd attempt. Left message for her to please not hesitate to call us if she has any questions/concerns regarding her care.

## 2019-06-06 NOTE — Telephone Encounter (Signed)
Follow up call with pt, left message for pt to call if any problems, otherwise we will call back after noon today.

## 2019-06-07 ENCOUNTER — Other Ambulatory Visit: Payer: Self-pay | Admitting: Family Medicine

## 2019-06-07 DIAGNOSIS — F321 Major depressive disorder, single episode, moderate: Secondary | ICD-10-CM

## 2019-06-13 ENCOUNTER — Encounter: Payer: Self-pay | Admitting: Internal Medicine

## 2019-06-13 DIAGNOSIS — Z86010 Personal history of colonic polyps: Secondary | ICD-10-CM

## 2019-06-13 DIAGNOSIS — Z860101 Personal history of adenomatous and serrated colon polyps: Secondary | ICD-10-CM

## 2019-06-13 HISTORY — DX: Personal history of adenomatous and serrated colon polyps: Z86.0101

## 2019-06-13 HISTORY — DX: Personal history of colonic polyps: Z86.010

## 2019-06-13 NOTE — Progress Notes (Signed)
2 adenomas max 8 mm Recall 7 yrs My Chart

## 2019-08-01 ENCOUNTER — Ambulatory Visit (HOSPITAL_BASED_OUTPATIENT_CLINIC_OR_DEPARTMENT_OTHER)
Admission: RE | Admit: 2019-08-01 | Discharge: 2019-08-01 | Disposition: A | Discharge status: Home / Self Care | Payer: Medicare Other | Source: Ambulatory Visit | Attending: Medical | Admitting: Medical

## 2019-08-01 ENCOUNTER — Ambulatory Visit (INDEPENDENT_AMBULATORY_CARE_PROVIDER_SITE_OTHER): Payer: Medicare Other | Admitting: Medical

## 2019-08-01 ENCOUNTER — Ambulatory Visit: Payer: BC Managed Care – PPO | Admitting: Internal Medicine

## 2019-08-01 ENCOUNTER — Encounter: Payer: Self-pay | Admitting: Medical

## 2019-08-01 ENCOUNTER — Other Ambulatory Visit: Payer: Self-pay

## 2019-08-01 VITALS — BP 128/66 | HR 78 | Temp 97.4°F | Resp 16 | Ht 60.0 in | Wt 130.8 lb

## 2019-08-01 DIAGNOSIS — M25561 Pain in right knee: Secondary | ICD-10-CM | POA: Diagnosis present

## 2019-08-01 MED ORDER — TRAMADOL HCL 50 MG PO TABS: ORAL_TABLET | ORAL | 0 refills | Status: DC

## 2019-08-01 NOTE — Progress Notes (Signed)
Subjective:    Patient ID: [name] [name], female    DOB: [birth year], 68 y.o.   MRN: [id number]  HPI  Patient with knee pain for 10 days.  She denies injury or fall. She reports the pain gradually came on and she first really noticed it about 10 days ago. About 5 years ago she had similar pain in left pain, but she cannot remember her diagnoses. States it went away on its own.   Pain is to medial right knee. She has been taking Advil regularly for the past few days ane reports some improvement. She picked up voltaren gel this morning but has not tried it yet. At rest pain is 3/10 aching, with walking/standing pain is 10/10 sharp. States she can barely walk unless she is bracing herself against something. Full range of motion. Denies swelling and bruising. Reports mild stretching (flexion/extension) while resting makes it feel better. She has been applying tumeric and compression wrap.   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  Pt has heartburn and states if takes nsaids will have burning. No hx of ulcers. Pt has omeprazole that she uses for occasional reflux.  Pt states pain exceeds level 10 when she walks.   Review of Systems  Constitutional: Negative for chills, fatigue and fever.  Respiratory: Negative for chest tightness.   Cardiovascular: Negative for chest pain and palpitations.  Gastrointestinal: Negative for abdominal pain, constipation, diarrhea and nausea.  Musculoskeletal: Negative for back pain and myalgias.       Right medial knee pain 10/10 with standing/walking for the past 10 days  Skin: Negative for rash.  Neurological: Negative for dizziness, weakness, numbness and headaches.  Hematological: Negative for adenopathy. Does not bruise/bleed easily.  Psychiatric/Behavioral: Negative for behavioral problems and confusion. The patient is not nervous/anxious.     Past Medical History:  Diagnosis Date  . Achalasia   . Anxiety and depression   . Diabetes  type 2, controlled ([facility]) 04/28/2015  . Diverticulosis   . Dysphagia   . GERD (gastroesophageal reflux disease)   . Hepatosplenomegaly   . Hx of adenomatous colonic polyps 06/13/2019  . Hyperlipidemia   . Status post dilation of esophageal narrowing      Social History   Socioeconomic History  . Marital status: Married    Spouse name: Not on file  . Number of children: 1  . Years of education: Not on file  . Highest education level: Not on file  Occupational History  . Occupation: Microbiologist: [employer]  Social Needs  . Financial resource strain: Not on file  . Food insecurity    Worry: Not on file    Inability: Not on file  . Transportation needs    Medical: Not on file    Non-medical: Not on file  Tobacco Use  . Smoking status: Former Smoker    Packs/day: 0.20    Years: 5.00    Pack years: 1.00    Types: Cigarettes    Quit date: 11/01/1979    Years since quitting: 39.7  . Smokeless tobacco: Never Used  Substance and Sexual Activity  . Alcohol use: No  . Drug use: No  . Sexual activity: Not on file  Lifestyle  . Physical activity    Days per week: Not on file    Minutes per session: Not on file  . Stress: Not on file  Relationships  . Social connections  Talks on phone: Not on file    Gets together: Not on file    Attends religious service: Not on file    Active member of club or organization: Not on file    Attends meetings of clubs or organizations: Not on file    Relationship status: Not on file  . Intimate partner violence    Fear of current or ex partner: Not on file    Emotionally abused: Not on file    Physically abused: Not on file    Forced sexual activity: Not on file  Other Topics Concern  . Not on file  Social History Narrative   Married, respiratory therapist at [employer]   She is a [place]   Former smoker no alcohol or drug use   1 child    Past Surgical History:  Procedure  Laterality Date  . COLONOSCOPY    . ESOPHAGOGASTRODUODENOSCOPY    . LAPAROSCOPIC MYOTOMY      Family History  Problem Relation Age of Onset  . Hypertension Mother   . Heart disease Father   . Colon cancer Neg Hx   . Colon polyps Neg Hx   . Diabetes Neg Hx   . Kidney disease Neg Hx     No Known Allergies  Current Outpatient Medications on File Prior to Visit  Medication Sig Dispense Refill  . buPROPion (WELLBUTRIN XL) 150 MG 24 hr tablet TAKE 2 TABLETS DAILY (NEED OFFICE VISIT / VIRTUAL VISIT FOR FURTHER REFILLS) 180 tablet 0   No current facility-administered medications on file prior to visit.     BP 128/66   Pulse 78   Temp (!) 97.4 F (36.3 C) (Temporal)   Resp 16   Ht 5' (1.524 m)   Wt 130 lb 12.8 oz (59.3 kg)   SpO2 99%   BMI 25.55 kg/m       Objective:   Physical Exam   General Mental Status- Alert. General Appearance- Not in acute distress.   Skin General: Color- Normal Color. Moisture- Normal Moisture.  Neck Carotid Arteries- Normal color. Moisture- Normal Moisture. No carotid bruits. No JVD.  Chest and Lung Exam Auscultation: Breath Sounds:-Normal.  Cardiovascular Auscultation:Rythm- Regular. Murmurs & Other Heart Sounds:Auscultation of the heart reveals- No Murmurs.  Abdomen Inspection:-Inspeection Normal. Palpation/Percussion:Note:No mass. Palpation and Percussion of the abdomen reveal- Non Tender, Non Distended + BS, no rebound or guarding.   Neurologic Cranial Nerve exam:- CN III-XII intact(No nystagmus), symmetric smile. Strength:- 5/5 equal and symmetric strength both upper and lower extremities.  Rt knee- medial aspect mild tender to palpation presently. No redness, no swelling. On rom no crepitus. Negative homans sign.(pt states pain worse when she walks)      Assessment & Plan:  For your rt knee pain will get xray.  Can continue tramadol in event of severe pain. Rx advisement given.  Use voltaren gel and see if this helps.   If pain persists then may refer to sports med or ortho  Work note for today and tomorrow  Update me how you are doing on Monday. Then decide on follow up here or if refer.  25 minutes spent with pt today. 50% of time spent counselnig on plan going forward.   NP student Caleen Jobs also interviewed pt. Tx decision made by myself.  Mackie Pai, PA-C

## 2019-08-01 NOTE — Patient Instructions (Signed)
For your rt knee pain will get xray.  Can continue tramadol in event of severe pain. Rx advisement given.  Use voltaren gel and see if this helps.  If pain persists then may refer to sports med or ortho  Work note for today and tomorrow  Update me how you are doing on Monday. Then decide on follow up here or if refer.

## 2019-08-03 ENCOUNTER — Encounter: Payer: Self-pay | Admitting: Medical

## 2019-08-04 ENCOUNTER — Telehealth: Payer: Self-pay

## 2019-08-04 ENCOUNTER — Other Ambulatory Visit: Payer: Self-pay | Admitting: Family Medicine

## 2019-08-04 DIAGNOSIS — G8929 Other chronic pain: Secondary | ICD-10-CM

## 2019-08-04 MED ORDER — TRAMADOL HCL 50 MG PO TABS: ORAL_TABLET | ORAL | 0 refills | Status: DC

## 2019-08-04 NOTE — Telephone Encounter (Signed)
Copied from [place] [id number]. Topic: General - Other >> Aug 04, 2019  1:33 PM Rainey Pines A wrote: Patient called and is requesting a callback from nurse in regards to her medication request and would also like status update on her ortho referral. [phone]

## 2019-08-04 NOTE — Telephone Encounter (Signed)
done

## 2019-08-04 NOTE — Telephone Encounter (Signed)
Copied from [place] [id number]. Topic: General - Other >> Aug 04, 2019 10:52 AM Yvette Rack wrote: Reason for CRM: Pt stated the pharmacy informed her that they can not accept a written Rx it must be sent electronically. Pt requests that the Rx for traMADol (ULTRAM) 50 MG tablet be sent to [place], [place]. [phone] (Phone) [phone] (Fax)

## 2019-08-05 ENCOUNTER — Telehealth: Payer: Self-pay

## 2019-08-05 DIAGNOSIS — M25561 Pain in right knee: Secondary | ICD-10-CM

## 2019-08-05 NOTE — Telephone Encounter (Signed)
No I see a message about sports medicine.

## 2019-08-05 NOTE — Telephone Encounter (Signed)
Copied from [place] [id number]. Topic: Referral - Request for Referral >> Aug 05, 2019 11:42 AM Alanda Slim E wrote: Has patient seen PCP for this complaint? Yes Dr. Harvie Heck  *If NO, is insurance requiring patient see PCP for this issue before PCP can refer them? Referral for which specialty: Ortho  Preferred provider/office: Reason for referral: Knee pain/ please place as urgent / Pt is unable to walk well and needs a wheelchair due to pain/ please advise when referral has been placed   Pt has new insurance with [employer]

## 2019-08-06 NOTE — Telephone Encounter (Signed)
Pt needs referral to ortho. Pt seen by Sagiuer on 08/01/2019.

## 2019-08-07 NOTE — Telephone Encounter (Signed)
Referral to ortho placed.

## 2019-08-07 NOTE — Addendum Note (Signed)
Addended by: Anabel Halon on: 08/07/2019 04:30 PM   Modules accepted: Orders

## 2019-08-15 ENCOUNTER — Ambulatory Visit (INDEPENDENT_AMBULATORY_CARE_PROVIDER_SITE_OTHER): Payer: Medicare Other | Admitting: Orthopedic Surgery

## 2019-08-15 ENCOUNTER — Ambulatory Visit (INDEPENDENT_AMBULATORY_CARE_PROVIDER_SITE_OTHER): Payer: Medicare Other | Admitting: *Deleted

## 2019-08-15 ENCOUNTER — Encounter: Payer: Self-pay | Admitting: Orthopedic Surgery

## 2019-08-15 ENCOUNTER — Other Ambulatory Visit: Payer: Self-pay

## 2019-08-15 VITALS — Ht 60.0 in | Wt 130.0 lb

## 2019-08-15 DIAGNOSIS — Z23 Encounter for immunization: Secondary | ICD-10-CM | POA: Diagnosis not present

## 2019-08-15 DIAGNOSIS — M1711 Unilateral primary osteoarthritis, right knee: Secondary | ICD-10-CM

## 2019-08-15 NOTE — Progress Notes (Signed)
Patient here for flu vaccine.  She requested regular flu vaccine.  Vaccine given in right deltoid and patient tolerated well.

## 2019-08-16 ENCOUNTER — Encounter: Payer: Self-pay | Admitting: Orthopedic Surgery

## 2019-08-16 IMAGING — DX CHEST - 2 VIEW
2 series · 2 of 2 positions shown · non-contrast
Comparison: 11/10/2012

CLINICAL DATA: Cough for 1 month.

EXAM:
CHEST - 2 VIEW

[chest pa]
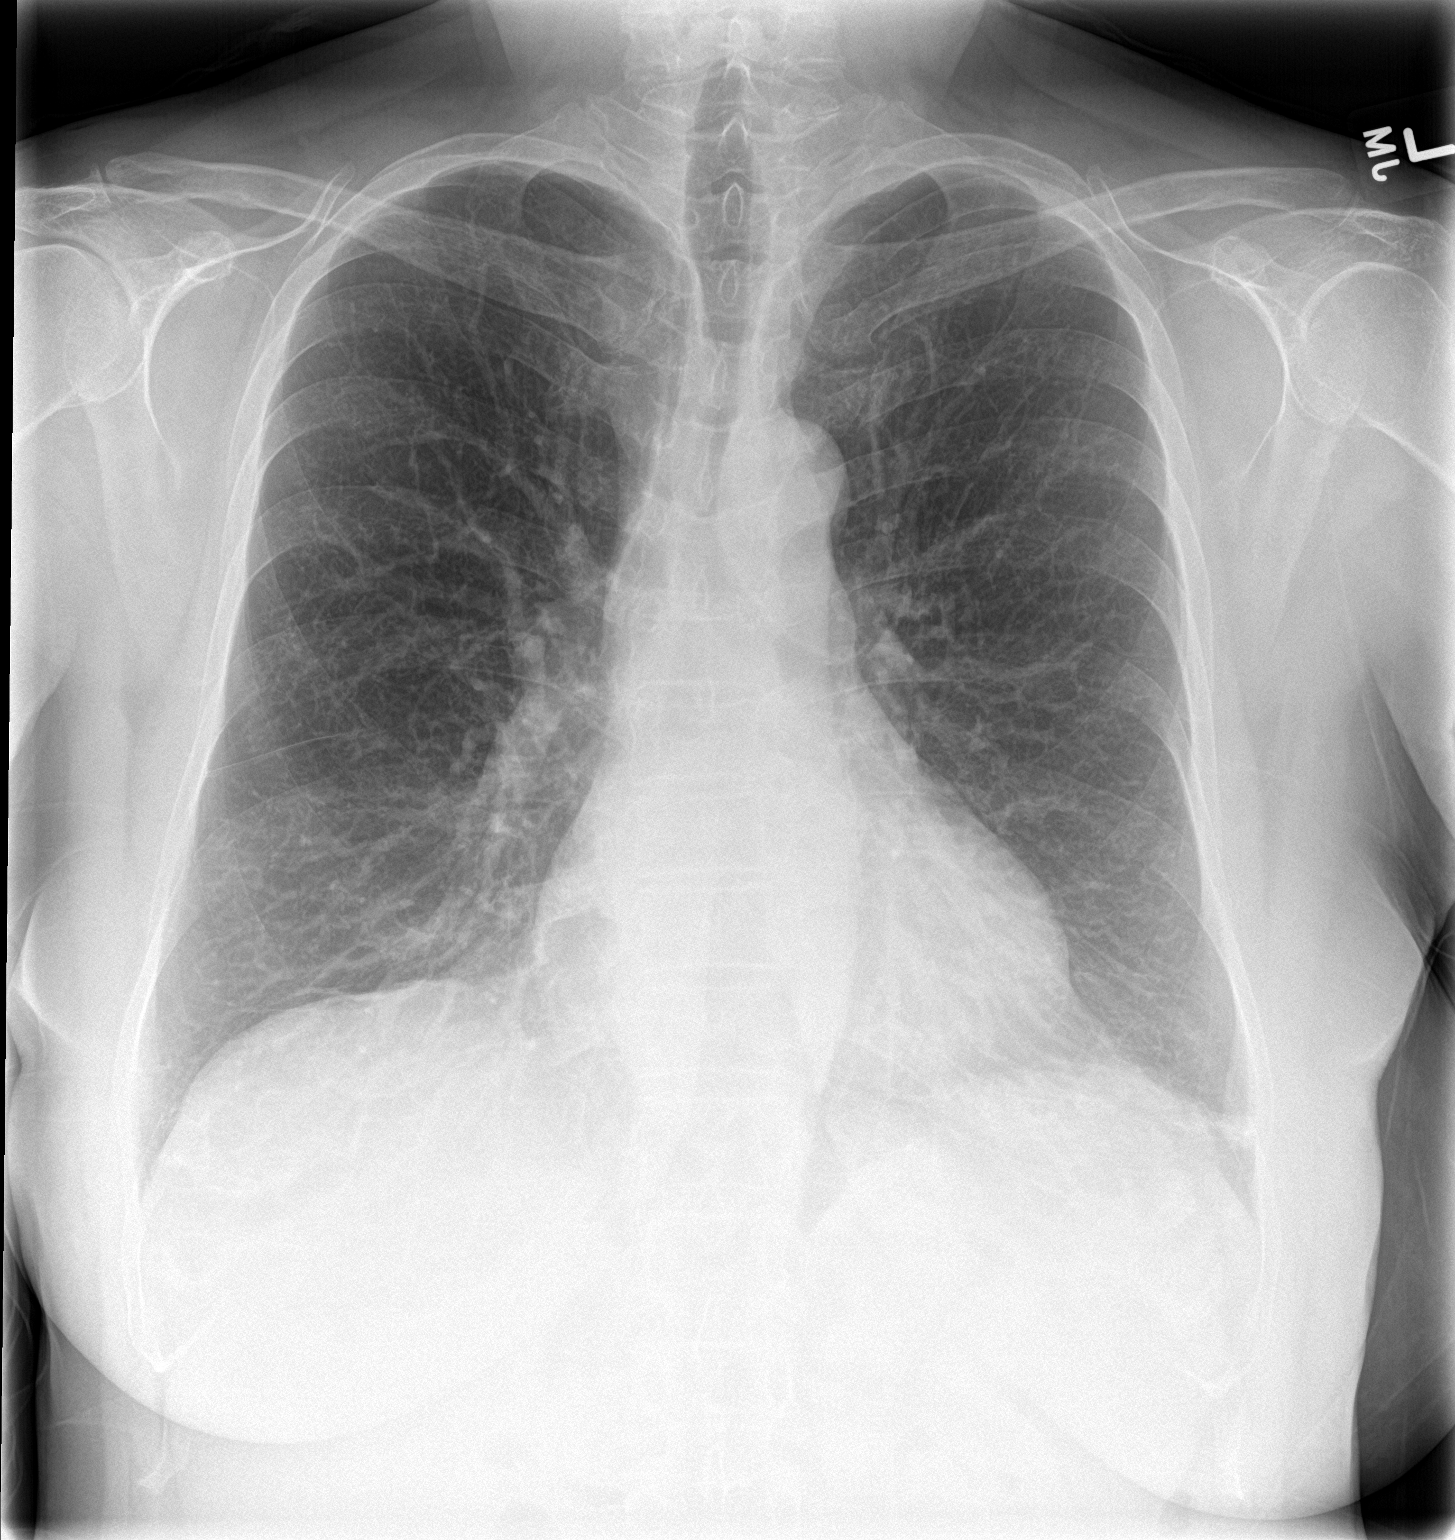

[chest lat]
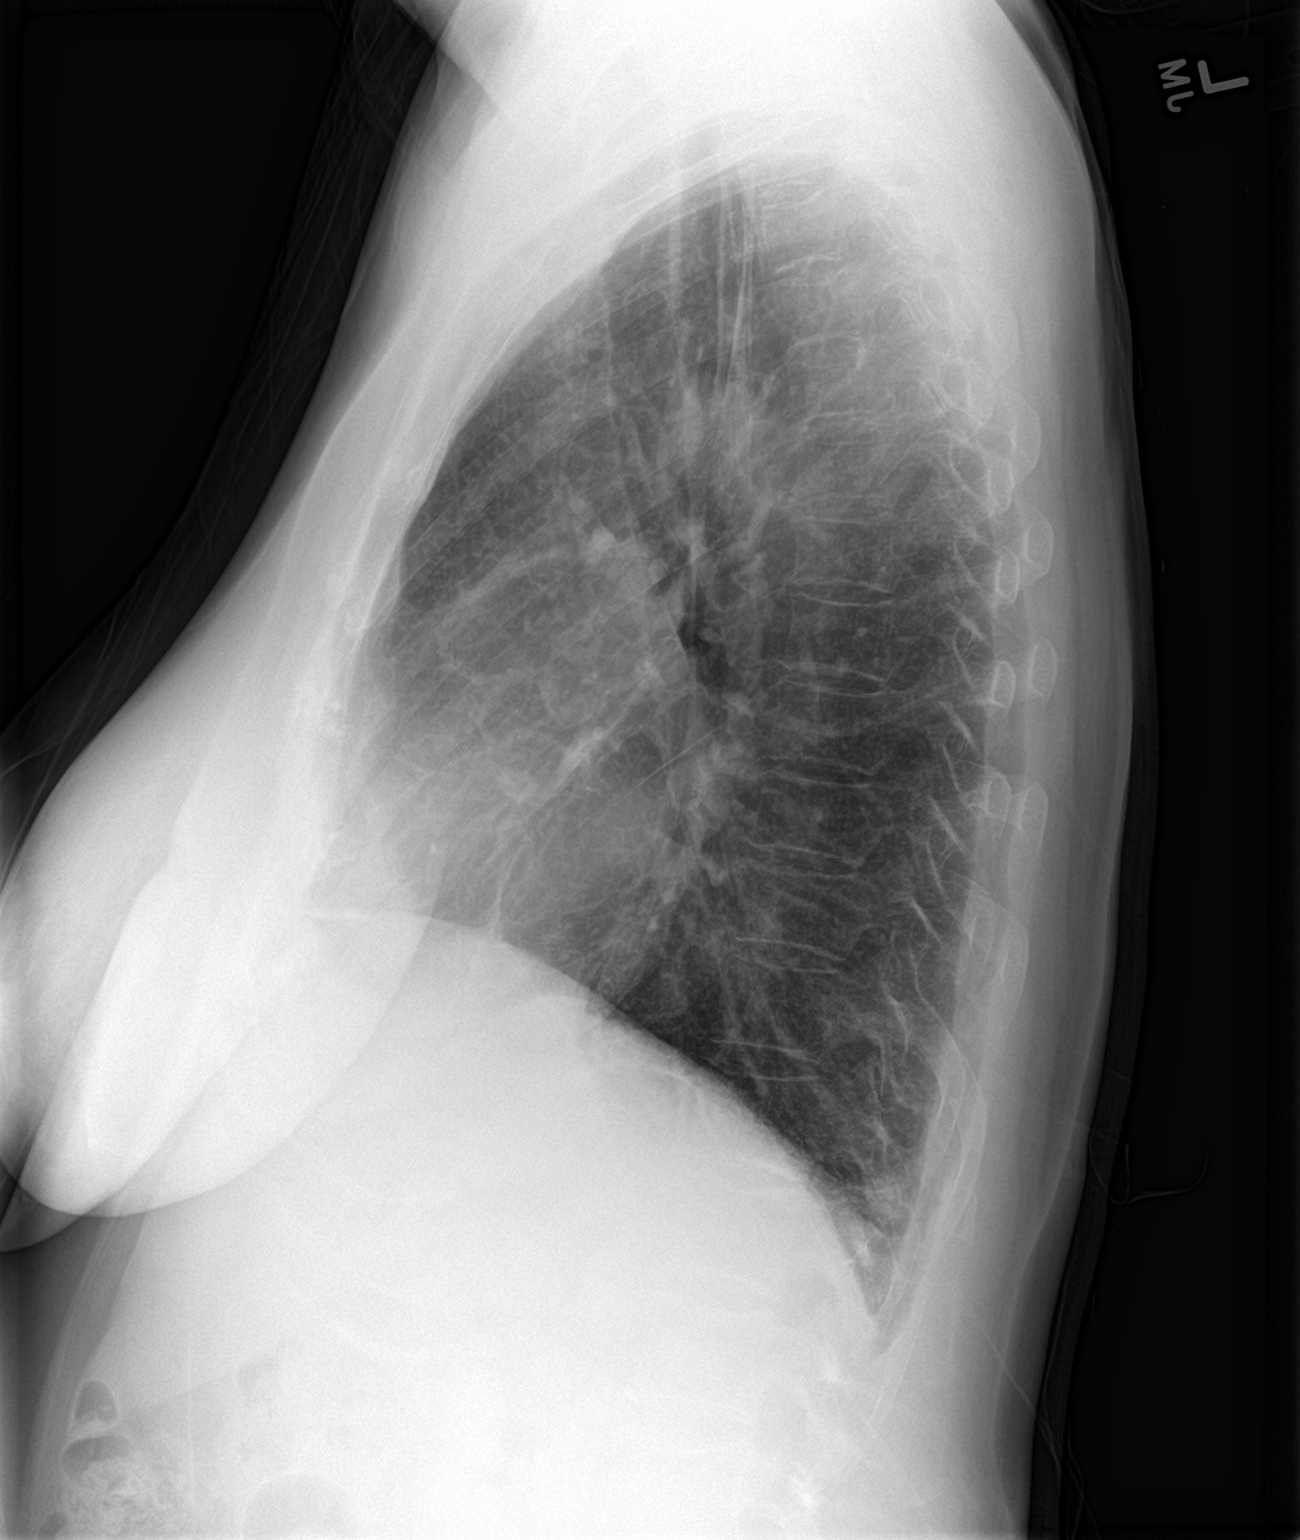

[2 of 2 positions shown; findings below may reference images not displayed]

FINDINGS: The heart size and pulmonary vascularity are normal. The lungs are
clear except for a tiny area linear scarring or atelectasis at the
left lung base laterally which is more apparent than on the prior
study. No effusions. No bone abnormality.
IMPRESSION: No significant abnormalities.

## 2019-08-16 NOTE — Progress Notes (Signed)
Office Visit Note   Patient: [name] [name]           Date of Birth: 1951-03-01           MRN: [id number] Visit Date: 08/15/2019 Requested by: Mackie Pai, PA-C [place],  [place] [id number] PCP: Carollee Herter, Alferd Apa, DO  Subjective: Chief Complaint  Patient presents with   Right Knee - Pain    HPI: [name] [name] is a 68 y.o. female who presents to the office complaining of right knee pain.  Patient states that her right knee pain began about a month ago.  She denies any injury precipitating the pain onset.  Pain began as a mild soreness but gradually worsened.  At this point her pain is constant but worse in the morning.  Her pain is also worse with stairs.  She denies any mechanical symptoms or feelings of instability.  She denies any significant swelling.  She has been using menthol, lidocaine cream, occasional Advil for pain.  She saw her PCP about 2 weeks ago and had x-rays.  Patient notes that she has never had surgery on this knee.  She localizes the majority of her pain to the medial aspect of the right patella.  Denies any history of patellar dislocation.              ROS:  All systems reviewed are negative as they relate to the chief complaint within the history of present illness.  Patient denies fevers or chills.  Assessment & Plan: Visit Diagnoses:  1. Patellofemoral arthritis of right knee     Plan: Patient is a 68 year old female who presents to the office complaining of right knee pain that has been gradually worsening over the past month.  No injuries associated with this onset of pain.  She has no mechanical or instability symptoms.  X-rays reviewed from her PCP visit which showed no joint space narrowing of the medial or lateral compartment but she does have some loss of joint space in the patellofemoral compartment.  Her exam suggests the majority of her pain is coming from the patellofemoral joint; she has positive patellar grind test and  worse pain and grinding with passive/active extension of the right knee.  She has pain with passive side to side motion of the patella.  Discussed treatment options available including anti-inflammatory medication versus knee injections versus surgery.  Patient wants to avoid surgery at all costs and wants to try a right knee injection.  Patient tolerated the procedure well and will follow up with the office in 4 weeks for clinical recheck.  May consider gel injection at that time as well.  This patient is diagnosed with osteoarthritis of the knee(s).    Radiographs show evidence of joint space narrowing, osteophytes, subchondral sclerosis and/or subchondral cysts.  This patient has knee pain which interferes with functional and activities of daily living.    This patient has experienced inadequate response, adverse effects and/or intolerance with conservative treatments such as acetaminophen, NSAIDS, topical creams, physical therapy or regular exercise, knee bracing and/or weight loss.   This patient has experienced inadequate response or has a contraindication to intra articular steroid injections for at least 3 months.   This patient is not scheduled to have a total knee replacement within 6 months of starting treatment with viscosupplementation.   Follow-Up Instructions: No follow-ups on file.   Orders:  No orders of the defined types were placed in this encounter.  No  orders of the defined types were placed in this encounter.     Procedures: Large Joint Inj: R knee on 08/18/2019 1:11 PM Indications: diagnostic evaluation, joint swelling and pain Details: 18 G 1.5 in needle, superolateral approach  Arthrogram: No  Medications: 5 mL lidocaine 1 %; 40 mg methylPREDNISolone acetate 40 MG/ML; 4 mL bupivacaine 0.25 % Outcome: tolerated well, no immediate complications Procedure, treatment alternatives, risks and benefits explained, specific risks discussed. Consent was given by the  patient. Immediately prior to procedure a time out was called to verify the correct patient, procedure, equipment, support staff and site/side marked as required. Patient was prepped and draped in the usual sterile fashion.       Clinical Data: No additional findings.  Objective: Vital Signs: Ht 5' (1.524 m)    Wt 130 lb (59 kg)    BMI 25.39 kg/m   Physical Exam:  Constitutional: Patient appears well-developed HEENT:  Head: Normocephalic Eyes:EOM are normal Neck: Normal range of motion Cardiovascular: Normal rate Pulmonary/chest: Effort normal Neurologic: Patient is alert Skin: Skin is warm Psychiatric: Patient has normal mood and affect  Ortho Exam:  Right knee Exam No effusion Extensor mechanism intact No TTP over the medial or lateral jointlines, quad tendon, patellar tendon, pes anserinus,  tibial tubercle, LCL/MCL insertions Stable to varus/valgus stresses.  Stable to anterior/posterior drawer TTP over the medial aspect of the patellofemoral joint. Positive patellar grind test Pain and patellofemoral grind elicited with passive and active extension of the right knee joint.  Pain elicited with passive side to side motion of the right patella.  Patient notes this pain elicited this is same pain that she feels on a daily basis. Extension to 0 degrees Flexion > 90 degrees  Specialty Comments:  No specialty comments available.  Imaging: No results found.   PMFS History: Patient Active Problem List   Diagnosis Date Noted   Hx of adenomatous colonic polyps 06/13/2019   Depression, major, single episode, moderate ([place]) 12/06/2017   Diabetes type 2, controlled ([place]) 04/28/2015   Mucocele of lower lip 01/13/2015   Hepatosplenomegaly 01/13/2015   Subacute appendicitis 01/13/2015   RLQ abdominal pain 12/01/2014   Gastroenteritis 11/20/2014   Knee pain, left 05/26/2014   Night sweats 12/10/2013   Lateral epicondylitis (tennis elbow) 04/24/2013   Tongue  mass 02/12/2013   RUQ pain 12/09/2012   Chest pain 11/10/2012   Hyperlipidemia    Epigastric abdominal pain 08/14/2012   Anxiety and depression 01/10/2012   Achalasia 10/24/2011   Vitamin D deficiency 10/19/2011   POLYURIA 12/21/2010   HYPERLIPIDEMIA 12/13/2007   GERD 03/07/2007   DIVERTICULOSIS, COLON 03/07/2007   Colon cancer screening 03/07/2007   LAPAROSCOPY, HX OF 03/07/2007   Past Medical History:  Diagnosis Date   Achalasia    Anxiety and depression    Diabetes type 2, controlled ([place]) 04/28/2015   Diverticulosis    Dysphagia    GERD (gastroesophageal reflux disease)    Hepatosplenomegaly    Hx of adenomatous colonic polyps 06/13/2019   Hyperlipidemia    Status post dilation of esophageal narrowing     Family History  Problem Relation Age of Onset   Hypertension Mother    Heart disease Father    Colon cancer Neg Hx    Colon polyps Neg Hx    Diabetes Neg Hx    Kidney disease Neg Hx     Past Surgical History:  Procedure Laterality Date   COLONOSCOPY     ESOPHAGOGASTRODUODENOSCOPY  LAPAROSCOPIC MYOTOMY     Social History   Occupational History   Occupation: Microbiologist: [employer]  Tobacco Use   Smoking status: Former Smoker    Packs/day: 0.20    Years: 5.00    Pack years: 1.00    Types: Cigarettes    Quit date: 11/01/1979    Years since quitting: 39.8   Smokeless tobacco: Never Used  Substance and Sexual Activity   Alcohol use: No   Drug use: No   Sexual activity: Not on file

## 2019-08-18 ENCOUNTER — Telehealth: Payer: Self-pay | Admitting: Radiology

## 2019-08-18 ENCOUNTER — Encounter: Payer: Self-pay | Admitting: Orthopedic Surgery

## 2019-08-18 DIAGNOSIS — M1711 Unilateral primary osteoarthritis, right knee: Secondary | ICD-10-CM | POA: Diagnosis not present

## 2019-08-18 MED ORDER — LIDOCAINE HCL 1 % IJ SOLN
5.0000 mL | INTRAMUSCULAR | Status: AC | PRN
  Administered 2019-08-18: 5 mL

## 2019-08-18 MED ORDER — BUPIVACAINE HCL 0.25 % IJ SOLN
4.0000 mL | INTRAMUSCULAR | Status: AC | PRN
  Administered 2019-08-18: 13:00:00 4 mL via INTRA_ARTICULAR

## 2019-08-18 MED ORDER — METHYLPREDNISOLONE ACETATE 40 MG/ML IJ SUSP
40.0000 mg | INTRAMUSCULAR | Status: AC | PRN
  Administered 2019-08-18: 13:00:00 40 mg via INTRA_ARTICULAR

## 2019-08-18 NOTE — Telephone Encounter (Signed)
Right knee gel injection. Dean pt.

## 2019-08-19 NOTE — Telephone Encounter (Signed)
Higher education careers adviser for Gel-One, right knee.  Pending VOB.

## 2019-08-20 NOTE — Telephone Encounter (Signed)
VOB still pending per online.

## 2019-08-22 NOTE — Telephone Encounter (Signed)
Please call patient and schedule bilateral knees Gel-One inejctions.  Her insurance will cover all costs.  Dr Marlou Sa patient.  Thanks.

## 2019-08-27 NOTE — Telephone Encounter (Signed)
Patient has an appointment on 09/15/19.  I updated her appointment notes to include the Gel injection.  Thank you.

## 2019-09-07 ENCOUNTER — Other Ambulatory Visit: Payer: Self-pay | Admitting: Family Medicine

## 2019-09-07 DIAGNOSIS — F321 Major depressive disorder, single episode, moderate: Secondary | ICD-10-CM

## 2019-09-15 ENCOUNTER — Ambulatory Visit: Payer: Medicare Other | Admitting: Orthopedic Surgery

## 2019-09-22 ENCOUNTER — Telehealth: Payer: Self-pay | Admitting: Orthopedic Surgery

## 2019-09-22 NOTE — Telephone Encounter (Signed)
Pt called in said she came in to see Dr.Dean on 10/30 and is needing a note stating that when she came in she was in a lot of pain and was not able to work for about 7-8 weeks due to her pain in her right knee.   [phone]

## 2019-09-22 NOTE — Telephone Encounter (Signed)
Please advise if ok. Thanks.

## 2019-09-23 NOTE — Telephone Encounter (Signed)
Based on the medical record I think we could do 2 weeks max out of work from 1030.  Thanks

## 2019-09-23 NOTE — Telephone Encounter (Signed)
Note written. S/w patient and advised. She will print off through [employer].

## 2019-11-17 ENCOUNTER — Other Ambulatory Visit: Payer: Self-pay

## 2019-11-18 ENCOUNTER — Other Ambulatory Visit: Payer: Self-pay

## 2019-11-18 ENCOUNTER — Ambulatory Visit (INDEPENDENT_AMBULATORY_CARE_PROVIDER_SITE_OTHER): Payer: Medicare Other | Admitting: Family Medicine

## 2019-11-18 ENCOUNTER — Encounter: Payer: Self-pay | Admitting: Family Medicine

## 2019-11-18 VITALS — BP 118/70 | HR 90 | Temp 97.1°F | Resp 18 | Ht 60.0 in | Wt 130.6 lb

## 2019-11-18 DIAGNOSIS — R1013 Epigastric pain: Secondary | ICD-10-CM | POA: Diagnosis not present

## 2019-11-18 DIAGNOSIS — R350 Frequency of micturition: Secondary | ICD-10-CM

## 2019-11-18 DIAGNOSIS — E559 Vitamin D deficiency, unspecified: Secondary | ICD-10-CM

## 2019-11-18 DIAGNOSIS — Z8249 Family history of ischemic heart disease and other diseases of the circulatory system: Secondary | ICD-10-CM | POA: Diagnosis not present

## 2019-11-18 DIAGNOSIS — K219 Gastro-esophageal reflux disease without esophagitis: Secondary | ICD-10-CM

## 2019-11-18 DIAGNOSIS — F321 Major depressive disorder, single episode, moderate: Secondary | ICD-10-CM | POA: Diagnosis not present

## 2019-11-18 LAB — CBC WITH DIFFERENTIAL/PLATELET
Basophils Absolute: 0 10*3/uL (ref 0.0–0.1)
Basophils Relative: 0.6 % (ref 0.0–3.0)
Eosinophils Absolute: 0.1 10*3/uL (ref 0.0–0.7)
Eosinophils Relative: 2.1 % (ref 0.0–5.0)
HCT: 44.9 % (ref 36.0–46.0)
Hemoglobin: 15.3 g/dL — ABNORMAL HIGH (ref 12.0–15.0)
Lymphocytes Relative: 32 % (ref 12.0–46.0)
Lymphs Abs: 2 10*3/uL (ref 0.7–4.0)
MCHC: 34.2 g/dL (ref 30.0–36.0)
MCV: 88 fl (ref 78.0–100.0)
Monocytes Absolute: 0.6 10*3/uL (ref 0.1–1.0)
Monocytes Relative: 8.7 % (ref 3.0–12.0)
Neutro Abs: 3.6 10*3/uL (ref 1.4–7.7)
Neutrophils Relative %: 56.6 % (ref 43.0–77.0)
Platelets: 227 10*3/uL (ref 150.0–400.0)
RBC: 5.1 Mil/uL (ref 3.87–5.11)
RDW: 12.7 % (ref 11.5–15.5)
WBC: 6.3 10*3/uL (ref 4.0–10.5)

## 2019-11-18 LAB — POC URINALSYSI DIPSTICK (AUTOMATED)
Bilirubin, UA: NEGATIVE
Blood, UA: NEGATIVE
Glucose, UA: NEGATIVE
Ketones, UA: NEGATIVE
Leukocytes, UA: NEGATIVE
Nitrite, UA: NEGATIVE
Protein, UA: NEGATIVE
Spec Grav, UA: 1.025 (ref 1.010–1.025)
Urobilinogen, UA: 0.2 E.U./dL
pH, UA: 5.5 (ref 5.0–8.0)

## 2019-11-18 LAB — COMPREHENSIVE METABOLIC PANEL
ALT: 48 U/L — ABNORMAL HIGH (ref 0–35)
AST: 30 U/L (ref 0–37)
Albumin: 4.5 g/dL (ref 3.5–5.2)
Alkaline Phosphatase: 78 U/L (ref 39–117)
BUN: 16 mg/dL (ref 6–23)
CO2: 30 mEq/L (ref 19–32)
Calcium: 10.5 mg/dL (ref 8.4–10.5)
Chloride: 100 mEq/L (ref 96–112)
Creatinine, Ser: 0.92 mg/dL (ref 0.40–1.20)
GFR: 60.55 mL/min (ref >60.00)
Glucose, Bld: 105 mg/dL — ABNORMAL HIGH (ref 70–99)
Potassium: 4.9 mEq/L (ref 3.5–5.1)
Sodium: 137 mEq/L (ref 135–145)
Total Bilirubin: 0.6 mg/dL (ref 0.2–1.2)
Total Protein: 7 g/dL (ref 6.0–8.3)

## 2019-11-18 LAB — LDL CHOLESTEROL, DIRECT: Direct LDL: 136 mg/dL

## 2019-11-18 LAB — LIPID PANEL
Cholesterol: 217 mg/dL — ABNORMAL HIGH (ref 0–200)
HDL: 42.4 mg/dL (ref >39.00)
NonHDL: 174.16
Total CHOL/HDL Ratio: 5
Triglycerides: 216 mg/dL — ABNORMAL HIGH (ref 0.0–149.0)
VLDL: 43.2 mg/dL — ABNORMAL HIGH (ref 0.0–40.0)

## 2019-11-18 LAB — TSH: TSH: 2.2 u[IU]/mL (ref 0.35–4.50)

## 2019-11-18 LAB — VITAMIN D 25 HYDROXY (VIT D DEFICIENCY, FRACTURES): VITD: 114.84 ng/mL (ref 30.00–100.00)

## 2019-11-18 MED ORDER — BUPROPION HCL ER (XL) 150 MG PO TB24: ORAL_TABLET | ORAL | 3 refills | Status: DC

## 2019-11-18 MED ORDER — BUPROPION HCL ER (XL) 150 MG PO TB24: ORAL_TABLET | ORAL | 0 refills | Status: DC

## 2019-11-18 NOTE — Progress Notes (Signed)
Patient ID: [name] [name], female    DOB: 06-12-1951  Age: 69 y.o. MRN: [id number]    Subjective:  Subjective  HPI [name] [name] presents for med refill for depression-- she has no complaints and is doing well  She is is also c/o frequent urination and eating and increased indigestion  Pt has tried prilosec for her stomach and it helps     Review of Systems  Constitutional: Negative for appetite change, diaphoresis, fatigue and unexpected weight change.  Eyes: Negative for pain, redness and visual disturbance.  Respiratory: Negative for cough, chest tightness, shortness of breath and wheezing.   Cardiovascular: Negative for chest pain, palpitations and leg swelling.  Endocrine: Negative for cold intolerance, heat intolerance, polydipsia, polyphagia and polyuria.  Genitourinary: Negative for difficulty urinating, dysuria and frequency.  Neurological: Negative for dizziness, light-headedness, numbness and headaches.    History Past Medical History:  Diagnosis Date  . Achalasia   . Anxiety and depression   . Diabetes type 2, controlled ([place]) 04/28/2015  . Diverticulosis   . Dysphagia   . GERD (gastroesophageal reflux disease)   . Hepatosplenomegaly   . Hx of adenomatous colonic polyps 06/13/2019  . Hyperlipidemia   . Status post dilation of esophageal narrowing     She has a past surgical history that includes Laparoscopic myotomy; Esophagogastroduodenoscopy; and Colonoscopy.   Her family history includes Depression in her father; Heart disease in her father; Hypertension in her mother.She reports that she quit smoking about 40 years ago. Her smoking use included cigarettes. She has a 1.00 pack-year smoking history. She has never used smokeless tobacco. She reports that she does not drink alcohol or use drugs.  No current outpatient medications on file prior to visit.   No current facility-administered medications on file prior to visit.     Objective:  Objective  Physical  Exam Vitals and nursing note reviewed.  Constitutional:      Appearance: She is well-developed.  HENT:     Head: Normocephalic and atraumatic.  Eyes:     Conjunctiva/sclera: Conjunctivae normal.  Neck:     Thyroid: No thyromegaly.     Vascular: No carotid bruit or JVD.  Cardiovascular:     Rate and Rhythm: Normal rate and regular rhythm.     Heart sounds: Normal heart sounds. No murmur.  Pulmonary:     Effort: Pulmonary effort is normal. No respiratory distress.     Breath sounds: Normal breath sounds. No wheezing or rales.  Chest:     Chest wall: No tenderness.  Musculoskeletal:     Cervical back: Normal range of motion and neck supple.  Neurological:     Mental Status: She is alert and oriented to person, place, and time.    BP 118/70 (BP Location: Right Arm, Patient Position: Sitting, Cuff Size: Normal)   Pulse 90   Temp (!) 97.1 F (36.2 C) (Temporal)   Resp 18   Ht 5' (1.524 m)   Wt 130 lb 9.6 oz (59.2 kg)   SpO2 96%   BMI 25.51 kg/m  Wt Readings from Last 3 Encounters:  11/18/19 130 lb 9.6 oz (59.2 kg)  08/15/19 130 lb (59 kg)  08/01/19 130 lb 12.8 oz (59.3 kg)     Lab Results  Component Value Date   WBC 6.3 11/18/2019   HGB 15.3 (H) 11/18/2019   HCT 44.9 11/18/2019   PLT 227.0 11/18/2019   GLUCOSE 105 (H) 11/18/2019   CHOL 217 (H) 11/18/2019   TRIG 216.0 (H)  11/18/2019   HDL 42.40 11/18/2019   LDLDIRECT 136.0 11/18/2019   LDLCALC 95 12/21/2010   ALT 48 (H) 11/18/2019   AST 30 11/18/2019   NA 137 11/18/2019   K 4.9 11/18/2019   CL 100 11/18/2019   CREATININE 0.92 11/18/2019   BUN 16 11/18/2019   CO2 30 11/18/2019   TSH 2.20 11/18/2019   INR 1.0 03/11/2015   HGBA1C 6.3 12/03/2018   MICROALBUR 2.2 10/31/2016    DG KNEE 3 VIEW RIGHT  Result Date: 08/01/2019 CLINICAL DATA:  Knee pain for 10 days, no injury EXAM: RIGHT KNEE - 3 VIEW COMPARISON:  None. FINDINGS: No fracture or dislocation of the right knee. Joint spaces are well preserved. No  knee joint effusion. Soft tissues are unremarkable. IMPRESSION: No fracture or dislocation of the right knee. Joint spaces are well preserved. No knee joint effusion. Electronically Signed   By: Eddie Candle M.D.   On: 08/01/2019 11:01     Assessment & Plan:  Plan  I have discontinued [name]'s traMADol. I am also having her maintain her buPROPion.  Meds ordered this encounter  Medications  . DISCONTD: buPROPion (WELLBUTRIN XL) 150 MG 24 hr tablet    Sig: TAKE 2 TABLETS DAILY (NEED OFFICE VISIT / VIRTUAL VISIT FOR FURTHER REFILLS)    Dispense:  180 tablet    Refill:  3  . buPROPion (WELLBUTRIN XL) 150 MG 24 hr tablet    Sig: TAKE 2 TABLETS DAILY (NEED OFFICE VISIT / VIRTUAL VISIT FOR FURTHER REFILLS)    Dispense:  30 tablet    Refill:  0    Problem List Items Addressed This Visit      Unprioritized   Depression, major, single episode, moderate ([place])    Refill depression  Pt is more depressed due to covid and other stressors        Relevant Medications   buPROPion (WELLBUTRIN XL) 150 MG 24 hr tablet   Other Relevant Orders   CBC with Differential/Platelet (Completed)   TSH (Completed)   Comprehensive metabolic panel (Completed)   Vitamin D (25 hydroxy) (Completed)   GERD    prilosec daily gerd diet given  GI if no better        Other Visit Diagnoses    Frequent urination    -  Primary   Relevant Orders   Comprehensive metabolic panel (Completed)   POCT Urinalysis Dipstick (Automated) (Completed)   Dyspepsia       Relevant Orders   Comprehensive metabolic panel (Completed)   Family history of heart disease       Relevant Orders   Lipid panel (Completed)   CBC with Differential/Platelet (Completed)   TSH (Completed)   Comprehensive metabolic panel (Completed)   Vitamin D (25 hydroxy) (Completed)   Vitamin D deficiency       Relevant Orders   Vitamin D (25 hydroxy) (Completed)      Follow-up: Return in about 1 year (around 11/17/2020), or if symptoms  worsen or fail to improve, for wellness with RN and f/u with me.  Ann Held, DO

## 2019-11-18 NOTE — Patient Instructions (Addendum)
COVID-19 Vaccine Information can be found at: ShippingScam.co.uk For questions related to vaccine distribution or appointments, please email vaccine@[email or web address] .[email or web address] or call [phone].          Food Choices for Gastroesophageal Reflux Disease, Adult When you have gastroesophageal reflux disease (GERD), the foods you eat and your eating habits are very important. Choosing the right foods can help ease your discomfort. Think about working with a nutrition specialist (dietitian) to help you make good choices. What are tips for following this plan?  Meals  Choose healthy foods that are low in fat, such as fruits, vegetables, whole grains, low-fat dairy products, and lean meat, fish, and poultry.  Eat small meals often instead of 3 large meals a day. Eat your meals slowly, and in a place where you are relaxed. Avoid bending over or lying down until 2-3 hours after eating.  Avoid eating meals 2-3 hours before bed.  Avoid drinking a lot of liquid with meals.  Cook foods using methods other than frying. Bake, grill, or broil food instead.  Avoid or limit: ? Chocolate. ? Peppermint or spearmint. ? Alcohol. ? Pepper. ? Black and decaffeinated coffee. ? Black and decaffeinated tea. ? Bubbly (carbonated) soft drinks. ? Caffeinated energy drinks and soft drinks.  Limit high-fat foods such as: ? Fatty meat or fried foods. ? Whole milk, cream, butter, or ice cream. ? Nuts and nut butters. ? Pastries, donuts, and sweets made with butter or shortening.  Avoid foods that cause symptoms. These foods may be different for everyone. Common foods that cause symptoms include: ? Tomatoes. ? Oranges, lemons, and limes. ? Peppers. ? Spicy food. ? Onions and garlic. ? Vinegar. Lifestyle  Maintain a healthy weight. Ask your doctor what weight is healthy for you. If you need to lose weight, work with your doctor to do so  safely.  Exercise for at least 30 minutes for 5 or more days each week, or as told by your doctor.  Wear loose-fitting clothes.  Do not smoke. If you need help quitting, ask your doctor.  Sleep with the head of your bed higher than your feet. Use a wedge under the mattress or blocks under the bed frame to raise the head of the bed. Summary  When you have gastroesophageal reflux disease (GERD), food and lifestyle choices are very important in easing your symptoms.  Eat small meals often instead of 3 large meals a day. Eat your meals slowly, and in a place where you are relaxed.  Limit high-fat foods such as fatty meat or fried foods.  Avoid bending over or lying down until 2-3 hours after eating.  Avoid peppermint and spearmint, caffeine, alcohol, and chocolate. This information is not intended to replace advice given to you by your health care provider. Make sure you discuss any questions you have with your health care provider. Document Revised: 01/23/2019 Document Reviewed: 11/07/2016 Elsevier Patient Education  [place].

## 2019-11-19 ENCOUNTER — Other Ambulatory Visit: Payer: Self-pay | Admitting: Family Medicine

## 2019-11-19 DIAGNOSIS — D582 Other hemoglobinopathies: Secondary | ICD-10-CM

## 2019-11-19 DIAGNOSIS — R350 Frequency of micturition: Secondary | ICD-10-CM | POA: Insufficient documentation

## 2019-11-19 DIAGNOSIS — E785 Hyperlipidemia, unspecified: Secondary | ICD-10-CM

## 2019-11-19 NOTE — Assessment & Plan Note (Signed)
Refill depression  Pt is more depressed due to covid and other stressors

## 2019-11-19 NOTE — Assessment & Plan Note (Signed)
Check ua and culture 

## 2019-11-19 NOTE — Assessment & Plan Note (Signed)
prilosec daily gerd diet given  GI if no better

## 2019-11-20 ENCOUNTER — Encounter: Payer: Self-pay | Admitting: Family Medicine

## 2020-03-12 ENCOUNTER — Ambulatory Visit: Payer: Self-pay

## 2020-03-12 ENCOUNTER — Encounter: Payer: Self-pay | Admitting: Orthopedic Surgery

## 2020-03-12 ENCOUNTER — Ambulatory Visit (INDEPENDENT_AMBULATORY_CARE_PROVIDER_SITE_OTHER): Payer: Medicare Other | Admitting: Orthopedic Surgery

## 2020-03-12 ENCOUNTER — Other Ambulatory Visit: Payer: Self-pay

## 2020-03-12 VITALS — Ht 60.0 in | Wt 130.0 lb

## 2020-03-12 DIAGNOSIS — M1712 Unilateral primary osteoarthritis, left knee: Secondary | ICD-10-CM

## 2020-03-12 DIAGNOSIS — M25562 Pain in left knee: Secondary | ICD-10-CM

## 2020-03-13 ENCOUNTER — Encounter: Payer: Self-pay | Admitting: Orthopedic Surgery

## 2020-03-13 NOTE — Progress Notes (Signed)
Office Visit Note   Patient: [name] [name]           Date of Birth: 23-Aug-1951           MRN: [id number] Visit Date: 03/12/2020 Requested by: [address] [place], [place] [address] [place],  [place] [address] PCP: Carollee Herter, Alferd Apa, DO  Subjective: Chief Complaint  Patient presents with  . Left Knee - Pain    HPI: [name] [name] is a 69 y.o. female who presents to the office complaining of left knee pain.  Report pain is been ongoing for 2 weeks.  Localizes pain to the medial aspect of the knee.  Denies mechanical symptoms or feelings of instability.  Denies any groin pain or low back pain with radicular symptoms.  Patient takes over-the-counter Tylenol as well as Advil with little relief.  They note history of last injection in right knee with good relief for 6 months.  Pain does not wake her up at night and does not bother her at rest.  They deny any history of diabetes.  She wants to avoid any type of surgical intervention in the knee if possible.              ROS:  All systems reviewed are negative as they relate to the chief complaint within the history of present illness.  Patient denies fevers or chills.  Assessment & Plan: Visit Diagnoses:  1. Acute pain of left knee     Plan: Patient is a 69 year old female presents complaining of left knee pain.  She has had left knee pain that is worsening over the last 2 weeks.  She has had a previous injection in her right knee that provided 6 months of relief.  Pain does not wake her up at night.  She denies any mechanical symptoms or instability.  She requests cortisone injection today.  Radiographs of the left knee taken today reveal mild to moderate narrowing of the medial compartment of the left knee.  No fractures or dislocations noted.  Per patient's request, a left knee cortisone injection was administered.  Patient tolerated procedure well.  Plan for patient to follow-up as needed.  Follow-Up Instructions: No follow-ups  on file.   Orders:  Orders Placed This Encounter  Procedures  . XR KNEE 3 VIEW LEFT   No orders of the defined types were placed in this encounter.     Procedures: Large Joint Inj: L knee on 03/14/2020 9:21 PM Indications: diagnostic evaluation, joint swelling and pain Details: 18 G 1.5 in needle, superolateral approach  Arthrogram: No  Medications: 5 mL lidocaine 1 %; 40 mg methylPREDNISolone acetate 40 MG/ML; 4 mL bupivacaine 0.25 % Outcome: tolerated well, no immediate complications Procedure, treatment alternatives, risks and benefits explained, specific risks discussed. Consent was given by the patient. Immediately prior to procedure a time out was called to verify the correct patient, procedure, equipment, support staff and site/side marked as required. Patient was prepped and draped in the usual sterile fashion.       Clinical Data: No additional findings.  Objective: Vital Signs: Ht 5' (1.524 m)   Wt 130 lb (59 kg)   BMI 25.39 kg/m   Physical Exam:  Constitutional: Patient appears well-developed HEENT:  Head: Normocephalic Eyes:EOM are normal Neck: Normal range of motion Cardiovascular: Normal rate Pulmonary/chest: Effort normal Neurologic: Patient is alert Skin: Skin is warm Psychiatric: Patient has normal mood and affect  Ortho Exam:  Left knee Exam No  effusion Extensor mechanism intact Mild to moderate tenderness palpation of the medial joint line No TTP over the lateral jointlines, quad tendon, patellar tendon, pes anserinus, patella, tibial tubercle, LCL/MCL insertions Stable to varus/valgus stresses.  Stable to anterior/posterior drawer Extension to 0 degrees Flexion > 90 degrees No pain with hip range of motion.  Negative straight leg raise.  Specialty Comments:  No specialty comments available.  Imaging: No results found.   PMFS History: Patient Active Problem List   Diagnosis Date Noted  . Frequent urination 11/19/2019  . Hx of  adenomatous colonic polyps 06/13/2019  . Depression, major, single episode, moderate ([place]) 12/06/2017  . Diabetes type 2, controlled ([place]) 04/28/2015  . Mucocele of lower lip 01/13/2015  . Hepatosplenomegaly 01/13/2015  . Subacute appendicitis 01/13/2015  . RLQ abdominal pain 12/01/2014  . Gastroenteritis 11/20/2014  . Knee pain, left 05/26/2014  . Night sweats 12/10/2013  . Lateral epicondylitis (tennis elbow) 04/24/2013  . Tongue mass 02/12/2013  . RUQ pain 12/09/2012  . Chest pain 11/10/2012  . Hyperlipidemia   . Epigastric abdominal pain 08/14/2012  . Anxiety and depression 01/10/2012  . Achalasia 10/24/2011  . Vitamin D deficiency 10/19/2011  . POLYURIA 12/21/2010  . HYPERLIPIDEMIA 12/13/2007  . GERD 03/07/2007  . DIVERTICULOSIS, COLON 03/07/2007  . Colon cancer screening 03/07/2007  . LAPAROSCOPY, HX OF 03/07/2007   Past Medical History:  Diagnosis Date  . Achalasia   . Anxiety and depression   . Diabetes type 2, controlled ([place]) 04/28/2015  . Diverticulosis   . Dysphagia   . GERD (gastroesophageal reflux disease)   . Hepatosplenomegaly   . Hx of adenomatous colonic polyps 06/13/2019  . Hyperlipidemia   . Status post dilation of esophageal narrowing     Family History  Problem Relation Age of Onset  . Hypertension Mother   . Heart disease Father   . Depression Father   . Colon cancer Neg Hx   . Colon polyps Neg Hx   . Diabetes Neg Hx   . Kidney disease Neg Hx     Past Surgical History:  Procedure Laterality Date  . COLONOSCOPY    . ESOPHAGOGASTRODUODENOSCOPY    . LAPAROSCOPIC MYOTOMY     Social History   Occupational History  . 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 Smoking status: Former Smoker    Packs/day: 0.20    Years: 5.00    Pack years: 1.00    Types: Cigarettes    Quit date: 11/01/1979    Years since quitting: 40.3  . Smokeless tobacco: Never Used  Substance and Sexual Activity  .  Alcohol use: No  . Drug use: No  . Sexual activity: Not on file

## 2020-03-14 DIAGNOSIS — M1712 Unilateral primary osteoarthritis, left knee: Secondary | ICD-10-CM | POA: Diagnosis not present

## 2020-03-14 MED ORDER — BUPIVACAINE HCL 0.25 % IJ SOLN
4.0000 mL | INTRAMUSCULAR | Status: AC | PRN
  Administered 2020-03-14: 4 mL via INTRA_ARTICULAR

## 2020-03-14 MED ORDER — LIDOCAINE HCL 1 % IJ SOLN
5.0000 mL | INTRAMUSCULAR | Status: AC | PRN
  Administered 2020-03-14: 5 mL

## 2020-03-14 MED ORDER — METHYLPREDNISOLONE ACETATE 40 MG/ML IJ SUSP
40.0000 mg | INTRAMUSCULAR | Status: AC | PRN
  Administered 2020-03-14: 40 mg via INTRA_ARTICULAR

## 2020-06-27 ENCOUNTER — Emergency Department (HOSPITAL_BASED_OUTPATIENT_CLINIC_OR_DEPARTMENT_OTHER): Payer: Medicare Other

## 2020-06-27 ENCOUNTER — Encounter (HOSPITAL_BASED_OUTPATIENT_CLINIC_OR_DEPARTMENT_OTHER): Payer: Self-pay | Admitting: Emergency Medicine

## 2020-06-27 ENCOUNTER — Emergency Department (HOSPITAL_BASED_OUTPATIENT_CLINIC_OR_DEPARTMENT_OTHER)
Admission: EM | Admit: 2020-06-27 | Discharge: 2020-06-27 | Disposition: A | Discharge status: Home / Self Care | Payer: Medicare Other | Attending: Emergency Medicine | Admitting: Emergency Medicine

## 2020-06-27 ENCOUNTER — Other Ambulatory Visit: Payer: Self-pay

## 2020-06-27 DIAGNOSIS — Z79899 Other long term (current) drug therapy: Secondary | ICD-10-CM | POA: Diagnosis not present

## 2020-06-27 DIAGNOSIS — R002 Palpitations: Secondary | ICD-10-CM | POA: Diagnosis not present

## 2020-06-27 DIAGNOSIS — R079 Chest pain, unspecified: Secondary | ICD-10-CM | POA: Insufficient documentation

## 2020-06-27 DIAGNOSIS — Z87891 Personal history of nicotine dependence: Secondary | ICD-10-CM | POA: Diagnosis not present

## 2020-06-27 DIAGNOSIS — E119 Type 2 diabetes mellitus without complications: Secondary | ICD-10-CM | POA: Insufficient documentation

## 2020-06-27 LAB — CBC
HCT: 39.9 % (ref 36.0–46.0)
Hemoglobin: 13.9 g/dL (ref 12.0–15.0)
MCH: 29.9 pg (ref 26.0–34.0)
MCHC: 34.8 g/dL (ref 30.0–36.0)
MCV: 85.8 fL (ref 80.0–100.0)
Platelets: 202 10*3/uL (ref 150–400)
RBC: 4.65 MIL/uL (ref 3.87–5.11)
RDW: 12.1 % (ref 11.5–15.5)
WBC: 6.8 10*3/uL (ref 4.0–10.5)
nRBC: 0 % (ref 0.0–0.2)

## 2020-06-27 LAB — BASIC METABOLIC PANEL
Anion gap: 11 (ref 5–15)
BUN: 16 mg/dL (ref 8–23)
CO2: 24 mmol/L (ref 22–32)
Calcium: 10.5 mg/dL — ABNORMAL HIGH (ref 8.9–10.3)
Chloride: 104 mmol/L (ref 98–111)
Creatinine, Ser: 0.73 mg/dL (ref 0.44–1.00)
GFR calc Af Amer: >60 mL/min (ref >60)
GFR calc non Af Amer: >60 mL/min (ref >60)
Glucose, Bld: 124 mg/dL — ABNORMAL HIGH (ref 70–99)
Potassium: 3.7 mmol/L (ref 3.5–5.1)
Sodium: 139 mmol/L (ref 135–145)

## 2020-06-27 LAB — TROPONIN I (HIGH SENSITIVITY): Troponin I (High Sensitivity): 2 ng/L (ref <18)

## 2020-06-27 NOTE — ED Triage Notes (Signed)
Reports heart has been racing on and off for a week.  Denies having any pain.  Does endorse a little sob.

## 2020-06-27 NOTE — Discharge Instructions (Signed)
Your heart rate is normal and her heart enzyme is normal right now.  See your primary care doctor and follow-up with cardiology if you have persistent palpitations and chest pain  Return to ER if you have worsening chest pain, shortness of breath

## 2020-06-27 NOTE — ED Notes (Signed)
ED Provider at bedside. 

## 2020-06-27 NOTE — ED Provider Notes (Signed)
[facility] EMERGENCY DEPARTMENT Provider Note   CSN: [id number] Arrival date & time: 06/27/20  1753     History Chief Complaint  Patient presents with  . Tachycardia    [name] [name] is a 69 y.o. female history diabetes, depression, here presenting with palpitations and intermittent chest pain.  Patient states that there is a stressful event happening in the family and she is very anxious.  She states that over the last week she noticed that her heart sometimes races.  She also has intermittent chest pain as well. She states that " her heart will come out of her chest and that she will die".  She has some subjective shortness of breath but denies any fevers or chills or cough.  Denies any Covid exposure.  The history is provided by the patient.       Past Medical History:  Diagnosis Date  . Achalasia   . Anxiety and depression   . Diabetes type 2, controlled ([place]) 04/28/2015  . Diverticulosis   . Dysphagia   . GERD (gastroesophageal reflux disease)   . Hepatosplenomegaly   . Hx of adenomatous colonic polyps 06/13/2019  . Hyperlipidemia   . Status post dilation of esophageal narrowing     Patient Active Problem List   Diagnosis Date Noted  . Frequent urination 11/19/2019  . Hx of adenomatous colonic polyps 06/13/2019  . Depression, major, single episode, moderate ([place]) 12/06/2017  . Diabetes type 2, controlled ([place]) 04/28/2015  . Mucocele of lower lip 01/13/2015  . Hepatosplenomegaly 01/13/2015  . Subacute appendicitis 01/13/2015  . RLQ abdominal pain 12/01/2014  . Gastroenteritis 11/20/2014  . Knee pain, left 05/26/2014  . Night sweats 12/10/2013  . Lateral epicondylitis (tennis elbow) 04/24/2013  . Tongue mass 02/12/2013  . RUQ pain 12/09/2012  . Chest pain 11/10/2012  . Hyperlipidemia   . Epigastric abdominal pain 08/14/2012  . Anxiety and depression 01/10/2012  . Achalasia 10/24/2011  . Vitamin D deficiency 10/19/2011  . POLYURIA 12/21/2010  .  HYPERLIPIDEMIA 12/13/2007  . GERD 03/07/2007  . DIVERTICULOSIS, COLON 03/07/2007  . Colon cancer screening 03/07/2007  . LAPAROSCOPY, HX OF 03/07/2007    Past Surgical History:  Procedure Laterality Date  . COLONOSCOPY    . ESOPHAGOGASTRODUODENOSCOPY    . LAPAROSCOPIC MYOTOMY       OB History    Gravida  2   Para  2   Term      Preterm      AB      Living  2     SAB      TAB      Ectopic      Multiple      Live Births              Family History  Problem Relation Age of Onset  . Hypertension Mother   . Heart disease Father   . Depression Father   . Colon cancer Neg Hx   . Colon polyps Neg Hx   . Diabetes Neg Hx   . Kidney disease Neg Hx     Social History   Tobacco Use  . Smoking status: Former Smoker    Packs/day: 0.20    Years: 5.00    Pack years: 1.00    Types: Cigarettes    Quit date: 11/01/1979    Years since quitting: 40.6  . Smokeless tobacco: Never Used  Vaping Use  . Vaping Use: Never used  Substance Use Topics  . Alcohol use:  No  . Drug use: No    Home Medications Prior to Admission medications   Medication Sig Start Date End Date Taking? Authorizing Provider  buPROPion (WELLBUTRIN XL) 150 MG 24 hr tablet TAKE 2 TABLETS DAILY (NEED OFFICE VISIT / VIRTUAL VISIT FOR FURTHER REFILLS) 11/18/19   Ann Held, DO    Allergies    Patient has no known allergies.  Review of Systems   Review of Systems  Cardiovascular: Positive for chest pain and palpitations.  All other systems reviewed and are negative.   Physical Exam Updated Vital Signs BP (!) 115/98   Pulse 76   Temp 98.7 F (37.1 C) (Oral)   Resp 14   Ht 5' (1.524 m)   Wt 54.4 kg   SpO2 98%   BMI 23.44 kg/m   Physical Exam Vitals and nursing note reviewed.  Constitutional:      Appearance: Normal appearance.  HENT:     Head: Normocephalic.     Nose: Nose normal.     Mouth/Throat:     Mouth: Mucous membranes are moist.  Eyes:     Extraocular  Movements: Extraocular movements intact.     Pupils: Pupils are equal, round, and reactive to light.  Cardiovascular:     Rate and Rhythm: Normal rate and regular rhythm.     Pulses: Normal pulses.     Heart sounds: Normal heart sounds.  Pulmonary:     Effort: Pulmonary effort is normal.     Breath sounds: Normal breath sounds.  Abdominal:     General: Abdomen is flat.     Palpations: Abdomen is soft.  Musculoskeletal:        General: Normal range of motion.     Cervical back: Normal range of motion.  Skin:    General: Skin is warm.     Capillary Refill: Capillary refill takes less than 2 seconds.  Neurological:     General: No focal deficit present.     Mental Status: She is alert and oriented to person, place, and time.  Psychiatric:        Mood and Affect: Mood normal.        Behavior: Behavior normal.     ED Results / Procedures / Treatments   Labs (all labs ordered are listed, but only abnormal results are displayed) Labs Reviewed  BASIC METABOLIC PANEL - Abnormal; Notable for the following components:      Result Value   Glucose, Bld 124 (*)    Calcium 10.5 (*)    All other components within normal limits  CBC  TROPONIN I (HIGH SENSITIVITY)  TROPONIN I (HIGH SENSITIVITY)    EKG EKG Interpretation  Date/Time:  Sunday June 27 2020 18:03:35 EDT Ventricular Rate:  89 PR Interval:  184 QRS Duration: 76 QT Interval:  366 QTC Calculation: 445 R Axis:   44 Text Interpretation: Normal sinus rhythm Possible Left atrial enlargement Low voltage QRS Borderline ECG No significant change since last tracing Confirmed by Wandra Arthurs [phone]) on 06/27/2020 8:42:30 PM   Radiology DG Chest 2 View  Result Date: 06/27/2020 CLINICAL DATA:  Tachycardia, chest pain EXAM: CHEST - 2 VIEW COMPARISON:  12/25/2018 FINDINGS: Normal heart size, mediastinal contours, and pulmonary vascularity. Lungs clear. No pulmonary infiltrate, pleural effusion or pneumothorax. Osseous  demineralization. IMPRESSION: No acute abnormalities. Electronically Signed   By: Lavonia Dana M.D.   On: 06/27/2020 18:49    Procedures Procedures (including critical care time)  Medications Ordered in ED  Medications - No data to display  ED Course  I have reviewed the triage vital signs and the nursing notes.  Pertinent labs & imaging results that were available during my care of the patient were reviewed by me and considered in my medical decision making (see chart for details).    MDM Rules/Calculators/A&P                         Ermalee Mealy is a 69 y.o. female who presenting with chest pain and palpitations.  Patient states that she has been very stressed out recently.  Symptoms likely related to anxiety.  Since her pain has been going on for a week, troponin x1 sufficient.  I doubt PE or dissection.  Plan to get CBC, chemistry, troponin, chest x-ray  9:05 PM Labs and troponin and chest x-ray unremarkable.  Reassured patient and patient can follow-up with cardiology if she has persistent chest pain and palpitations   Final Clinical Impression(s) / ED Diagnoses Final diagnoses:  None    Rx / DC Orders ED Discharge Orders    None       Drenda Freeze, MD 06/27/20 2106

## 2020-06-27 NOTE — ED Notes (Signed)
UA sent to lab also

## 2020-06-29 ENCOUNTER — Other Ambulatory Visit: Payer: Self-pay

## 2020-06-29 ENCOUNTER — Ambulatory Visit (INDEPENDENT_AMBULATORY_CARE_PROVIDER_SITE_OTHER): Payer: Medicare Other | Admitting: Medical

## 2020-06-29 VITALS — BP 118/66 | HR 87 | Temp 98.2°F | Resp 18 | Ht 60.0 in | Wt 119.0 lb

## 2020-06-29 DIAGNOSIS — G4700 Insomnia, unspecified: Secondary | ICD-10-CM

## 2020-06-29 DIAGNOSIS — F329 Major depressive disorder, single episode, unspecified: Secondary | ICD-10-CM

## 2020-06-29 DIAGNOSIS — F419 Anxiety disorder, unspecified: Secondary | ICD-10-CM

## 2020-06-29 DIAGNOSIS — F32A Depression, unspecified: Secondary | ICD-10-CM

## 2020-06-29 MED ORDER — BUSPIRONE HCL 7.5 MG PO TABS: 7.5000 mg | ORAL_TABLET | Freq: Two times a day (BID) | ORAL | 0 refills | Status: DC

## 2020-06-29 NOTE — Patient Instructions (Addendum)
You report recent high-level stress and anxiety after hearing distressing family news.  Your mood has decreased since then and you also reports some degree of insomnia.  On discussion of various types of medications percent specifically for anxiety you expressed desire to try BuSpar.  You mentioned that it might have made you a little bit angry in the past but this is unclear.  The other options are benzodiazepines and potentially hydroxyzine.  Neither of those are good options as he had anxiety all day and need to work.  Benzodiazepine and hydroxyzine can cause sedation.  We will prescribe low-dose BuSpar and hopefully will have any side effects.  If you do have any side effects stop medication and notify me.  For depression I want you to continue Wellbutrin.  Mention recent insomnia and will see how you respond to BuSpar.  If over the next week or so he still having insomnia then would add trazodone.  I do not want to add more than one medication at home presently.  You mentioned wanting to see a psychiatrist.  I gave you a list of psychiatrist to call.  If you make contact with 1 and have plans to see them then let me know who you will be seeing and I can place a referral if your insurance or the office requires.  You had recent palpitations and tachycardia along with the anxiety.  Your EKG showed normal sinus rhythm and your lab work-up was negative.  Recommend avoiding any caffeine or any decongestants.  I think the palpitations/tachycardia was anxiety provoked.  But if you have palpitations or tachycardia that is recurrent then could refer you to cardiologist for evaluation/possible Holter monitor.  Follow-up in 2 weeks or as needed.

## 2020-06-29 NOTE — Progress Notes (Signed)
Subjective:    Patient ID: [name] [name], female    DOB: 07-11-1951, 69 y.o.   MRN: [id number]  HPI  Pt in for follow up from ED.  Pt states past 2 weeks she has been anxious. Seemed to coincide with extreme stress related to very stressful family new. She states very anxious since that new. Then over weekend got more anxious. Pt went to ED  Pt states was feeling palpitation and fast hr. Her pulse was 100 around time she went to ED.  Pt bp in ED was 115/98.  ekg in ED read report.   Normal sinus rhythm Possible Left atrial enlargement Low voltage QRS Borderline ECG No significant change since last tracing.  Pt had normal cbc, bmp and troponin. Overall good labs/negative.  Pt states lost 4 lbs since she started to worry. She is depressed about family situation. Just recent decreased appetite.  Pt has been on wellbutrin.  Pt thinks she was on buspar. Pt thought it my have made her angry in past but not sure. She wants to try again. Want to avoid benzo in her age group. She has to go to work and she expresses sedation as side effect. Pt wants to see psychiatrist.     Review of Systems  Constitutional: Negative for chills, fatigue and fever.  Respiratory: Negative for cough and chest tightness.   Cardiovascular: Negative for chest pain and palpitations.  Gastrointestinal: Negative for abdominal pain.  Musculoskeletal: Negative for back pain.  Skin: Negative for rash.  Neurological: Negative for dizziness and light-headedness.  Hematological: Negative for adenopathy. Does not bruise/bleed easily.  Psychiatric/Behavioral: Positive for dysphoric mood and sleep disturbance. Negative for behavioral problems, decreased concentration and suicidal ideas. The patient is nervous/anxious.        Objective:   Physical Exam General- No acute distress.  Appears a bit  anxious and depressed. Neck- Full range of motion, no jvd Lungs- Clear, even and unlabored. Heart- regular rate and rhythm.   No murmurs heard. Neurologic- CNII- XII grossly intact. Symmetric smile, EOMs intact is intact.       Assessment & Plan:  You report recent high-level stress and anxiety after hearing distressing family news.  Your mood has decreased since then and you also reports some degree of insomnia.  On discussion of various types of medications percent specifically for anxiety you expressed desire to try BuSpar.  You mentioned that it might have made you a little bit angry in the past but this is unclear.  The other options are benzodiazepines and potentially hydroxyzine.  Neither of those are good options as he had anxiety all day and need to work.  Benzodiazepine and hydroxyzine can cause sedation.  We will prescribe low-dose BuSpar and hopefully will have any side effects.  If you do have any side effects stop medication and notify me.  For depression I want you to continue Wellbutrin.  Mention recent insomnia and will see how you respond to BuSpar.  If over the next week or so he still having insomnia then would add trazodone.  I do not want to add more than one medication at home presently.  You mentioned wanting to see a psychiatrist.  I gave you a list of psychiatrist to call.  If you make contact with 1 and have plans to see them then let me know who you will be seeing and I can place a referral if your insurance or the office requires.  You had recent palpitations and tachycardia  along with the anxiety.  Your EKG showed normal sinus rhythm and your lab work-up was negative.  Recommend avoiding any caffeine or any decongestants.  I think the palpitations/tachycardia was anxiety provoked.  But if you have palpitations or tachycardia that is recurrent then could refer you to cardiologist for evaluation/possible Holter monitor.  Follow-up in 2 weeks or as needed.

## 2020-07-13 ENCOUNTER — Telehealth (INDEPENDENT_AMBULATORY_CARE_PROVIDER_SITE_OTHER): Payer: Medicare Other | Admitting: Family Medicine

## 2020-07-13 ENCOUNTER — Other Ambulatory Visit: Payer: Self-pay

## 2020-07-13 ENCOUNTER — Encounter: Payer: Self-pay | Admitting: Family Medicine

## 2020-07-13 VITALS — BP 117/77 | HR 89 | Temp 98.2°F | Ht 60.0 in

## 2020-07-13 DIAGNOSIS — F321 Major depressive disorder, single episode, moderate: Secondary | ICD-10-CM

## 2020-07-13 DIAGNOSIS — F418 Other specified anxiety disorders: Secondary | ICD-10-CM | POA: Diagnosis not present

## 2020-07-13 MED ORDER — BUSPIRONE HCL 15 MG PO TABS: 15.0000 mg | ORAL_TABLET | Freq: Two times a day (BID) | ORAL | 2 refills | Status: DC

## 2020-07-13 MED ORDER — BUPROPION HCL ER (XL) 150 MG PO TB24: ORAL_TABLET | ORAL | 2 refills | Status: DC

## 2020-07-13 NOTE — Progress Notes (Signed)
Virtual Visit via Video Note  I connected with [name] [name] on 07/13/20 at  2:20 PM EDT by a video enabled telemedicine application and verified that I am speaking with the correct person using two identifiers.  Location/ persons in visit : Patient: home alone Provider: office    I discussed the limitations of evaluation and management by telemedicine and the availability of in person appointments. The patient expressed understanding and agreed to proceed.  History of Present Illness: Pt is home c/o worsening anxiety / palpitations ----she is willing to see psych for help  No other complaints    Observations/Objective: Vitals:   07/13/20 1419  BP: 117/77  Pulse: 89  Temp: 98.2 F (36.8 C)   Pt is in nad Pt is not suicidal    Assessment and Plan: 1. Depression with anxiety [employer] buspar and dec wellbutrin Did recommend pt call a psych for an app--=-- gave the pt numbers and names of psych to call  - busPIRone (BUSPAR) 15 MG tablet; Take 1 tablet (15 mg total) by mouth 2 (two) times daily.  Dispense: 60 tablet; Refill: 2 - Ambulatory referral to Psychiatry  2. Depression, major, single episode, moderate ([place]) wellbutrin may be contributing to worsening anxiety Dec wellbutrin dose  - buPROPion (WELLBUTRIN XL) 150 MG 24 hr tablet; TAKE 1 po qd  Dispense: 30 tablet; Refill: 2   Follow Up Instructions:   f/u 4-6 weeks  I discussed the assessment and treatment plan with the patient. The patient was provided an opportunity to ask questions and all were answered. The patient agreed with the plan and demonstrated an understanding of the instructions.   The patient was advised to call back or seek an in-person evaluation if the symptoms worsen or if the condition fails to improve as anticipated.  I provided 25 minutes of non-face-to-face time during this encounter.   Ann Held, DO

## 2020-07-20 ENCOUNTER — Telehealth: Payer: Self-pay

## 2020-07-20 NOTE — Telephone Encounter (Signed)
LMOVM asking pt to call office to schedule a 2 week f/up with PCP from her [place] on 07/13/20.  F/up appt can either be in person or a VOV per PCP.

## 2020-08-27 ENCOUNTER — Other Ambulatory Visit: Payer: Self-pay | Admitting: Family Medicine

## 2020-08-27 DIAGNOSIS — Z1231 Encounter for screening mammogram for malignant neoplasm of breast: Secondary | ICD-10-CM

## 2020-09-01 ENCOUNTER — Ambulatory Visit: Payer: Medicare Other

## 2020-09-06 ENCOUNTER — Encounter: Payer: Self-pay | Admitting: Family Medicine

## 2020-09-06 ENCOUNTER — Telehealth: Payer: Self-pay | Admitting: Family Medicine

## 2020-09-06 NOTE — Telephone Encounter (Signed)
Patient states she would like switch to another antidepressant  Mediation.

## 2020-09-06 NOTE — Telephone Encounter (Signed)
I advised patient via Mychart that she needs a OV or at least a virtual to discuss a medication change. She has not viewed that message yet.

## 2020-09-07 ENCOUNTER — Encounter: Payer: Self-pay | Admitting: Family Medicine

## 2020-09-07 ENCOUNTER — Other Ambulatory Visit: Payer: Self-pay

## 2020-09-07 ENCOUNTER — Telehealth (INDEPENDENT_AMBULATORY_CARE_PROVIDER_SITE_OTHER): Payer: Medicare Other | Admitting: Family Medicine

## 2020-09-07 VITALS — BP 123/80 | HR 97 | Ht 60.0 in

## 2020-09-07 DIAGNOSIS — E119 Type 2 diabetes mellitus without complications: Secondary | ICD-10-CM | POA: Diagnosis not present

## 2020-09-07 DIAGNOSIS — Z7189 Other specified counseling: Secondary | ICD-10-CM

## 2020-09-07 DIAGNOSIS — F418 Other specified anxiety disorders: Secondary | ICD-10-CM

## 2020-09-07 MED ORDER — BUSPIRONE HCL 30 MG PO TABS: 30.0000 mg | ORAL_TABLET | Freq: Two times a day (BID) | ORAL | 2 refills | Status: DC

## 2020-09-07 NOTE — Progress Notes (Signed)
Virtual Visit via Video Note  I connected with [name] [name] on 09/07/20 at  3:20 PM EST by a video enabled telemedicine application and verified that I am speaking with the correct person using two identifiers.  Location: Patient: home alone  Provider: office    I discussed the limitations of evaluation and management by telemedicine and the availability of in person appointments. The patient expressed understanding and agreed to proceed.  History of Present Illness: Pt is home alone c/o inc anxiety / depression --- she would like to stop the wellbutrin and is making an app with psych but it may be a while   she also needs labs done  Observations/Objective:   Vitals:   09/07/20 1529  BP: 123/80  Pulse: 97  pt is in nad Pt is not suicidal / homicidal    Assessment and Plan: 1. Depression with anxiety Pt to stop wellbutrin and inc buspar F/u psych - busPIRone (BUSPAR) 30 MG tablet; Take 1 tablet (30 mg total) by mouth 2 (two) times daily.  Dispense: 60 tablet; Refill: 2 - Lipid panel; Future - Hemoglobin A1c; Future - CBC with Differential/Platelet; Future - Comprehensive metabolic panel; Future - TSH; Future  2. Diet-controlled diabetes mellitus ([place]) Check labs next week  - Lipid panel; Future - Hemoglobin A1c; Future - CBC with Differential/Platelet; Future - Comprehensive metabolic panel; Future - TSH; Future  3. covid vaccine ed D/w pt covid vaccine--- risks and benefits  Follow Up Instructions:    I discussed the assessment and treatment plan with the patient. The patient was provided an opportunity to ask questions and all were answered. The patient agreed with the plan and demonstrated an understanding of the instructions.   The patient was advised to call back or seek an in-person evaluation if the symptoms worsen or if the condition fails to improve as anticipated.  I provided 25 minutes of non-face-to-face time during this encounter.   Ann Held, DO

## 2020-09-15 ENCOUNTER — Telehealth: Payer: Self-pay | Admitting: Family Medicine

## 2020-09-15 DIAGNOSIS — E559 Vitamin D deficiency, unspecified: Secondary | ICD-10-CM

## 2020-09-15 NOTE — Telephone Encounter (Signed)
Patient is coming in on  12/07 for labs. She wants to know if Vitamin D can be added to her orders. She wants to know what her Vitamin D level is

## 2020-09-15 NOTE — Telephone Encounter (Signed)
Order placed

## 2020-09-16 ENCOUNTER — Encounter: Payer: Self-pay | Admitting: Family Medicine

## 2020-09-20 ENCOUNTER — Other Ambulatory Visit: Payer: Medicare Other

## 2020-09-21 ENCOUNTER — Other Ambulatory Visit: Payer: Self-pay

## 2020-09-21 ENCOUNTER — Ambulatory Visit: Payer: Medicare Other | Attending: Internal Medicine

## 2020-09-21 ENCOUNTER — Other Ambulatory Visit (INDEPENDENT_AMBULATORY_CARE_PROVIDER_SITE_OTHER): Payer: Medicare Other

## 2020-09-21 ENCOUNTER — Other Ambulatory Visit (HOSPITAL_BASED_OUTPATIENT_CLINIC_OR_DEPARTMENT_OTHER): Payer: Self-pay | Admitting: Internal Medicine

## 2020-09-21 DIAGNOSIS — E119 Type 2 diabetes mellitus without complications: Secondary | ICD-10-CM | POA: Diagnosis not present

## 2020-09-21 DIAGNOSIS — E559 Vitamin D deficiency, unspecified: Secondary | ICD-10-CM

## 2020-09-21 DIAGNOSIS — F418 Other specified anxiety disorders: Secondary | ICD-10-CM | POA: Diagnosis not present

## 2020-09-21 DIAGNOSIS — Z23 Encounter for immunization: Secondary | ICD-10-CM

## 2020-09-21 LAB — CBC WITH DIFFERENTIAL/PLATELET
Basophils Absolute: 0 10*3/uL (ref 0.0–0.1)
Basophils Relative: 0.5 % (ref 0.0–3.0)
Eosinophils Absolute: 0.2 10*3/uL (ref 0.0–0.7)
Eosinophils Relative: 2.9 % (ref 0.0–5.0)
HCT: 43.9 % (ref 36.0–46.0)
Hemoglobin: 14.8 g/dL (ref 12.0–15.0)
Lymphocytes Relative: 33.7 % (ref 12.0–46.0)
Lymphs Abs: 1.8 10*3/uL (ref 0.7–4.0)
MCHC: 33.8 g/dL (ref 30.0–36.0)
MCV: 87.8 fl (ref 78.0–100.0)
Monocytes Absolute: 0.5 10*3/uL (ref 0.1–1.0)
Monocytes Relative: 8.8 % (ref 3.0–12.0)
Neutro Abs: 2.9 10*3/uL (ref 1.4–7.7)
Neutrophils Relative %: 54.1 % (ref 43.0–77.0)
Platelets: 224 10*3/uL (ref 150.0–400.0)
RBC: 5 Mil/uL (ref 3.87–5.11)
RDW: 13 % (ref 11.5–15.5)
WBC: 5.4 10*3/uL (ref 4.0–10.5)

## 2020-09-21 LAB — COMPREHENSIVE METABOLIC PANEL
ALT: 45 U/L — ABNORMAL HIGH (ref 0–35)
AST: 31 U/L (ref 0–37)
Albumin: 4.2 g/dL (ref 3.5–5.2)
Alkaline Phosphatase: 67 U/L (ref 39–117)
BUN: 15 mg/dL (ref 6–23)
CO2: 32 mEq/L (ref 19–32)
Calcium: 10.1 mg/dL (ref 8.4–10.5)
Chloride: 105 mEq/L (ref 96–112)
Creatinine, Ser: 0.86 mg/dL (ref 0.40–1.20)
GFR: 68.79 mL/min (ref >60.00)
Glucose, Bld: 105 mg/dL — ABNORMAL HIGH (ref 70–99)
Potassium: 5 mEq/L (ref 3.5–5.1)
Sodium: 141 mEq/L (ref 135–145)
Total Bilirubin: 0.6 mg/dL (ref 0.2–1.2)
Total Protein: 6.7 g/dL (ref 6.0–8.3)

## 2020-09-21 LAB — TSH: TSH: 2.57 u[IU]/mL (ref 0.35–4.50)

## 2020-09-21 LAB — LIPID PANEL
Cholesterol: 221 mg/dL — ABNORMAL HIGH (ref 0–200)
HDL: 51.4 mg/dL (ref >39.00)
LDL Cholesterol: 153 mg/dL — ABNORMAL HIGH (ref 0–99)
NonHDL: 170.05
Total CHOL/HDL Ratio: 4
Triglycerides: 84 mg/dL (ref 0.0–149.0)
VLDL: 16.8 mg/dL (ref 0.0–40.0)

## 2020-09-21 LAB — HEMOGLOBIN A1C: Hgb A1c MFr Bld: 5.9 % (ref 4.6–6.5)

## 2020-09-21 LAB — VITAMIN D 25 HYDROXY (VIT D DEFICIENCY, FRACTURES): VITD: 59.6 ng/mL (ref 30.00–100.00)

## 2020-09-21 NOTE — Progress Notes (Signed)
   Covid-19 Vaccination Clinic  Name:  [name] [name]    MRN: [id number] DOB: [birth year]  09/21/2020  [name] was observed post Covid-19 immunization for 15 minutes without incident. She was provided with Vaccine Information Sheet and instruction to access the V-Safe system.   [name] was instructed to call 911 with any severe reactions post vaccine: Marland Kitchen Difficulty breathing  . Swelling of face and throat  . A fast heartbeat  . A bad rash all over body  . Dizziness and weakness   Immunizations Administered    Name Date Dose VIS Date Route   JANSSEN COVID-19 VACCINE 09/21/2020 11:22 AM 0.5 mL 08/04/2020 Intramuscular   Manufacturer: [employer]   Lot: [id number]   [place]: [id number]

## 2020-09-28 MED FILL — JANSSEN COVID-19 VACCINE 0.: 0.5 | 1 days supply | Qty: 1 | Fill #0

## 2020-09-30 ENCOUNTER — Encounter: Payer: Self-pay | Admitting: Family Medicine

## 2020-09-30 ENCOUNTER — Telehealth: Payer: Self-pay | Admitting: Family Medicine

## 2020-09-30 DIAGNOSIS — F418 Other specified anxiety disorders: Secondary | ICD-10-CM

## 2020-09-30 MED ORDER — BUSPIRONE HCL 30 MG PO TABS: 30.0000 mg | ORAL_TABLET | Freq: Two times a day (BID) | ORAL | 0 refills | Status: DC

## 2020-09-30 NOTE — Telephone Encounter (Signed)
90 day supply updated 

## 2020-09-30 NOTE — Telephone Encounter (Signed)
Patient is requesting 90 day supply of   busPIRone (BUSPAR) 30 MG tablet [[id number]]  Be sent to her   [facility] [place], [place] to Registered [place] Sites Phone:  [phone]  Fax:  [phone]

## 2020-10-06 ENCOUNTER — Ambulatory Visit: Payer: Medicare Other

## 2020-10-13 ENCOUNTER — Ambulatory Visit: Payer: Medicare Other

## 2020-10-20 MED ORDER — BUSPIRONE HCL 30 MG PO TABS: 30.0000 mg | ORAL_TABLET | Freq: Two times a day (BID) | ORAL | 1 refills | Status: DC

## 2020-10-24 ENCOUNTER — Encounter: Payer: Self-pay | Admitting: Family Medicine

## 2020-10-25 ENCOUNTER — Other Ambulatory Visit: Payer: Self-pay

## 2020-10-25 ENCOUNTER — Other Ambulatory Visit: Payer: Self-pay | Admitting: Family Medicine

## 2020-10-25 DIAGNOSIS — F418 Other specified anxiety disorders: Secondary | ICD-10-CM

## 2020-10-25 DIAGNOSIS — F419 Anxiety disorder, unspecified: Secondary | ICD-10-CM

## 2020-10-25 MED ORDER — BUSPIRONE HCL 30 MG PO TABS: 30.0000 mg | ORAL_TABLET | Freq: Two times a day (BID) | ORAL | 3 refills | Status: DC

## 2020-10-25 MED ORDER — BUSPIRONE HCL 15 MG PO TABS: 15.0000 mg | ORAL_TABLET | Freq: Two times a day (BID) | ORAL | 2 refills | Status: DC

## 2020-10-25 NOTE — Telephone Encounter (Signed)
I sent it to [facility] 

## 2020-11-11 ENCOUNTER — Ambulatory Visit: Payer: Medicare Other

## 2020-12-20 ENCOUNTER — Ambulatory Visit
Admission: RE | Admit: 2020-12-20 | Discharge: 2020-12-20 | Disposition: A | Discharge status: Home / Self Care | Payer: Medicare Other | Source: Ambulatory Visit | Attending: Family Medicine | Admitting: Family Medicine

## 2020-12-20 ENCOUNTER — Other Ambulatory Visit: Payer: Self-pay

## 2020-12-20 DIAGNOSIS — Z1231 Encounter for screening mammogram for malignant neoplasm of breast: Secondary | ICD-10-CM

## 2021-08-11 IMAGING — MG MM DIGITAL SCREENING BILAT W/ TOMO AND CAD
8 series · 8 of 24 positions shown · non-contrast
Comparison: Previous exam(s).

CLINICAL DATA: Screening.

EXAM:
DIGITAL SCREENING BILATERAL MAMMOGRAM WITH TOMOSYNTHESIS AND CAD
TECHNIQUE: Bilateral screening digital craniocaudal and mediolateral oblique
mammograms were obtained. Bilateral screening digital breast
tomosynthesis was performed. The images were evaluated with
computer-aided detection.

[L MLO synth-2D]
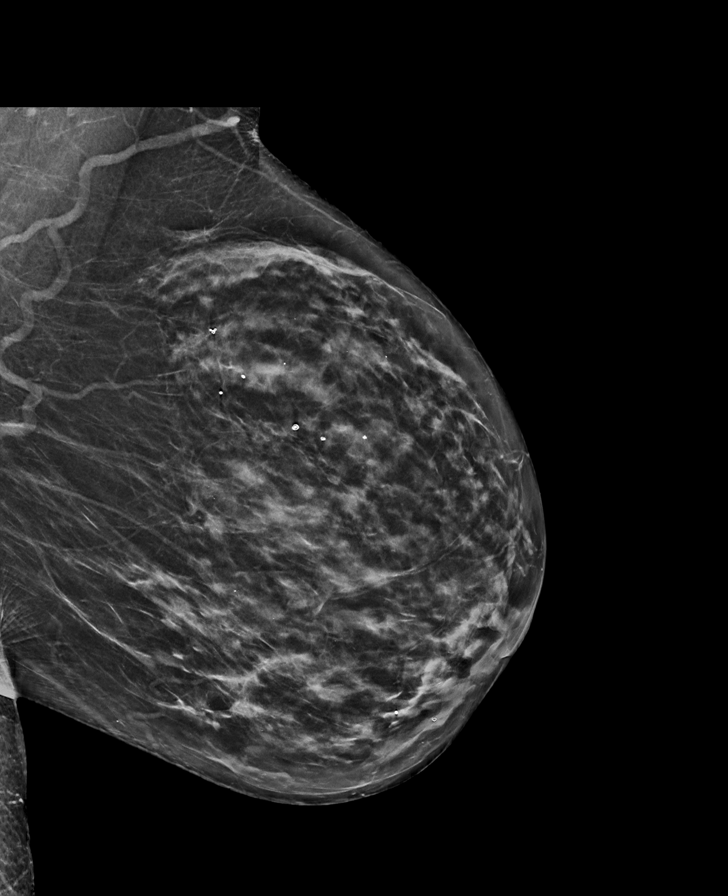

[R MLO synth-2D]
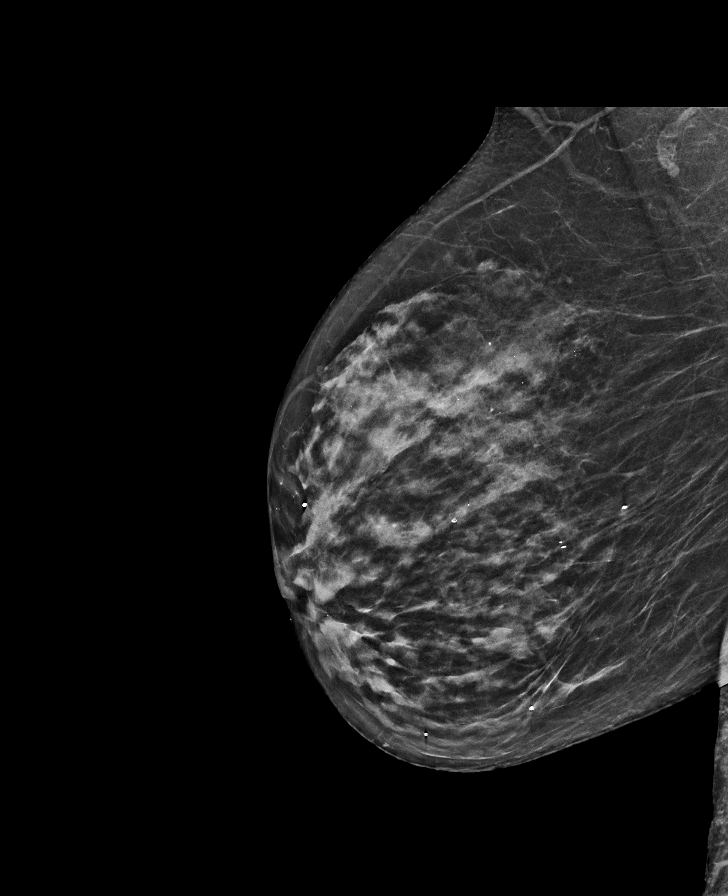

[L CC synth-2D]
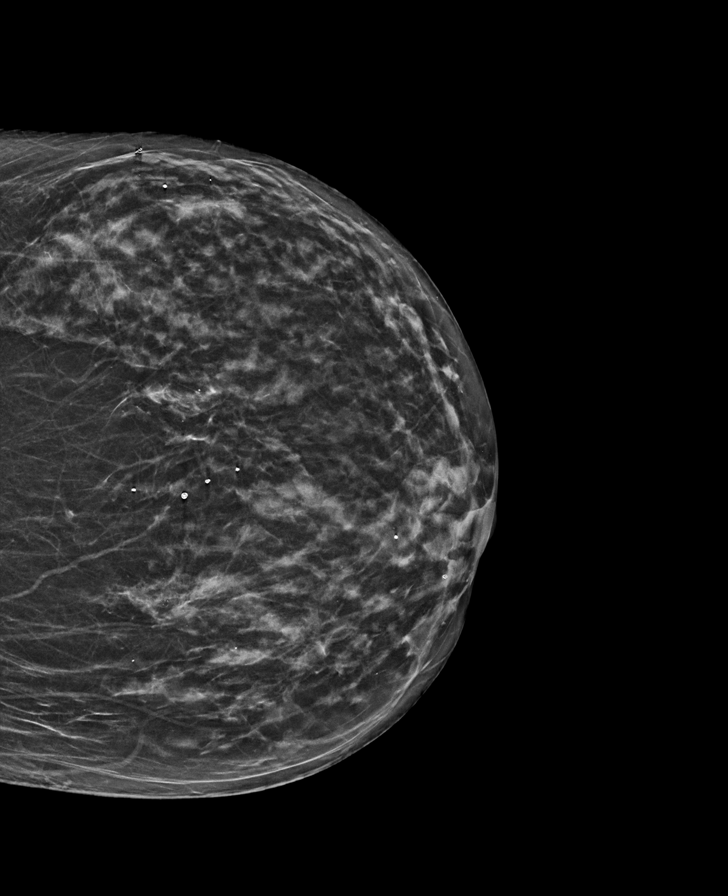

[R CC synth-2D]
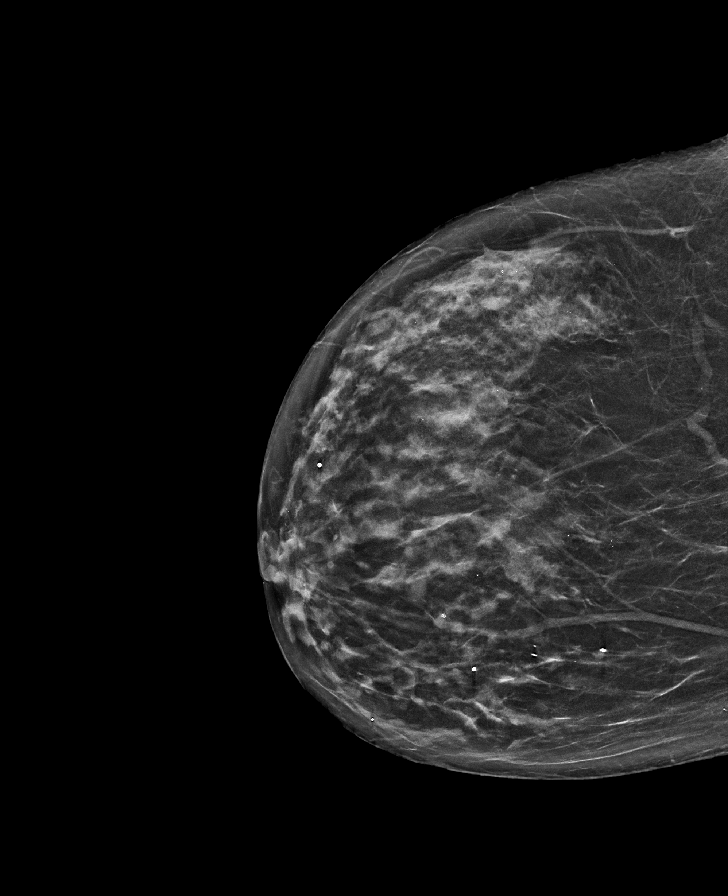

[R CC tomo · tomo slice 29/58.0]
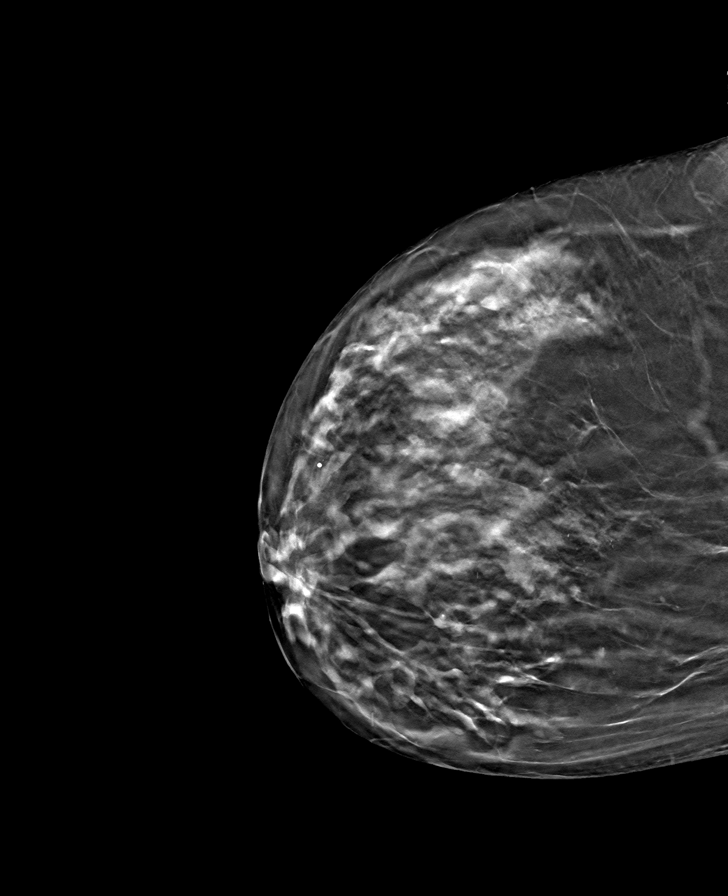

[L MLO tomo · tomo slice 32/63.0]
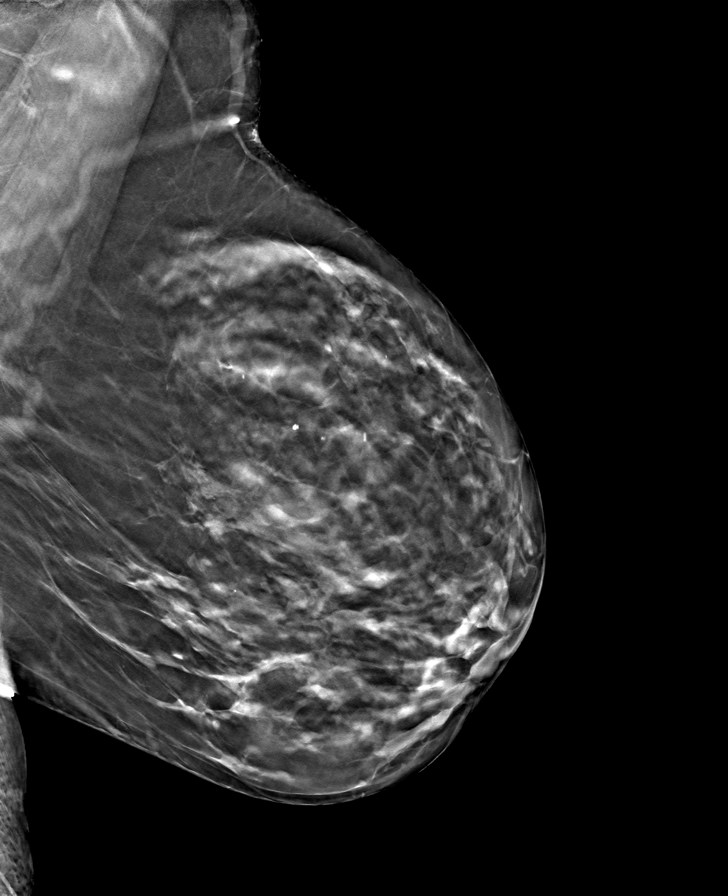

[L CC tomo · tomo slice 28/55.0]
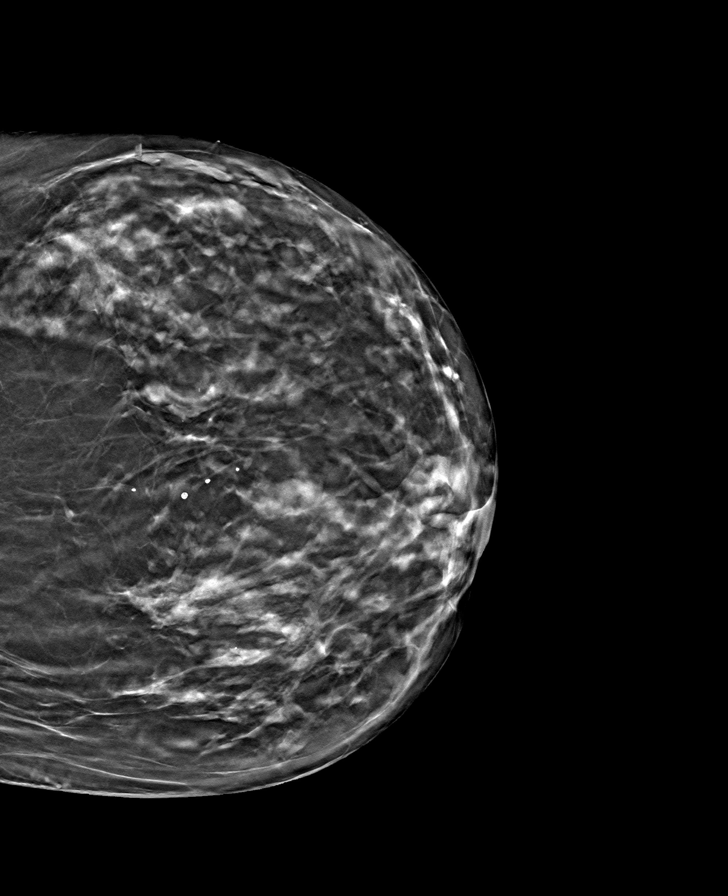

[R MLO tomo · tomo slice 29/58.0]
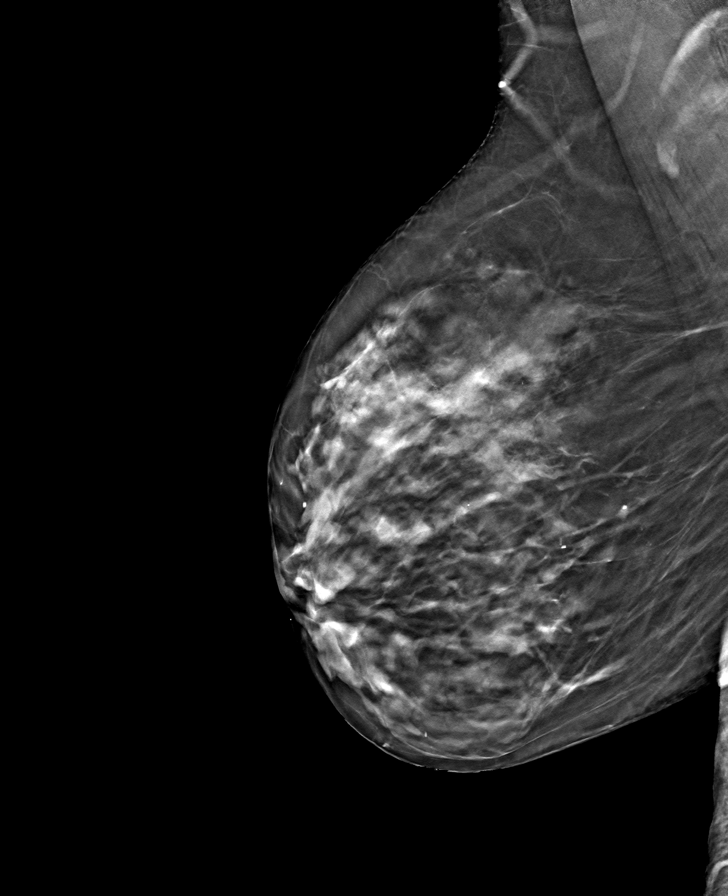

[8 of 24 positions shown; findings below may reference images not displayed]

ACR Breast Density Category c: The breast tissue is heterogeneously
dense, which may obscure small masses.
FINDINGS: There are no findings suspicious for malignancy. The images were
evaluated with computer-aided detection.
IMPRESSION: No mammographic evidence of malignancy. A result letter of this
screening mammogram will be mailed directly to the patient.

RECOMMENDATION:
Screening mammogram in one year. (Code:T4-5-GWO)

BI-RADS CATEGORY  1: Negative.

## 2021-08-12 ENCOUNTER — Encounter: Payer: Self-pay | Admitting: Family Medicine

## 2021-08-12 ENCOUNTER — Other Ambulatory Visit: Payer: Self-pay | Admitting: Family Medicine

## 2021-08-12 DIAGNOSIS — F419 Anxiety disorder, unspecified: Secondary | ICD-10-CM

## 2021-09-06 ENCOUNTER — Encounter: Payer: Self-pay | Admitting: Family Medicine

## 2021-09-06 ENCOUNTER — Other Ambulatory Visit: Payer: Self-pay

## 2021-09-06 ENCOUNTER — Ambulatory Visit (INDEPENDENT_AMBULATORY_CARE_PROVIDER_SITE_OTHER): Payer: Medicare Other | Admitting: Family Medicine

## 2021-09-06 DIAGNOSIS — F419 Anxiety disorder, unspecified: Secondary | ICD-10-CM

## 2021-09-06 MED ORDER — BUSPIRONE HCL 15 MG PO TABS: 15.0000 mg | ORAL_TABLET | Freq: Two times a day (BID) | ORAL | 3 refills | Status: DC

## 2021-09-06 NOTE — Progress Notes (Signed)
Subjective:   By signing my name below, I, Shehryar Baig, attest that this documentation has been prepared under the direction and in the presence of Dr. Roma Schanz, DO. 09/06/2021      Patient ID: [name] [name], female    DOB: 05/17/1951, 70 y.o.   MRN: [id number]  Chief Complaint  Patient presents with   Depression   Anxiety   Follow-up    Depression        Associated symptoms include no headaches.  Past medical history includes anxiety.   Anxiety Symptoms include nervous/anxious behavior. Patient reports no chest pain, dizziness, nausea, palpitations or shortness of breath.    Patient is in today for a office visit.   Anxiety- She continues taking 15 mg Buspar and reports doing well while taking it. She is requesting a refill on it as well.  Immunizations- She declines receiving a flu vaccine during this visit.     Past Medical History:  Diagnosis Date   Achalasia    Anxiety and depression    Diabetes type 2, controlled ([place]) 04/28/2015   Diverticulosis    Dysphagia    GERD (gastroesophageal reflux disease)    Hepatosplenomegaly    Hx of adenomatous colonic polyps 06/13/2019   Hyperlipidemia    Status post dilation of esophageal narrowing     Past Surgical History:  Procedure Laterality Date   COLONOSCOPY     ESOPHAGOGASTRODUODENOSCOPY     LAPAROSCOPIC MYOTOMY      Family History  Problem Relation Age of Onset   Hypertension Mother    Heart disease Father    Depression Father    Colon cancer Neg Hx    Colon polyps Neg Hx    Diabetes Neg Hx    Kidney disease Neg Hx     Social History   Socioeconomic History   Marital status: Married    Spouse name: Not on file   Number of children: 1   Years of education: Not on file   Highest education level: Not on file  Occupational History   Occupation: Statistician    Employer: [employer]  Tobacco Use   Smoking status: Former    Packs/day: 0.20    Years: 5.00    Pack  years: 1.00    Types: Cigarettes    Quit date: 11/01/1979    Years since quitting: 41.8   Smokeless tobacco: Never  Vaping Use   Vaping Use: Never used  Substance and Sexual Activity   Alcohol use: No   Drug use: No   Sexual activity: Not on file  Other Topics Concern   Not on file  Social History Narrative   Married, respiratory therapist at [employer]   She is a [place]   Former smoker no alcohol or drug use   1 child   Social Determinants of Radio broadcast assistant Strain: Not on file  Food Insecurity: Not on file  Transportation Needs: Not on file  Physical Activity: Not on file  Stress: Not on file  Social Connections: Not on file  Intimate Partner Violence: Not on file    Outpatient Medications Prior to Visit  Medication Sig Dispense Refill   busPIRone (BUSPAR) 15 MG tablet Take 1 tablet (15 mg total) by mouth 2 (two) times daily. Pt needs office visit for further refills 60 tablet 0   COVID-19 Ad26 vaccine, JANSSEN/J&J, 0.5 ML injection INJECT AS DIRECTED (Patient not taking: Reported on 09/06/2021) .5 mL 0  No facility-administered medications prior to visit.    No Known Allergies  Review of Systems  Constitutional:  Negative for fever and malaise/fatigue.  HENT:  Negative for congestion.   Eyes:  Negative for blurred vision.  Respiratory:  Negative for shortness of breath.   Cardiovascular:  Negative for chest pain, palpitations and leg swelling.  Gastrointestinal:  Negative for abdominal pain, blood in stool and nausea.  Genitourinary:  Negative for dysuria and frequency.  Musculoskeletal:  Negative for falls.  Skin:  Negative for rash.  Neurological:  Negative for dizziness, loss of consciousness and headaches.  Endo/Heme/Allergies:  Negative for environmental allergies.  Psychiatric/Behavioral:  Negative for depression. The patient is nervous/anxious.       Objective:    Physical Exam Vitals and nursing note reviewed.   Constitutional:      General: She is not in acute distress.    Appearance: Normal appearance. She is not ill-appearing.  HENT:     Head: Normocephalic and atraumatic.     Right Ear: External ear normal.     Left Ear: External ear normal.  Eyes:     Extraocular Movements: Extraocular movements intact.     Pupils: Pupils are equal, round, and reactive to light.  Cardiovascular:     Rate and Rhythm: Normal rate and regular rhythm.     Heart sounds: Normal heart sounds. No murmur heard.   No gallop.  Pulmonary:     Effort: Pulmonary effort is normal. No respiratory distress.     Breath sounds: Normal breath sounds. No wheezing or rales.  Skin:    General: Skin is warm and dry.  Neurological:     Mental Status: She is alert and oriented to person, place, and time.  Psychiatric:        Mood and Affect: Mood normal.        Behavior: Behavior normal.        Thought Content: Thought content normal.        Judgment: Judgment normal.     Comments: Pt doing well with buspar     BP 124/78 (BP Location: Left Arm, Patient Position: Sitting, Cuff Size: Normal)   Pulse 75   Temp 98.1 F (36.7 C) (Oral)   Resp 18   Ht 5' (1.524 m)   Wt 128 lb 6.4 oz (58.2 kg)   SpO2 98%   BMI 25.08 kg/m  Wt Readings from Last 3 Encounters:  09/06/21 128 lb 6.4 oz (58.2 kg)  06/29/20 119 lb (54 kg)  06/27/20 120 lb (54.4 kg)    Diabetic Foot Exam - Simple   No data filed    Lab Results  Component Value Date   WBC 5.4 09/21/2020   HGB 14.8 09/21/2020   HCT 43.9 09/21/2020   PLT 224.0 09/21/2020   GLUCOSE 105 (H) 09/21/2020   CHOL 221 (H) 09/21/2020   TRIG 84.0 09/21/2020   HDL 51.40 09/21/2020   LDLDIRECT 136.0 11/18/2019   LDLCALC 153 (H) 09/21/2020   ALT 45 (H) 09/21/2020   AST 31 09/21/2020   NA 141 09/21/2020   K 5.0 09/21/2020   CL 105 09/21/2020   CREATININE 0.86 09/21/2020   BUN 15 09/21/2020   CO2 32 09/21/2020   TSH 2.57 09/21/2020   INR 1.0 03/11/2015   HGBA1C 5.9  09/21/2020   MICROALBUR 2.2 10/31/2016    Lab Results  Component Value Date   TSH 2.57 09/21/2020   Lab Results  Component Value Date   WBC 5.4  09/21/2020   HGB 14.8 09/21/2020   HCT 43.9 09/21/2020   MCV 87.8 09/21/2020   PLT 224.0 09/21/2020   Lab Results  Component Value Date   NA 141 09/21/2020   K 5.0 09/21/2020   CO2 32 09/21/2020   GLUCOSE 105 (H) 09/21/2020   BUN 15 09/21/2020   CREATININE 0.86 09/21/2020   BILITOT 0.6 09/21/2020   ALKPHOS 67 09/21/2020   AST 31 09/21/2020   ALT 45 (H) 09/21/2020   PROT 6.7 09/21/2020   ALBUMIN 4.2 09/21/2020   CALCIUM 10.1 09/21/2020   ANIONGAP 11 06/27/2020   GFR 68.79 09/21/2020   Lab Results  Component Value Date   CHOL 221 (H) 09/21/2020   Lab Results  Component Value Date   HDL 51.40 09/21/2020   Lab Results  Component Value Date   LDLCALC 153 (H) 09/21/2020   Lab Results  Component Value Date   TRIG 84.0 09/21/2020   Lab Results  Component Value Date   CHOLHDL 4 09/21/2020   Lab Results  Component Value Date   HGBA1C 5.9 09/21/2020       Assessment & Plan:   Problem List Items Addressed This Visit   None Visit Diagnoses     Anxiety       Relevant Medications   busPIRone (BUSPAR) 15 MG tablet        Meds ordered this encounter  Medications   busPIRone (BUSPAR) 15 MG tablet    Sig: Take 1 tablet (15 mg total) by mouth 2 (two) times daily.    Dispense:  180 tablet    Refill:  3    I, Dr. Roma Schanz, DO, personally preformed the services described in this documentation.  All medical record entries made by the scribe were at my direction and in my presence.  I have reviewed the chart and discharge instructions (if applicable) and agree that the record reflects my personal performance and is accurate and complete. 09/06/2021   I,Shehryar Baig,acting as a scribe for Ann Held, DO.,have documented all relevant documentation on the behalf of Ann Held, DO,as  directed by  Ann Held, DO while in the presence of Ann Held, DO.   Ann Held, DO

## 2021-09-06 NOTE — Patient Instructions (Signed)

## 2021-09-06 NOTE — Assessment & Plan Note (Signed)
con't buspar F/u 6 months or sooner prn

## 2021-09-12 ENCOUNTER — Other Ambulatory Visit: Payer: Self-pay | Admitting: Family Medicine

## 2021-09-12 ENCOUNTER — Telehealth: Payer: Self-pay | Admitting: Family Medicine

## 2021-09-12 DIAGNOSIS — E559 Vitamin D deficiency, unspecified: Secondary | ICD-10-CM

## 2021-09-12 DIAGNOSIS — E785 Hyperlipidemia, unspecified: Secondary | ICD-10-CM

## 2021-09-12 DIAGNOSIS — R739 Hyperglycemia, unspecified: Secondary | ICD-10-CM

## 2021-09-12 DIAGNOSIS — T148XXA Other injury of unspecified body region, initial encounter: Secondary | ICD-10-CM

## 2021-09-12 NOTE — Telephone Encounter (Signed)
Patient states she forgot to mention to [facility] on her last OV last week that she needed lab work done due to bruising she's found. She would like her A1C check and vitamin D levels, anything that might explain her bruising. She would like a response through [employer]. Please advice.

## 2021-09-13 ENCOUNTER — Encounter: Payer: Self-pay | Admitting: Family Medicine

## 2021-09-13 NOTE — Telephone Encounter (Signed)
See My chart message

## 2021-09-14 ENCOUNTER — Telehealth: Payer: Self-pay

## 2021-09-14 ENCOUNTER — Other Ambulatory Visit (INDEPENDENT_AMBULATORY_CARE_PROVIDER_SITE_OTHER): Payer: Medicare Other

## 2021-09-14 DIAGNOSIS — E559 Vitamin D deficiency, unspecified: Secondary | ICD-10-CM

## 2021-09-14 DIAGNOSIS — T148XXA Other injury of unspecified body region, initial encounter: Secondary | ICD-10-CM | POA: Diagnosis not present

## 2021-09-14 DIAGNOSIS — E785 Hyperlipidemia, unspecified: Secondary | ICD-10-CM

## 2021-09-14 DIAGNOSIS — R739 Hyperglycemia, unspecified: Secondary | ICD-10-CM | POA: Diagnosis not present

## 2021-09-14 LAB — CBC WITH DIFFERENTIAL/PLATELET
Basophils Absolute: 0 10*3/uL (ref 0.0–0.1)
Basophils Relative: 0.6 % (ref 0.0–3.0)
Eosinophils Absolute: 0.1 10*3/uL (ref 0.0–0.7)
Eosinophils Relative: 1.8 % (ref 0.0–5.0)
HCT: 40.3 % (ref 36.0–46.0)
Hemoglobin: 13.8 g/dL (ref 12.0–15.0)
Lymphocytes Relative: 28.8 % (ref 12.0–46.0)
Lymphs Abs: 1.5 10*3/uL (ref 0.7–4.0)
MCHC: 34.2 g/dL (ref 30.0–36.0)
MCV: 88 fl (ref 78.0–100.0)
Monocytes Absolute: 0.4 10*3/uL (ref 0.1–1.0)
Monocytes Relative: 7.7 % (ref 3.0–12.0)
Neutro Abs: 3.2 10*3/uL (ref 1.4–7.7)
Neutrophils Relative %: 61.1 % (ref 43.0–77.0)
Platelets: 202 10*3/uL (ref 150.0–400.0)
RBC: 4.58 Mil/uL (ref 3.87–5.11)
RDW: 13 % (ref 11.5–15.5)
WBC: 5.2 10*3/uL (ref 4.0–10.5)

## 2021-09-14 LAB — COMPREHENSIVE METABOLIC PANEL
ALT: 73 U/L — ABNORMAL HIGH (ref 0–35)
AST: 48 U/L — ABNORMAL HIGH (ref 0–37)
Albumin: 4.4 g/dL (ref 3.5–5.2)
Alkaline Phosphatase: 67 U/L (ref 39–117)
BUN: 12 mg/dL (ref 6–23)
CO2: 28 mEq/L (ref 19–32)
Calcium: 10.2 mg/dL (ref 8.4–10.5)
Chloride: 102 mEq/L (ref 96–112)
Creatinine, Ser: 0.83 mg/dL (ref 0.40–1.20)
GFR: 71.29 mL/min (ref >60.00)
Glucose, Bld: 110 mg/dL — ABNORMAL HIGH (ref 70–99)
Potassium: 4.6 mEq/L (ref 3.5–5.1)
Sodium: 137 mEq/L (ref 135–145)
Total Bilirubin: 0.6 mg/dL (ref 0.2–1.2)
Total Protein: 6.8 g/dL (ref 6.0–8.3)

## 2021-09-14 LAB — LIPID PANEL
Cholesterol: 214 mg/dL — ABNORMAL HIGH (ref 0–200)
HDL: 47.2 mg/dL (ref >39.00)
LDL Cholesterol: 148 mg/dL — ABNORMAL HIGH (ref 0–99)
NonHDL: 166.31
Total CHOL/HDL Ratio: 5
Triglycerides: 90 mg/dL (ref 0.0–149.0)
VLDL: 18 mg/dL (ref 0.0–40.0)

## 2021-09-14 LAB — VITAMIN D 25 HYDROXY (VIT D DEFICIENCY, FRACTURES): VITD: 117 ng/mL (ref 30.00–100.00)

## 2021-09-14 LAB — PROTIME-INR
INR: 1 ratio (ref 0.8–1.0)
Prothrombin Time: 11.1 s (ref 9.6–13.1)

## 2021-09-14 LAB — TSH: TSH: 1.88 u[IU]/mL (ref 0.35–5.50)

## 2021-09-14 LAB — HEMOGLOBIN A1C: Hgb A1c MFr Bld: 5.9 % (ref 4.6–6.5)

## 2021-09-14 LAB — APTT: aPTT: 30.6 s (ref 23.4–32.7)

## 2021-09-14 NOTE — Telephone Encounter (Signed)
CRITICAL VALUE STICKER  CRITICAL VALUE: Vitamin D 117  RECEIVER (on-site recipient of call):Rockland Kotarski, RMA  DATE & TIME NOTIFIED: 09/14/2021 at 4:55 pm  MESSENGER (representative from lab):  MD NOTIFIED: [place]: 4:57 pm  RESPONSE:

## 2021-09-15 ENCOUNTER — Other Ambulatory Visit: Payer: Self-pay | Admitting: Family Medicine

## 2021-09-15 DIAGNOSIS — E673 Hypervitaminosis D: Secondary | ICD-10-CM

## 2021-09-15 DIAGNOSIS — Z8639 Personal history of other endocrine, nutritional and metabolic disease: Secondary | ICD-10-CM

## 2021-09-15 DIAGNOSIS — E785 Hyperlipidemia, unspecified: Secondary | ICD-10-CM

## 2021-09-15 NOTE — Telephone Encounter (Signed)
See labs 

## 2021-10-24 ENCOUNTER — Telehealth: Payer: Self-pay | Admitting: Family Medicine

## 2021-10-24 NOTE — Telephone Encounter (Signed)
Left message for patient to call back and schedule Medicare Annual Wellness Visit (AWV) in office.  ° °If not able to come in office, please offer to do virtually or by telephone.  Left office number and my jabber #[phone]. ° °Due for AWVI ° °Please schedule at anytime with Nurse Health Advisor. °  °

## 2021-11-23 ENCOUNTER — Telehealth: Payer: Self-pay | Admitting: Family Medicine

## 2021-11-23 NOTE — Telephone Encounter (Signed)
Left message for patient to call back and schedule Medicare Annual Wellness Visit (AWV) in office.  ° °If not able to come in office, please offer to do virtually or by telephone.  Left office number and my jabber #[phone]. ° °Due for AWVI ° °Please schedule at anytime with Nurse Health Advisor. °  °

## 2021-11-29 ENCOUNTER — Ambulatory Visit (INDEPENDENT_AMBULATORY_CARE_PROVIDER_SITE_OTHER): Payer: Medicare Other

## 2021-11-29 VITALS — Ht 60.0 in | Wt 128.0 lb

## 2021-11-29 DIAGNOSIS — Z0000 Encounter for general adult medical examination without abnormal findings: Secondary | ICD-10-CM

## 2021-11-29 NOTE — Patient Instructions (Signed)
[name] [name] , Thank you for taking time to complete  your Medicare Wellness Visit. I appreciate your ongoing commitment to your health goals. Please review the following plan we discussed and let me know if I can assist you in the future.   Screening recommendations/referrals: Colonoscopy: Completed 06/04/2019-Due 06/03/2026 Mammogram: Completed 12/20/2020-Due 12/20/2021 Bone Density: Due-Declined today. Please call the office to schedule if you change your mind. Recommended yearly ophthalmology/optometry visit for glaucoma screening and checkup Recommended yearly dental visit for hygiene and checkup  Vaccinations: Influenza vaccine: Declined Pneumococcal vaccine: Due-May obtain vaccine at our office or your local pharmacy. Tdap vaccine: May obtain vaccine at your local pharmacy. Shingles vaccine:   May obtain vaccine at  your local pharmacy. Covid-19:May obtain Booster at your local pharmacy.  Advanced directives: Information mailed today  Conditions/risks identified: See problem list  Next appointment: Follow up in one year for your annual wellness visit    Preventive Care 65 Years and Older, Female Preventive care refers to lifestyle choices and visits with your health care provider that can promote health and wellness. What does preventive care include? A yearly physical exam. This is also called an annual well check. Dental exams once or twice a year. Routine eye exams. Ask your health care provider how often you should have your eyes checked. Personal lifestyle choices, including: Daily care of your teeth and gums. Regular physical activity. Eating a healthy diet. Avoiding tobacco and drug use. Limiting alcohol use. Practicing safe sex. Taking low-dose aspirin every day. Taking vitamin and mineral supplements as recommended by your health care provider. What happens during an annual well check? The services and screenings done by your health care provider during your annual well  check will depend on your age, overall health, lifestyle risk factors, and family history of disease. Counseling  Your health care provider may ask you questions about your: Alcohol use. Tobacco use. Drug use. Emotional well-being. Home and relationship well-being. Sexual activity. Eating habits. History of falls. Memory and ability to understand (cognition). Work and work Statistician. Reproductive health. Screening  You may have the following tests or measurements: Height, weight, and BMI. Blood pressure. Lipid and cholesterol levels. These may be checked every 5 years, or more frequently if you are over 60 years old. Skin check. Lung cancer screening. You may have this screening every year starting at age 71 if you have a 30-pack-year history of smoking and currently smoke or have quit within the past 15 years. Fecal occult blood test (FOBT) of the stool. You may have this test every year starting at age 71. Flexible sigmoidoscopy or colonoscopy. You may have a sigmoidoscopy every 5 years or a colonoscopy every 10 years starting at age 71. Hepatitis C blood test. Hepatitis B blood test. Sexually transmitted disease (STD) testing. Diabetes screening. This is done by checking your blood sugar (glucose) after you have not eaten for a while (fasting). You may have this done every 1-3 years. Bone density scan. This is done to screen for osteoporosis. You may have this done starting at age 66. Mammogram. This may be done every 1-2 years. Talk to your health care provider about how often you should have regular mammograms. Talk with your health care provider about your test results, treatment options, and if necessary, the need for more tests. Vaccines  Your health care provider may recommend certain vaccines, such as: Influenza vaccine. This is recommended every year. Tetanus, diphtheria, and acellular pertussis (Tdap, Td) vaccine. You may need a Td  booster every 10 years. Zoster  vaccine. You may need this after age 88. Pneumococcal 13-valent conjugate (PCV13) vaccine. One dose is recommended after age 71. Pneumococcal polysaccharide (PPSV23) vaccine. One dose is recommended after age 71. Talk to your health care provider about which screenings and vaccines you need and how often you need them. This information is not intended to replace advice given to you by your health care provider. Make sure you discuss any questions you have with your health care provider. Document Released: 10/29/2015 Document Revised: 06/21/2016 Document Reviewed: 08/03/2015 Elsevier Interactive Patient Education  2017 [place] Prevention in the Home Falls can cause injuries. They can happen to people of all ages. There are many things you can do to make your home safe and to help prevent falls. What can I do on the outside of my home? Regularly fix the edges of walkways and driveways and fix any cracks. Remove anything that might make you trip as you walk through a door, such as a raised step or threshold. Trim any bushes or trees on the path to your home. Use bright outdoor lighting. Clear any walking paths of anything that might make someone trip, such as rocks or tools. Regularly check to see if handrails are loose or broken. Make sure that both sides of any steps have handrails. Any raised decks and porches should have guardrails on the edges. Have any leaves, snow, or ice cleared regularly. Use sand or salt on walking paths during winter. Clean up any spills in your garage right away. This includes oil or grease spills. What can I do in the bathroom? Use night lights. Install grab bars by the toilet and in the tub and shower. Do not use towel bars as grab bars. Use non-skid mats or decals in the tub or shower. If you need to sit down in the shower, use a plastic, non-slip stool. Keep the floor dry. Clean up any water that spills on the floor as soon as it happens. Remove  soap buildup in the tub or shower regularly. Attach bath mats securely with double-sided non-slip rug tape. Do not have throw rugs and other things on the floor that can make you trip. What can I do in the bedroom? Use night lights. Make sure that you have a light by your bed that is easy to reach. Do not use any sheets or blankets that are too big for your bed. They should not hang down onto the floor. Have a firm chair that has side arms. You can use this for support while you get dressed. Do not have throw rugs and other things on the floor that can make you trip. What can I do in the kitchen? Clean up any spills right away. Avoid walking on wet floors. Keep items that you use a lot in easy-to-reach places. If you need to reach something above you, use a strong step stool that has a grab bar. Keep electrical cords out of the way. Do not use floor polish or wax that makes floors slippery. If you must use wax, use non-skid floor wax. Do not have throw rugs and other things on the floor that can make you trip. What can I do with my stairs? Do not leave any items on the stairs. Make sure that there are handrails on both sides of the stairs and use them. Fix handrails that are broken or loose. Make sure that handrails are as long as the stairways. Check any carpeting  to make sure that it is firmly attached to the stairs. Fix any carpet that is loose or worn. Avoid having throw rugs at the top or bottom of the stairs. If you do have throw rugs, attach them to the floor with carpet tape. Make sure that you have a light switch at the top of the stairs and the bottom of the stairs. If you do not have them, ask someone to add them for you. What else can I do to help prevent falls? Wear shoes that: Do not have high heels. Have rubber bottoms. Are comfortable and fit you well. Are closed at the toe. Do not wear sandals. If you use a stepladder: Make sure that it is fully opened. Do not climb a  closed stepladder. Make sure that both sides of the stepladder are locked into place. Ask someone to hold it for you, if possible. Clearly mark and make sure that you can see: Any grab bars or handrails. First and last steps. Where the edge of each step is. Use tools that help you move around (mobility aids) if they are needed. These include: Canes. Walkers. Scooters. Crutches. Turn on the lights when you go into a dark area. Replace any light bulbs as soon as they burn out. Set up your furniture so you have a clear path. Avoid moving your furniture around. If any of your floors are uneven, fix them. If there are any pets around you, be aware of where they are. Review your medicines with your doctor. Some medicines can make you feel dizzy. This can increase your chance of falling. Ask your doctor what other things that you can do to help prevent falls. This information is not intended to replace advice given to you by your health care provider. Make sure you discuss any questions you have with your health care provider. Document Released: 07/29/2009 Document Revised: 03/09/2016 Document Reviewed: 11/06/2014 Elsevier Interactive Patient Education  2017 [employer].

## 2021-11-29 NOTE — Progress Notes (Signed)
Subjective:   [name] [name] is a 71 y.o. female who presents for an Initial Medicare Annual Wellness Visit.  I connected with Jamya today by telephone and verified that I am speaking with the correct person using two identifiers. Location patient: home Location provider: work Persons participating in the virtual visit: patient, Marine scientist.    I discussed the limitations, risks, security and privacy concerns of performing an evaluation and management service by telephone and the availability of in person appointments. I also discussed with the patient that there may be a patient responsible charge related to this service. The patient expressed understanding and verbally consented to this telephonic visit.    Interactive audio and video telecommunications were attempted between this provider and patient, however failed, due to patient having technical difficulties OR patient did not have access to video capability.  We continued and completed visit with audio only.  Some vital signs may be absent or patient reported.   Time Spent with patient on telephone encounter: 20 minutes   Review of Systems     Cardiac Risk Factors include: advanced age (>52men, >26 women);dyslipidemia;diabetes mellitus     Objective:    Today's Vitals   11/29/21 1300  Weight: 128 lb (58.1 kg)  Height: 5' (1.524 m)   Body mass index is 25 kg/m.  Advanced Directives 11/29/2021 06/27/2020 06/04/2019 04/24/2017 12/15/2014 11/11/2012  Does Patient Have a Medical Advance Directive? No No No No No Patient does not have advance directive;Patient would not like information  Would patient like information on creating a medical advance directive? Yes (MAU/Ambulatory/Procedural Areas - Information given) No - Patient declined - - No - patient declined information -  Pre-existing out of facility DNR order (yellow form or pink MOST form) - - - - - No    Current Medications (verified) Outpatient Encounter Medications as of  11/29/2021  Medication Sig   escitalopram (LEXAPRO) 10 MG tablet Take by mouth.   [DISCONTINUED] busPIRone (BUSPAR) 15 MG tablet Take 1 tablet (15 mg total) by mouth 2 (two) times daily.   No facility-administered encounter medications on file as of 11/29/2021.    Allergies (verified) Patient has no known allergies.   History: Past Medical History:  Diagnosis Date   Achalasia    Anxiety and depression    Diabetes type 2, controlled ([place]) 04/28/2015   Diverticulosis    Dysphagia    GERD (gastroesophageal reflux disease)    Hepatosplenomegaly    Hx of adenomatous colonic polyps 06/13/2019   Hyperlipidemia    Status post dilation of esophageal narrowing    Past Surgical History:  Procedure Laterality Date   COLONOSCOPY     ESOPHAGOGASTRODUODENOSCOPY     LAPAROSCOPIC MYOTOMY     Family History  Problem Relation Age of Onset   Hypertension Mother    Heart disease Father    Depression Father    Colon cancer Neg Hx    Colon polyps Neg Hx    Diabetes Neg Hx    Kidney disease Neg Hx    Social History   Socioeconomic History   Marital status: Married    Spouse name: Not on file   Number of children: 1   Years of education: Not on file   Highest education level: Not on file  Occupational History   Occupation: Statistician    Employer: [employer]  Tobacco Use   Smoking status: Former    Packs/day: 0.20    Years: 5.00    Pack years:  1.00    Types: Cigarettes    Quit date: 11/01/1979    Years since quitting: 42.1   Smokeless tobacco: Never  Vaping Use   Vaping Use: Never used  Substance and Sexual Activity   Alcohol use: No   Drug use: No   Sexual activity: Not on file  Other Topics Concern   Not on file  Social History Narrative   Married, respiratory therapist at [employer]   She is a [place]   Former smoker no alcohol or drug use   1 child   Social Determinants of Radio broadcast assistant Strain: Low Risk     Difficulty of Paying Living Expenses: Not hard at all  Food Insecurity: No Food Insecurity   Worried About Charity fundraiser in the Last Year: Never true   Arboriculturist in the Last Year: Never true  Transportation Needs: No Transportation Needs   Lack of Transportation (Medical): No   Lack of Transportation (Non-Medical): No  Physical Activity: Insufficiently Active   Days of Exercise per Week: 2 days   Minutes of Exercise per Session: 30 min  Stress: No Stress Concern Present   Feeling of Stress : Only a little  Social Connections: Moderately Isolated   Frequency of Communication with Friends and Family: Once a week   Frequency of Social Gatherings with Friends and Family: Once a week   Attends Religious Services: More than 4 times per year   Active Member of [employer] or Organizations: No   Attends Music therapist: Never   Marital Status: Married    Tobacco Counseling Counseling given: Not Answered   Clinical Intake:  Pre-visit preparation completed: Yes  Pain : No/denies pain     BMI - recorded: 25 Nutritional Status: BMI 25 -29 Overweight Nutritional Risks: None Diabetes: Yes CBG done?: No Did pt. bring in CBG monitor from home?: No (phone visit)  How often do you need to have someone help you when you read instructions, pamphlets, or other written materials from your doctor or pharmacy?: 1 - Never  Diabetes:  Is the patient diabetic?  Yes  If diabetic, was a CBG obtained today?  No  Did the patient bring in their glucometer from home?  No phone visit How often do you monitor your CBG's? never.   Financial Strains and Diabetes Management:  Are you having any financial strains with the device, your supplies or your medication? No .  Does the patient want to be seen by Chronic Care Management for management of their diabetes?  No  Would the patient like to be referred to a Nutritionist or for Diabetic Management?  No   Diabetic  Exams:  Diabetic Eye Exam: . Completed 10/2021-per patient-Awaiting notes.  Diabetic Foot Exam:  Pt has been advised about the importance in completing this exam. To be completed by PCP.   Interpreter Needed?: No  Information entered by :: Caroleen Hamman LPN   Activities of Daily Living In your present state of health, do you have any difficulty performing the following activities: 11/29/2021 09/06/2021  Hearing? N N  Vision? N N  Difficulty concentrating or making decisions? N N  Walking or climbing stairs? N N  Dressing or bathing? N N  Doing errands, shopping? N N  Preparing Food and eating ? N -  Using the Toilet? N -  In the past six months, have you accidently leaked urine? N -  Do you have problems with loss  of bowel control? N -  Managing your Medications? N -  Managing your Finances? N -  Housekeeping or managing your Housekeeping? N -  Some recent data might be hidden    Patient Care Team: Carollee Herter, Alferd Apa, DO as PCP - General (Family Medicine)  Indicate any recent Medical Services you may have received from other than [facility] providers in the past year (date may be approximate).     Assessment:   This is a routine wellness examination for [name].  Hearing/Vision screen Hearing Screening - Comments:: No issues Vision Screening - Comments:: Last eye exam-10/2021-Dr. Sellers  Dietary issues and exercise activities discussed: Current Exercise Habits: Home exercise routine, Type of exercise: walking, Time (Minutes): 30, Frequency (Times/Week): 2, Weekly Exercise (Minutes/Week): 60, Intensity: Mild, Exercise limited by: None identified   Goals Addressed             This Visit's Progress    Patient Stated       Eat healthy & walk more       Depression Screen PHQ 2/9 Scores 11/29/2021 09/06/2021 07/22/2018 10/31/2016 08/10/2016 01/13/2016  PHQ - 2 Score 0 3 1 0 0 0  PHQ- 9 Score - 6 5 - - -    Fall Risk Fall Risk  11/29/2021 09/06/2021 07/22/2018 10/31/2016  01/13/2016  Falls in the past year? 0 0 No No No  Number falls in past yr: 0 0 - - -  Injury with Fall? 0 0 - - -  Follow up Falls prevention discussed Falls evaluation completed - - -    FALL RISK PREVENTION PERTAINING TO THE HOME:  Any stairs in or around the home? Yes  If so, are there any without handrails? No  Home free of loose throw rugs in walkways, pet beds, electrical cords, etc? Yes  Adequate lighting in your home to reduce risk of falls? Yes   ASSISTIVE DEVICES UTILIZED TO PREVENT FALLS:  Life alert? No  Use of a cane, walker or w/c? No  Grab bars in the bathroom? No  Shower chair or bench in shower? No  Elevated toilet seat or a handicapped toilet? No   TIMED UP AND GO:  Was the test performed? No . Phone visit   Cognitive Function: Normal cognitive status assessed by this Nurse Health Advisor. No abnormalities found.          Immunizations Immunization History  Administered Date(s) Administered   Influenza,inj,Quad PF,6+ Mos 08/28/2016, 08/14/2018, 08/15/2019   Janssen (J&J) SARS-COV-2 Vaccination 09/21/2020    TDAP status: Due, Education has been provided regarding the importance of this vaccine. Advised may receive this vaccine at local pharmacy or Health Dept. Aware to provide a copy of the vaccination record if obtained from local pharmacy or Health Dept. Verbalized acceptance and understanding.  Flu Vaccine status: Declined, Education has been provided regarding the importance of this vaccine but patient still declined. Advised may receive this vaccine at local pharmacy or Health Dept. Aware to provide a copy of the vaccination record if obtained from local pharmacy or Health Dept. Verbalized acceptance and understanding.  Pneumococcal vaccine status: Due, Education has been provided regarding the importance of this vaccine. Advised may receive this vaccine at local pharmacy or Health Dept. Aware to provide a copy of the vaccination record if obtained  from local pharmacy or Health Dept. Verbalized acceptance and understanding.  Covid-19 vaccine status: Information provided on how to obtain vaccines.   Qualifies for Shingles Vaccine? Yes   Zostavax completed No  Shingrix Completed?: No.    Education has been provided regarding the importance of this vaccine. Patient has been advised to call insurance company to determine out of pocket expense if they have not yet received this vaccine. Advised may also receive vaccine at local pharmacy or Health Dept. Verbalized acceptance and understanding.  Screening Tests Health Maintenance  Topic Date Due   FOOT EXAM  Never done   TETANUS/TDAP  Never done   Zoster Vaccines- Shingrix (1 of 2) Never done   Pneumonia Vaccine 31+ Years old (1 - PCV) Never done   DEXA SCAN  Never done   OPHTHALMOLOGY EXAM  04/26/2017   URINE MICROALBUMIN  10/31/2017   COVID-19 Vaccine (2 - Booster for Janssen series) 11/16/2020   INFLUENZA VACCINE  01/13/2022 (Originally 05/16/2021)   MAMMOGRAM  12/20/2021   HEMOGLOBIN A1C  03/14/2022   COLONOSCOPY (Pts 45-41yrs Insurance coverage will need to be confirmed)  06/03/2026   Hepatitis C Screening  Completed   HPV VACCINES  Aged Out    Health Maintenance  Health Maintenance Due  Topic Date Due   FOOT EXAM  Never done   TETANUS/TDAP  Never done   Zoster Vaccines- Shingrix (1 of 2) Never done   Pneumonia Vaccine 73+ Years old (1 - PCV) Never done   DEXA SCAN  Never done   OPHTHALMOLOGY EXAM  04/26/2017   URINE MICROALBUMIN  10/31/2017   COVID-19 Vaccine (2 - Booster for Janssen series) 11/16/2020    Colorectal cancer screening: Type of screening: Colonoscopy. Completed 06/04/2019. Repeat every 7 years  Mammogram status: Completed bilateral 12/20/2020. Repeat every year  Bone Density status: Declined  Lung Cancer Screening: (Low Dose CT Chest recommended if Age 59-80 years, 30 pack-year currently smoking OR have quit w/in 15years.) does not qualify.      Additional Screening:  Hepatitis C Screening: Completed 12/06/2017  Vision Screening: Recommended annual ophthalmology exams for early detection of glaucoma and other disorders of the eye. Is the patient up to date with their annual eye exam?  Yes  Who is the provider or what is the name of the office in which the patient attends annual eye exams? Dr. Tora Perches   Dental Screening: Recommended annual dental exams for proper oral hygiene  Community Resource Referral / Chronic Care Management: CRR required this visit?  No   CCM required this visit?  No      Plan:     I have personally reviewed and noted the following in the patients chart:   Medical and social history Use of alcohol, tobacco or illicit drugs  Current medications and supplements including opioid prescriptions. Patient is not currently taking opioid prescriptions. Functional ability and status Nutritional status Physical activity Advanced directives List of other physicians Hospitalizations, surgeries, and ER visits in previous 12 months Vitals Screenings to include cognitive, depression, and falls Referrals and appointments  In addition, I have reviewed and discussed with patient certain preventive protocols, quality metrics, and best practice recommendations. A written personalized care plan for preventive services as well as general preventive health recommendations were provided to patient.   Due to this being a telephonic visit, the after visit summary with patients personalized plan was offered to patient via mail or my-chart. Patient would like to access on my-chart.   Marta Antu, LPN   [id number]  Nurse Health Advisor  Nurse Notes: None

## 2022-01-26 ENCOUNTER — Ambulatory Visit (INDEPENDENT_AMBULATORY_CARE_PROVIDER_SITE_OTHER): Payer: Medicare Other | Admitting: Internal Medicine

## 2022-01-26 ENCOUNTER — Encounter: Payer: Self-pay | Admitting: Internal Medicine

## 2022-01-26 VITALS — BP 132/72 | HR 78 | Ht 60.0 in | Wt 134.0 lb

## 2022-01-26 DIAGNOSIS — R1319 Other dysphagia: Secondary | ICD-10-CM | POA: Diagnosis not present

## 2022-01-26 DIAGNOSIS — K220 Achalasia of cardia: Secondary | ICD-10-CM

## 2022-01-26 NOTE — Patient Instructions (Signed)
You have been scheduled for an endoscopy. Please follow written instructions given to you at your visit today. ?If you use inhalers (even only as needed), please bring them with you on the day of your procedure. ? ?If you are age 71 or older, your body mass index should be between 23-30. Your Body mass index is 26.17 kg/m?Marland Kitchen If this is out of the aforementioned range listed, please consider follow up with your Primary Care Provider. ? ?If you are age 77 or younger, your body mass index should be between 19-25. Your Body mass index is 26.17 kg/m?Marland Kitchen If this is out of the aformentioned range listed, please consider follow up with your Primary Care Provider.  ? ?________________________________________________________ ? ?The [place] [facility] providers would like to encourage you to use [facility] to communicate with providers for non-urgent requests or questions.  Due to long hold times on the telephone, sending your provider a message by [facility] may be a faster and more efficient way to get a response.  Please allow 48 business hours for a response.  Please remember that this is for non-urgent requests.  ?_______________________________________________________ ? ?I appreciate the opportunity to care for you. ?Silvano Rusk, MD, [facility], [place] ?

## 2022-01-26 NOTE — Progress Notes (Signed)
? ?  [name] [name] 71 y.o. 1950-11-21 [id number] ? ?Assessment & Plan:  ? ?Encounter Diagnoses  ?Name Primary?  ? Achalasia S/P Heller Myotomy Yes  ? Esophageal dysphagia   ? ?Schedule for EGD and repeat dilation of GE junction area.  Diet prior to procedure will include clear liquids the afternoon and evening before and n.p.o. in the morning as usual. ? ? ?Subjective:  ? ?Chief Complaint: ? 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  She is complaining of increasing dysphagia again.  She last had EGD with esophageal dilation to 20 mm at the GE junction in 2020 that was successful reduce his dysphagia.  She is interested in having this done again.  She uses apple cider vinegar for heartburn issues that may occur.  She reports successful treatment of anxiety with Lexapro recently but is concerned because it "makes me hungry and I eat too much". ? ?Wt Readings from Last 3 Encounters:  ?01/26/22 134 lb (60.8 kg)  ?11/29/21 128 lb (58.1 kg)  ?09/06/21 128 lb 6.4 oz (58.2 kg)  ? ? ?No Known Allergies ?Current Meds  ?Medication Sig  ? AMBULATORY NON FORMULARY MEDICATION Magnesium 400 mg suspension: 5 days a week  ? Cholecalciferol (VITAMIN D) 125 MCG (5000 UT) CAPS Take by mouth daily. Several times a week  ? escitalopram (LEXAPRO) 10 MG tablet Take 10 mg by mouth daily.  ? multivitamin-lutein (OCUVITE-LUTEIN) CAPS capsule Take 1 capsule by mouth daily.  ? ?Past Medical History:  ?Diagnosis Date  ? Achalasia   ? Anxiety and depression   ? Diabetes type 2, controlled ([place]) 04/28/2015  ? Diverticulosis   ? Dysphagia   ? GERD (gastroesophageal reflux disease)   ? Hepatosplenomegaly   ? Hx of adenomatous colonic polyps 06/13/2019  ? Hyperlipidemia   ? Status post dilation of esophageal narrowing   ? ?Past Surgical History:  ?Procedure Laterality Date  ? COLONOSCOPY    ? ESOPHAGOGASTRODUODENOSCOPY    ? LAPAROSCOPIC MYOTOMY    ? ?Social History  ? ?Social History Narrative   ? 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 She is a [place]  ? Former smoker no alcohol or drug use  ? 1 child  ? ?family history includes Depression in her father; Heart disease in her father; Hypertension in her mother. ? ? ?Review of Systems ?As above ? ?Objective:  ? Physical Exam ?'@BP'$  132/72   Pulse 78   Ht 5' (1.524 m)   Wt 134 lb (60.8 kg)   SpO2 97%   BMI 26.17 kg/m? @ ? ?General:  NAD ?Eyes:   anicteric ?Lungs:  clear ?Heart::  S1S2 no rubs, murmurs or gallops ?Abdomen:  soft and nontender, BS+ ?Ext:   no edema, cyanosis or clubbing ? ? ? ?Data Reviewed:  ?See above ? ?

## 2022-03-03 ENCOUNTER — Encounter: Payer: Self-pay | Admitting: Certified Registered Nurse Anesthetist

## 2022-03-09 ENCOUNTER — Encounter: Payer: Self-pay | Admitting: Internal Medicine

## 2022-03-09 ENCOUNTER — Ambulatory Visit (AMBULATORY_SURGERY_CENTER): Payer: Medicare Other | Admitting: Internal Medicine

## 2022-03-09 VITALS — BP 130/59 | HR 100 | Temp 98.6°F | Resp 17 | Ht 60.0 in | Wt 134.0 lb

## 2022-03-09 DIAGNOSIS — K220 Achalasia of cardia: Secondary | ICD-10-CM

## 2022-03-09 DIAGNOSIS — K222 Esophageal obstruction: Secondary | ICD-10-CM

## 2022-03-09 DIAGNOSIS — R1319 Other dysphagia: Secondary | ICD-10-CM | POA: Diagnosis not present

## 2022-03-09 MED ORDER — SODIUM CHLORIDE 0.9 % IV SOLN: 500.0000 mL | Freq: Once | INTRAVENOUS | Status: DC

## 2022-03-09 NOTE — Progress Notes (Signed)
Called to room to assist during endoscopic procedure.  Patient ID and intended procedure confirmed with present staff. Received instructions for my participation in the procedure from the performing physician.  

## 2022-03-09 NOTE — Progress Notes (Signed)
[facility] History and Physical   Primary Care Physician:  Carollee Herter, Alferd Apa, DO   Reason for Procedure:   dysphagia  Plan:    EGD and dilate esophagus     HPI: Carolyne Whitsel is a 71 y.o. female here for esophageal dilation   Past Medical History:  Diagnosis Date   Achalasia    Anxiety and depression    Diabetes type 2, controlled ([place]) 04/28/2015   Diverticulosis    Dysphagia    GERD (gastroesophageal reflux disease)    Hepatosplenomegaly    Hx of adenomatous colonic polyps 06/13/2019   Hyperlipidemia    Status post dilation of esophageal narrowing     Past Surgical History:  Procedure Laterality Date   COLONOSCOPY     ESOPHAGOGASTRODUODENOSCOPY     LAPAROSCOPIC MYOTOMY      Prior to Admission medications   Medication Sig Start Date End Date Taking? Authorizing Provider  AMBULATORY NON FORMULARY MEDICATION Magnesium 400 mg suspension: 5 days a week   Yes [provider]  Cholecalciferol (VITAMIN D) 125 MCG (5000 UT) CAPS Take by mouth daily. Several times a week   Yes [provider]  escitalopram (LEXAPRO) 10 MG tablet Take 10 mg by mouth daily. 11/27/21  Yes [provider]  multivitamin-lutein (OCUVITE-LUTEIN) CAPS capsule Take 1 capsule by mouth daily.   Yes [provider]    Current Outpatient Medications  Medication Sig Dispense Refill   AMBULATORY NON FORMULARY MEDICATION Magnesium 400 mg suspension: 5 days a week     Cholecalciferol (VITAMIN D) 125 MCG (5000 UT) CAPS Take by mouth daily. Several times a week     escitalopram (LEXAPRO) 10 MG tablet Take 10 mg by mouth daily.     multivitamin-lutein (OCUVITE-LUTEIN) CAPS capsule Take 1 capsule by mouth daily.     Current Facility-Administered Medications  Medication Dose Route Frequency Provider Last Rate Last Admin   0.9 %  sodium chloride infusion  500 mL Intravenous Once Gatha Mayer, MD        Allergies as of 03/09/2022   (No Known Allergies)     Family History  Problem Relation Age of Onset   Hypertension Mother    Heart disease Father    Depression Father    Colon cancer Neg Hx    Colon polyps Neg Hx    Diabetes Neg Hx    Kidney disease Neg Hx     Social History   Socioeconomic History   Marital status: Married    Spouse name: Not on file   Number of children: 1   Years of education: Not on file   Highest education level: Not on file  Occupational History   Occupation: Respiratory Therapist    Employer: [employer]  Tobacco Use   Smoking status: Former    Packs/day: 0.20    Years: 5.00    Pack years: 1.00    Types: Cigarettes    Quit date: 11/01/1979    Years since quitting: 42.3   Smokeless tobacco: Never  Vaping Use   Vaping Use: Never used  Substance and Sexual Activity   Alcohol use: No   Drug use: No   Sexual activity: Not on file  Other Topics Concern   Not on file  Social History Narrative   Married, 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   She is a [place]   Former smoker no alcohol or drug use   1 child   Social Determinants  of Health   Financial Resource Strain: Low Risk    Difficulty of Paying Living Expenses: Not hard at all  Food Insecurity: No Food Insecurity   Worried About [place] in the Last Year: Never true   [place] in the Last Year: Never true  Transportation Needs: No Transportation Needs   Lack of Transportation (Medical): No   Lack of Transportation (Non-Medical): No  Physical Activity: Insufficiently Active   Days of Exercise per Week: 2 days   Minutes of Exercise per Session: 30 min  Stress: No Stress Concern Present   Feeling of Stress : Only a little  Social Connections: Moderately Isolated   Frequency of Communication with Friends and Family: Once a week   Frequency of Social Gatherings with Friends and Family: Once a week   Attends Religious Services: More than 4 times per year   Active Member of  [employer] or Organizations: No   Attends Archivist Meetings: Never   Marital Status: Married  Human resources officer Violence: Not At Risk   Fear of Current or Ex-Partner: No   Emotionally Abused: No   Physically Abused: No   Sexually Abused: No    Review of Systems:  All other review of systems negative except as mentioned in the HPI.  Physical Exam: Vital signs BP 122/61   Pulse 78   Temp 98.6 F (37 C)   Resp 16   Ht 5' (1.524 m)   Wt 134 lb (60.8 kg)   SpO2 92%   BMI 26.17 kg/m   General:   Alert,  Well-developed, well-nourished, pleasant and cooperative in NAD Lungs:  Clear throughout to auscultation.   Heart:  Regular rate and rhythm; no murmurs, clicks, rubs,  or gallops. Abdomen:  Soft, nontender and nondistended. Normal bowel sounds.   Neuro/Psych:  Alert and cooperative. Normal mood and affect. A and O x 3   '@Jannell Franta'$  Simonne Maffucci, MD, [facility] [phone] (pager) 03/09/2022 1:32 PM@

## 2022-03-09 NOTE — Op Note (Signed)
[place] Patient Name: [name] [name] Procedure Date: 03/09/2022 1:17 PM MRN: [id number] Endoscopist: Gatha Mayer , MD Age: 71 Referring MD:  Date of Birth: 1950-11-19 Gender: Female Account #: [id number] Procedure:                Upper GI endoscopy Indications:              Dysphagia, Stricture of the esophagus, For therapy                            of esophageal stricture, Achalasia, Follow-up of                            achalasia Medicines:                Monitored Anesthesia Care Procedure:                Pre-Anesthesia Assessment:                           - Prior to the procedure, a History and Physical                            was performed, and patient medications and                            allergies were reviewed. The patient's tolerance of                            previous anesthesia was also reviewed. The risks                            and benefits of the procedure and the sedation                            options and risks were discussed with the patient.                            All questions were answered, and informed consent                            was obtained. Prior Anticoagulants: The patient has                            taken no previous anticoagulant or antiplatelet                            agents. ASA Grade Assessment: II - A patient with                            mild systemic disease. After reviewing the risks                            and benefits, the patient was deemed in  satisfactory condition to undergo the procedure.                           After obtaining informed consent, the endoscope was                            passed under direct vision. Throughout the                            procedure, the patient's blood pressure, pulse, and                            oxygen saturations were monitored continuously. The                            GIF [id number] #[id number] was introduced through the                             mouth, and advanced to the second part of duodenum.                            The upper GI endoscopy was accomplished without                            difficulty. The patient tolerated the procedure                            well. Scope In: Scope Out: Findings:                 The lumen of the esophagus was moderately dilated.                           One benign-appearing, intrinsic moderate                            (circumferential scarring or stenosis; an endoscope                            may pass) stenosis was found at the                            gastroesophageal junction. The stenosis was                            traversed. A TTS dilator was passed through the                            scope. Dilation with an 18-19-20 mm balloon dilator                            was performed to 20 mm. The dilation site was                            examined and showed mild  mucosal disruption.                            Estimated blood loss was minimal.                           Evidence of a prior partial fundoplication was                            found in the cardia. This was characterized by                            healthy appearing mucosa.                           The exam was otherwise without abnormality.                           The cardia and gastric fundus were normal on                            retroflexion. Complications:            No immediate complications. Estimated Blood Loss:     Estimated blood loss was minimal. Impression:               - Dilation in the entire esophagus.                           - Benign-appearing esophageal stenosis. Dilated.                           - A partial fundoplication was found, characterized                            by healthy appearing mucosa.                           - The examination was otherwise normal.                           - No specimens collected. Recommendation:           -  Patient has a contact number available for                            emergencies. The signs and symptoms of potential                            delayed complications were discussed with the                            patient. Return to normal activities tomorrow.                            Written discharge instructions were provided to the  patient.                           - Clear liquids x 1 hour then soft foods rest of                            day. Start prior diet tomorrow.                           - Continue present medications. Gatha Mayer, MD 03/09/2022 1:47:34 PM This report has been signed electronically.

## 2022-03-09 NOTE — Patient Instructions (Addendum)
I dilated as before - it went well.  I hope it helps you as in the past.  I appreciate the opportunity to care for you. Handout provided   Clear liquids x 1 hour then soft foods rest of day. Start prior diet tomorrow.  Gatha Mayer, MD, FACG  YOU HAD AN ENDOSCOPIC PROCEDURE TODAY AT [facility]:   Refer to the procedure report that was given to you for any specific questions about what was found during the examination.  If the procedure report does not answer your questions, please call your gastroenterologist to clarify.  If you requested that your care partner not be given the details of your procedure findings, then the procedure report has been included in a sealed envelope for you to review at your convenience later.  YOU SHOULD EXPECT: Some feelings of bloating in the abdomen. Passage of more gas than usual.  Walking can help get rid of the air that was put into your GI tract during the procedure and reduce the bloating. If you had a lower endoscopy (such as a colonoscopy or flexible sigmoidoscopy) you may notice spotting of blood in your stool or on the toilet paper. If you underwent a bowel prep for your procedure, you may not have a normal bowel movement for a few days.  Please Note:  You might notice some irritation and congestion in your nose or some drainage.  This is from the oxygen used during your procedure.  There is no need for concern and it should clear up in a day or so.  SYMPTOMS TO REPORT IMMEDIATELY:  Following upper endoscopy (EGD)  Vomiting of blood or coffee ground material  New chest pain or pain under the shoulder blades  Painful or persistently difficult swallowing  New shortness of breath  Fever of 100F or higher  Black, tarry-looking stools  For urgent or emergent issues, a gastroenterologist can be reached at any hour by calling [phone]. Do not use MyChart messaging for urgent concerns.    DIET:  Clear liquids x 1 hour then  soft foods rest of day. Start prior diet tomorrow.  Drink plenty of fluids but you should avoid alcoholic beverages for 24 hours.  ACTIVITY:  You should plan to take it easy for the rest of today and you should NOT DRIVE or use heavy machinery until tomorrow (because of the sedation medicines used during the test).    FOLLOW UP: Our staff will call the number listed on your records 48-72 hours following your procedure to check on you and address any questions or concerns that you may have regarding the information given to you following your procedure. If we do not reach you, we will leave a message.  We will attempt to reach you two times.  During this call, we will ask if you have developed any symptoms of COVID 19. If you develop any symptoms (ie: fever, flu-like symptoms, shortness of breath, cough etc.) before then, please call [phone].  If you test positive for Covid 19 in the 2 weeks post procedure, please call and report this information to [place].    If any biopsies were taken you will be contacted by phone or by letter within the next 1-3 weeks.  Please call us at [phone] if you have not heard about the biopsies in 3 weeks.    SIGNATURES/CONFIDENTIALITY: You and/or your care partner have signed paperwork which will be entered into your electronic medical record.  These signatures  attest to the fact that that the information above on your After Visit Summary has been reviewed and is understood.  Full responsibility of the confidentiality of this discharge information lies with you and/or your care-partner.

## 2022-03-09 NOTE — Progress Notes (Signed)
Report given to PACU, vss 

## 2022-03-10 ENCOUNTER — Telehealth: Payer: Self-pay | Admitting: *Deleted

## 2022-03-10 NOTE — Telephone Encounter (Signed)
  Follow up Call-     03/09/2022   12:33 PM  Call back number  Post procedure Call Back phone  # [phone]  Permission to leave phone message Yes     Patient questions:  Do you have a fever, pain , or abdominal swelling? No. Pain Score  0 *  Have you tolerated food without any problems? Yes.    Have you been able to return to your normal activities? Yes.    Do you have any questions about your discharge instructions: Diet   No. Medications  No. Follow up visit  No.  Do you have questions or concerns about your Care? No.  Actions: * If pain score is 4 or above: No action needed, pain <4.

## 2022-11-17 ENCOUNTER — Encounter: Payer: Self-pay | Admitting: Family Medicine

## 2022-11-17 ENCOUNTER — Ambulatory Visit (INDEPENDENT_AMBULATORY_CARE_PROVIDER_SITE_OTHER): Payer: Medicare Other | Admitting: Family Medicine

## 2022-11-17 VITALS — BP 140/62 | HR 74 | Temp 97.5°F | Resp 16 | Ht 60.0 in | Wt 132.6 lb

## 2022-11-17 DIAGNOSIS — R1310 Dysphagia, unspecified: Secondary | ICD-10-CM | POA: Diagnosis not present
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
## 2022-11-20 ENCOUNTER — Telehealth: Payer: Self-pay | Admitting: Family Medicine

## 2022-11-20 NOTE — Telephone Encounter (Signed)
Copied from [place] [phone]. Topic: Medicare AWV >> Nov 20, 2022 10:18 AM Devoria Glassing wrote: Reason for CRM: Left message for patient to schedule Annual Wellness Visit(AWV).  Please schedule with Health Nurse Advisor at [facility]. Please call [phone] ask for [facility].

## 2022-11-27 ENCOUNTER — Ambulatory Visit (INDEPENDENT_AMBULATORY_CARE_PROVIDER_SITE_OTHER): Payer: Medicare Other | Admitting: Nurse Practitioner

## 2022-11-27 ENCOUNTER — Encounter: Payer: Self-pay | Admitting: Nurse Practitioner

## 2022-11-27 VITALS — BP 118/62 | HR 65 | Ht 60.0 in | Wt 132.0 lb

## 2022-11-27 DIAGNOSIS — R1319 Other dysphagia: Secondary | ICD-10-CM | POA: Diagnosis not present

## 2022-11-27 NOTE — Patient Instructions (Signed)
You have been scheduled for an endoscopy. Please follow written instructions given to you at your visit today. If you use inhalers (even only as needed), please bring them with you on the day of your procedure.  _______________________________________________________  If your blood pressure at your visit was 140/90 or greater, please contact your primary care physician to follow up on this.  ____________________________________________________  If you are age 75 or older, your body mass index should be between 23-30. Your Body mass index is 25.78 kg/m. If this is out of the aforementioned range listed, please consider follow up with your Primary Care Provider.  If you are age 82 or younger, your body mass index should be between 19-25. Your Body mass index is 25.78 kg/m. If this is out of the aformentioned range listed, please consider follow up with your Primary Care Provider.   ____________________________________________________  The [facility] providers would like to encourage you to use [facility] to communicate with providers for non-urgent requests or questions.  Due to long hold times on the telephone, sending your provider a message by [facility] may be a faster and more efficient way to get a response.  Please allow 48 business hours for a response.  Please remember that this is for non-urgent requests.  ____________________________________________________
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
## 2022-11-29 ENCOUNTER — Telehealth: Payer: Self-pay

## 2022-11-29 NOTE — Telephone Encounter (Signed)
Caller Name Williams Phone Number [phone] Patient Name [name] [name] Patient DOB 08/29/1951 Call Type Message Only Information Provided Reason for Call Request to Reschedule Office Appointment Initial Comment Caller states she needs to reschedule an appt. Disp. Time Disposition Final User 11/29/2022 12:54:34 PM General Information Provided Yes Florina Ou Call Closed By: Florina Ou Transaction Date/Time: 11/29/2022 12:51:39 PM (ET)

## 2022-12-10 ENCOUNTER — Encounter: Payer: Self-pay | Admitting: Certified Registered Nurse Anesthetist

## 2022-12-12 ENCOUNTER — Ambulatory Visit (AMBULATORY_SURGERY_CENTER): Payer: Medicare Other | Admitting: Internal Medicine

## 2022-12-12 ENCOUNTER — Ambulatory Visit: Payer: Medicare Other

## 2022-12-12 ENCOUNTER — Encounter: Payer: Self-pay | Admitting: Internal Medicine

## 2022-12-12 VITALS — BP 119/58 | HR 88 | Temp 96.8°F | Resp 16 | Ht 60.0 in | Wt 132.0 lb

## 2022-12-12 DIAGNOSIS — K222 Esophageal obstruction: Secondary | ICD-10-CM | POA: Diagnosis not present

## 2022-12-12 DIAGNOSIS — K220 Achalasia of cardia: Secondary | ICD-10-CM

## 2022-12-12 DIAGNOSIS — R1319 Other dysphagia: Secondary | ICD-10-CM

## 2022-12-12 MED ORDER — SODIUM CHLORIDE 0.9 % IV SOLN: 500.0000 mL | Freq: Once | INTRAVENOUS | Status: DC

## 2022-12-12 MED ORDER — OMEPRAZOLE 20 MG PO CPDR: 20.0000 mg | DELAYED_RELEASE_CAPSULE | Freq: Every day | ORAL | 3 refills | Status: DC

## 2022-12-12 NOTE — Progress Notes (Unsigned)
Report given to PACU, vss 

## 2022-12-12 NOTE — Progress Notes (Signed)
Called to room to assist during endoscopic procedure.  Patient ID and intended procedure confirmed with present staff. Received instructions for my participation in the procedure from the performing physician.  

## 2022-12-12 NOTE — Patient Instructions (Addendum)
I dilated the gastroesophageal junction area again - there was a stricture. As I said, I suspect GERG causing this. I sent a prescription to your mail order pharmacy for omeprazole - please take every day to try to reduce need to dilate the esophagus.  See post dilation handout for diet recommendation   YOU HAD AN ENDOSCOPIC PROCEDURE TODAY AT [facility]:   Refer to the procedure report that was given to you for any specific questions about what was found during the examination.  If the procedure report does not answer your questions, please call your gastroenterologist to clarify.  If you requested that your care partner not be given the details of your procedure findings, then the procedure report has been included in a sealed envelope for you to review at your convenience later.  YOU SHOULD EXPECT: Some feelings of bloating in the abdomen. Passage of more gas than usual.  Walking can help get rid of the air that was put into your GI tract during the procedure and reduce the bloating. If you had a lower endoscopy (such as a colonoscopy or flexible sigmoidoscopy) you may notice spotting of blood in your stool or on the toilet paper. If you underwent a bowel prep for your procedure, you may not have a normal bowel movement for a few days.  Please Note:  You might notice some irritation and congestion in your nose or some drainage.  This is from the oxygen used during your procedure.  There is no need for concern and it should clear up in a day or so.  SYMPTOMS TO REPORT IMMEDIATELY:  Following upper endoscopy (EGD)  Vomiting of blood or coffee ground material  New chest pain or pain under the shoulder blades  Painful or persistently difficult swallowing  New shortness of breath  Fever of 100F or higher  Black, tarry-looking stools  For urgent or emergent issues, a gastroenterologist can be reached at any hour by calling [phone]. Do not use MyChart messaging for urgent  concerns.    DIET: Clear liquid diet for one hour, until 5:20 pm . Soft diet starting at 5 :20 pm until tomorrow. See post dilation diet handout for recommendations.  .  Drink plenty of fluids but you should avoid alcoholic beverages for 24 hours   ACTIVITY:  You should plan to take it easy for the rest of today and you should NOT DRIVE or use heavy machinery until tomorrow (because of the sedation medicines used during the test).    FOLLOW UP: Our staff will call the number listed on your records the next business day following your procedure.  We will call around 7:15- 8:00 am to check on you and address any questions or concerns that you may have regarding the information given to you following your procedure. If we do not reach you, we will leave a message.     If any biopsies were taken you will be contacted by phone or by letter within the next 1-3 weeks.  Please call us at [phone] if you have not heard about the biopsies in 3 weeks.    SIGNATURES/CONFIDENTIALITY: You and/or your care partner have signed paperwork which will be entered into your electronic medical record.  These signatures attest to the fact that that the information above on your After Visit Summary has been reviewed and is understood.  Full responsibility of the confidentiality of this discharge information lies with you and/or your care-partner.

## 2022-12-12 NOTE — Progress Notes (Signed)
Pt's states no medical or surgical changes since previsit or office visit. 

## 2022-12-12 NOTE — Progress Notes (Unsigned)
History and Physical Interval Note:  12/12/2022 4:03 PM  [name] [name]  has presented today for endoscopic procedure(s), with the diagnosis of  Encounter Diagnosis  Name Primary?   Esophageal dysphagia Yes  .  The various methods of evaluation and treatment have been discussed with the patient and/or family. After consideration of risks, benefits and other options for treatment, the patient has consented to  the endoscopic procedure(s).   The patient's history has been reviewed, patient examined, no change in status, stable for endoscopic procedure(s).  I have reviewed the patient's chart and labs.  Questions were answered to the patient's satisfaction.     Gatha Mayer, MD, [facility]

## 2022-12-12 NOTE — Op Note (Signed)
[facility] Patient Name: [name] [name] Procedure Date: 12/12/2022 3:49 PM MRN: [id number] Endoscopist: Gatha Mayer , MD, [id number] Age: 72 Referring MD:  Date of Birth: 26-Dec-1950 Gender: Female Account #: [id number] Procedure:                Upper GI endoscopy Indications:              Dysphagia, Achalasia Medicines:                Monitored Anesthesia Care Procedure:                Pre-Anesthesia Assessment:                           - Prior to the procedure, a History and Physical                            was performed, and patient medications and                            allergies were reviewed. The patient's tolerance of                            previous anesthesia was also reviewed. The risks                            and benefits of the procedure and the sedation                            options and risks were discussed with the patient.                            All questions were answered, and informed consent                            was obtained. Prior Anticoagulants: The patient has                            taken no anticoagulant or antiplatelet agents. ASA                            Grade Assessment: II - A patient with mild systemic                            disease. After reviewing the risks and benefits,                            the patient was deemed in satisfactory condition to                            undergo the procedure.                           After obtaining informed consent, the endoscope was  passed under direct vision. Throughout the                            procedure, the patient's blood pressure, pulse, and                            oxygen saturations were monitored continuously. The                            Olympus Scope [id number] was introduced through the                            mouth, and advanced to the second part of duodenum.                            The upper GI endoscopy was  accomplished without                            difficulty. The patient tolerated the procedure                            well. Scope In: Scope Out: Findings:                 The lumen of the esophagus was moderately dilated.                            Retained secretions but no food.                           The examined esophagus was moderately tortuous.                           One benign-appearing, intrinsic moderate                            (circumferential scarring or stenosis; an endoscope                            may pass) stenosis was found at the                            gastroesophageal junction. The stenosis was                            traversed. A TTS dilator was passed through the                            scope. Dilation with an 18-19-20 mm balloon dilator                            was performed to 20 mm. The dilation site was                            examined and showed mild mucosal disruption.  Estimated blood loss was minimal.                           Evidence of a prior partial fundoplication was                            found in the cardia.                           The exam was otherwise without abnormality.                           The cardia and gastric fundus were also otherwise                            normal on retroflexion.                           The larynx was normal. Complications:            No immediate complications. Estimated Blood Loss:     Estimated blood loss was minimal. Impression:               - Dilation in the entire esophagus. Some retained                            saliva/secretions, easily cleared                           - Tortuous esophagus.                           - Benign-appearing esophageal stenosis. Dilated.                           - An a partial fundoplication was found.                           - The examination was otherwise normal.                           - No specimens  collected. Recommendation:           - Patient has a contact number available for                            emergencies. The signs and symptoms of potential                            delayed complications were discussed with the                            patient. Return to normal activities tomorrow.                            Written discharge instructions were provided to the  patient.                           - Clear liquids x 1 hour then soft foods rest of                            day. Start prior diet tomorrow.                           - Continue present medications.                           - suspect she has a GERD issue after Heller myotomy                            so will start omepraziole 20 mg daily to try to                            reduce need for esophageal dilation.                           - Return to my office PRN. Gatha Mayer, MD 12/12/2022 4:29:04 PM This report has been signed electronically.

## 2022-12-13 ENCOUNTER — Telehealth: Payer: Self-pay

## 2022-12-13 NOTE — Telephone Encounter (Signed)
Attempted f/u call. NO answer, left VM.

## 2022-12-18 ENCOUNTER — Telehealth: Payer: Self-pay | Admitting: Family Medicine

## 2022-12-18 NOTE — Telephone Encounter (Signed)
Copied from [place] [id number]. Topic: Medicare AWV >> Dec 18, 2022 11:30 AM Devoria Glassing wrote: Reason for CRM: Called patient to schedule Medicare Annual Wellness Visit (AWV). Left message for patient to call back and schedule Medicare Annual Wellness Visit (AWV).  Last date of AWV: 11/29/21  Please schedule an appointment at any time with [facility].  If any questions, please contact me.  Thank you ,  Sherol Dade; [place] Direct Dial: [phone]

## 2022-12-26 ENCOUNTER — Telehealth: Payer: Self-pay | Admitting: Family Medicine

## 2022-12-26 NOTE — Telephone Encounter (Signed)
Copied from [place] [id number]. Topic: Medicare AWV >> Dec 26, 2022 10:22 AM Devoria Glassing wrote: Reason for CRM: Called patient to schedule Medicare Annual Wellness Visit (AWV). Left message for patient to call back and schedule Medicare Annual Wellness Visit (AWV).  Last date of AWV: 11/29/2021  Please schedule an appointment at any time with Beatris Ship, [place] .  If any questions, please contact me.  Thank you ,  Sherol Dade; [place] Direct Dial: [phone]

## 2023-01-02 ENCOUNTER — Ambulatory Visit (INDEPENDENT_AMBULATORY_CARE_PROVIDER_SITE_OTHER): Payer: Medicare Other | Admitting: *Deleted

## 2023-01-02 DIAGNOSIS — Z0000 Encounter for general adult medical examination without abnormal findings: Secondary | ICD-10-CM | POA: Diagnosis not present

## 2023-01-02 NOTE — Progress Notes (Signed)
Subjective:   [name] [name] is a 72 y.o. female who presents for Medicare Annual (Subsequent) preventive examination.  I connected with  [name] [name] on 01/02/23 by a audio enabled telemedicine application and verified that I am speaking with the correct person using two identifiers.  Patient Location: Home  Provider Location: Office/Clinic  I discussed the limitations of evaluation and management by telemedicine. The patient expressed understanding and agreed to proceed.   Review of Systems     Cardiac Risk Factors include: advanced age (>59men, >31 women);dyslipidemia;diabetes mellitus     Objective:    There were no vitals filed for this visit. There is no height or weight on file to calculate BMI.     01/02/2023    1:41 PM 11/29/2021    1:03 PM 06/27/2020    6:09 PM 06/04/2019    9:34 AM 04/24/2017    9:29 AM 12/15/2014    5:57 PM 11/11/2012    3:44 AM  Advanced Directives  Does Patient Have a Medical Advance Directive? No No No No No No Patient does not have advance directive;Patient would not like information  Would patient like information on creating a medical advance directive? No - Patient declined Yes (MAU/Ambulatory/Procedural Areas - Information given) No - Patient declined   No - patient declined information   Pre-existing out of facility DNR order (yellow form or pink MOST form)       No    Current Medications (verified) Outpatient Encounter Medications as of 01/02/2023  Medication Sig   AMBULATORY NON FORMULARY MEDICATION Magnesium 400 mg suspension: 5 days a week   Cholecalciferol (VITAMIN D) 125 MCG (5000 UT) CAPS Take by mouth daily. Several times a week   DULoxetine (CYMBALTA) 30 MG capsule Take 30 mg by mouth daily.   escitalopram (LEXAPRO) 10 MG tablet Take 10 mg by mouth daily.   multivitamin-lutein (OCUVITE-LUTEIN) CAPS capsule Take 1 capsule by mouth daily.   omeprazole (PRILOSEC) 20 MG capsule Take 1 capsule (20 mg total) by mouth daily.   No  facility-administered encounter medications on file as of 01/02/2023.    Allergies (verified) Patient has no known allergies.   History: Past Medical History:  Diagnosis Date   Achalasia    Anxiety and depression    Diabetes type 2, controlled ([place]) 04/28/2015   Diverticulosis    Dysphagia    GERD (gastroesophageal reflux disease)    Hepatosplenomegaly    Hx of adenomatous colonic polyps 06/13/2019   Hyperlipidemia    Status post dilation of esophageal narrowing    Past Surgical History:  Procedure Laterality Date   COLONOSCOPY     ESOPHAGOGASTRODUODENOSCOPY     LAPAROSCOPIC MYOTOMY     Family History  Problem Relation Age of Onset   Hypertension Mother    Heart disease Father    Depression Father    Colon cancer Neg Hx    Colon polyps Neg Hx    Diabetes Neg Hx    Kidney disease Neg Hx    Social History   Socioeconomic History   Marital status: Married    Spouse name: Not on file   Number of children: 1   Years of education: Not on file   Highest education level: Not on file  Occupational History   Occupation: Statistician    Employer: [employer]  Tobacco Use   Smoking status: Former    Packs/day: 0.20    Years: 5.00    Additional pack years: 0.00  Total pack years: 1.00    Types: Cigarettes    Quit date: 11/01/1979    Years since quitting: 43.2   Smokeless tobacco: Never  Vaping Use   Vaping Use: Never used  Substance and Sexual Activity   Alcohol use: No   Drug use: No   Sexual activity: Not on file  Other Topics Concern   Not on file  Social History Narrative   Married, 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   She is a [place]   Former smoker no alcohol or drug use   1 child   Social Determinants of Radio broadcast assistant Strain: Low Risk  (11/29/2021)   Overall Financial Resource Strain (CARDIA)    Difficulty of Paying Living Expenses: Not hard at all  Food Insecurity: No Food  Insecurity (01/02/2023)   Hunger Vital Sign    Worried About Running Out of Food in the Last Year: Never true    [place] in the Last Year: Never true  Transportation Needs: No Transportation Needs (01/02/2023)   PRAPARE - Hydrologist (Medical): No    Lack of Transportation (Non-Medical): No  Physical Activity: Insufficiently Active (11/29/2021)   Exercise Vital Sign    Days of Exercise per Week: 2 days    Minutes of Exercise per Session: 30 min  Stress: No Stress Concern Present (11/29/2021)   [place]    Feeling of Stress : Only a little  Social Connections: Moderately Isolated (11/29/2021)   Social Connection and Isolation Panel [NHANES]    Frequency of Communication with Friends and Family: Once a week    Frequency of Social Gatherings with Friends and Family: Once a week    Attends Religious Services: More than 4 times per year    Active Member of [employer] or Organizations: No    Attends Music therapist: Never    Marital Status: Married    Tobacco Counseling Counseling given: Not Answered   Clinical Intake:  Pre-visit preparation completed: Yes  Pain : No/denies pain  Nutritional Risks: None Diabetes: Yes CBG done?: No Did pt. bring in CBG monitor from home?: No  How often do you need to have someone help you when you read instructions, pamphlets, or other written materials from your doctor or pharmacy?: 1 - Never  Activities of Daily Living    01/02/2023    1:44 PM  In your present state of health, do you have any difficulty performing the following activities:  Hearing? 0  Vision? 0  Difficulty concentrating or making decisions? 0  Walking or climbing stairs? 0  Dressing or bathing? 0  Doing errands, shopping? 0  Preparing Food and eating ? N  Using the Toilet? N  In the past six months, have you accidently leaked urine? N  Do you have problems  with loss of bowel control? N  Managing your Medications? N  Managing your Finances? N  Housekeeping or managing your Housekeeping? N    Patient Care Team: Carollee Herter, Alferd Apa, DO as PCP - General (Family Medicine)  Indicate any recent Medical Services you may have received from other than [facility] providers in the past year (date may be approximate).     Assessment:   This is a routine wellness examination for [name] [name].  Hearing/Vision screen No results found.  Dietary issues and exercise activities discussed: Current Exercise Habits: Home exercise routine, Type of exercise: walking,  Time (Minutes): 40, Frequency (Times/Week): 3, Weekly Exercise (Minutes/Week): 120, Intensity: Mild, Exercise limited by: None identified   Goals Addressed   None    Depression Screen    01/02/2023    1:43 PM 11/17/2022   12:32 PM 11/29/2021    1:06 PM 09/06/2021   10:26 AM 07/22/2018    2:12 PM 10/31/2016    1:55 PM 08/10/2016    2:26 PM  PHQ 2/9 Scores  PHQ - 2 Score 0 0 0 3 1 0 0  PHQ- 9 Score    6 5      Fall Risk    01/02/2023    1:42 PM 11/17/2022   12:32 PM 11/29/2021    1:05 PM 09/06/2021   10:24 AM 07/22/2018    2:12 PM  [place] in the past year? 0 0 0 0 No  Number falls in past yr: 0 0 0 0   Injury with Fall? 0 0 0 0   Risk for fall due to : No Fall Risks      Follow up Falls evaluation completed Falls evaluation completed Falls prevention discussed Falls evaluation completed     [place]:  Any stairs in or around the home? Yes  If so, are there any without handrails? No  Home free of loose throw rugs in walkways, pet beds, electrical cords, etc? Yes  Adequate lighting in your home to reduce risk of falls? Yes   ASSISTIVE DEVICES UTILIZED TO PREVENT FALLS:  Life alert? No  Use of a cane, walker or w/c? No  Grab bars in the bathroom? No  Shower chair or bench in shower? No  Elevated toilet seat or a handicapped toilet? No   TIMED  UP AND GO:  Was the test performed?  No, audio visit .    Cognitive Function:        01/02/2023    1:47 PM  6CIT Screen  What Year? 0 points  What month? 0 points  What time? 0 points  Count back from 20 0 points  Months in reverse 0 points  Repeat phrase 0 points  Total Score 0 points    Immunizations Immunization History  Administered Date(s) Administered   Influenza,inj,Quad PF,6+ Mos 08/28/2016, 08/14/2018, 08/15/2019   Janssen (J&J) SARS-COV-2 Vaccination 09/21/2020    TDAP status: Due, Education has been provided regarding the importance of this vaccine. Advised may receive this vaccine at local pharmacy or Health Dept. Aware to provide a copy of the vaccination record if obtained from local pharmacy or Health Dept. Verbalized acceptance and understanding.  Flu Vaccine status: Declined, Education has been provided regarding the importance of this vaccine but patient still declined. Advised may receive this vaccine at local pharmacy or Health Dept. Aware to provide a copy of the vaccination record if obtained from local pharmacy or Health Dept. Verbalized acceptance and understanding.  Pneumococcal vaccine status: Due, Education has been provided regarding the importance of this vaccine. Advised may receive this vaccine at local pharmacy or Health Dept. Aware to provide a copy of the vaccination record if obtained from local pharmacy or Health Dept. Verbalized acceptance and understanding.  Covid-19 vaccine status: Information provided on how to obtain vaccines.   Qualifies for Shingles Vaccine? Yes   Zostavax completed No   Shingrix Completed?: No.    Education has been provided regarding the importance of this vaccine. Patient has been advised to call insurance company to determine out of  pocket expense if they have not yet received this vaccine. Advised may also receive vaccine at local pharmacy or Health Dept. Verbalized acceptance and understanding.  Screening  Tests Health Maintenance  Topic Date Due   FOOT EXAM  Never done   DTaP/Tdap/Td (1 - Tdap) Never done   Zoster Vaccines- Shingrix (1 of 2) Never done   Pneumonia Vaccine 54+ Years old (1 of 1 - PCV) Never done   DEXA SCAN  Never done   Diabetic kidney evaluation - Urine ACR  04/27/2016   OPHTHALMOLOGY EXAM  04/26/2017   COVID-19 Vaccine (2 - Janssen risk series) 10/19/2020   MAMMOGRAM  12/20/2021   HEMOGLOBIN A1C  03/14/2022   Diabetic kidney evaluation - eGFR measurement  09/14/2022   Medicare Annual Wellness (AWV)  11/29/2022   INFLUENZA VACCINE  01/14/2023 (Originally 05/16/2022)   COLONOSCOPY (Pts 45-82yrs Insurance coverage will need to be confirmed)  06/03/2026   Hepatitis C Screening  Completed   HPV VACCINES  Aged Out    Health Maintenance  Health Maintenance Due  Topic Date Due   FOOT EXAM  Never done   DTaP/Tdap/Td (1 - Tdap) Never done   Zoster Vaccines- Shingrix (1 of 2) Never done   Pneumonia Vaccine 78+ Years old (1 of 1 - PCV) Never done   DEXA SCAN  Never done   Diabetic kidney evaluation - Urine ACR  04/27/2016   OPHTHALMOLOGY EXAM  04/26/2017   COVID-19 Vaccine (2 - Janssen risk series) 10/19/2020   MAMMOGRAM  12/20/2021   HEMOGLOBIN A1C  03/14/2022   Diabetic kidney evaluation - eGFR measurement  09/14/2022   Medicare Annual Wellness (AWV)  11/29/2022    Colorectal cancer screening: Type of screening: Colonoscopy. Completed 06/04/19. Repeat every 7 years  Mammogram status: Completed 12/20/20. Repeat every year  Bone Density status: Pt declined.  Lung Cancer Screening: (Low Dose CT Chest recommended if Age 8-80 years, 30 pack-year currently smoking OR have quit w/in 15years.) does not qualify.   Additional Screening:  Hepatitis C Screening: does qualify; Completed 12/06/17  Vision Screening: Recommended annual ophthalmology exams for early detection of glaucoma and other disorders of the eye. Is the patient up to date with their annual eye exam?  Yes   Who is the provider or what is the name of the office in which the patient attends annual eye exams? Dr. Tora Perches If pt is not established with a provider, would they like to be referred to a provider to establish care? No .   Dental Screening: Recommended annual dental exams for proper oral hygiene  Community Resource Referral / Chronic Care Management: CRR required this visit?  No   CCM required this visit?  No      Plan:     I have personally reviewed and noted the following in the patient's chart:   Medical and social history Use of alcohol, tobacco or illicit drugs  Current medications and supplements including opioid prescriptions. Patient is not currently taking opioid prescriptions. Functional ability and status Nutritional status Physical activity Advanced directives List of other physicians Hospitalizations, surgeries, and ER visits in previous 12 months Vitals Screenings to include cognitive, depression, and falls Referrals and appointments  In addition, I have reviewed and discussed with patient certain preventive protocols, quality metrics, and best practice recommendations. A written personalized care plan for preventive services as well as general preventive health recommendations were provided to patient.   Due to this being a telephonic visit, the after visit summary with  patients personalized plan was offered to patient via mail or my-chart. Patient would like to access on my-chart.  Beatris Ship, [place]   01/02/2023   Nurse Notes: None

## 2023-01-02 NOTE — Patient Instructions (Signed)
[name] [name] , Thank you for taking time to come for your Medicare Wellness Visit. I appreciate your ongoing commitment to your health goals. Please review the following plan we discussed and let me know if I can assist you in the future.     This is a list of the screening recommended for you and due dates:  Health Maintenance  Topic Date Due   Complete foot exam   Never done   DTaP/Tdap/Td vaccine (1 - Tdap) Never done   Zoster (Shingles) Vaccine (1 of 2) Never done   Pneumonia Vaccine (1 of 1 - PCV) Never done   DEXA scan (bone density measurement)  Never done   Yearly kidney health urinalysis for diabetes  04/27/2016   Eye exam for diabetics  04/26/2017   COVID-19 Vaccine (2 - Janssen risk series) 10/19/2020   Mammogram  12/20/2021   Hemoglobin A1C  03/14/2022   Yearly kidney function blood test for diabetes  09/14/2022   Flu Shot  72/31/2024*   Medicare Annual Wellness Visit  01/02/2024   Colon Cancer Screening  06/03/2026   Hepatitis C Screening: USPSTF Recommendation to screen - Ages 18-79 yo.  Completed   HPV Vaccine  Aged Out  *Topic was postponed. The date shown is not the original due date.     Next appointment: Follow up in one year for your annual wellness visit.   Preventive Care 32 Years and Older, Female Preventive care refers to lifestyle choices and visits with your health care provider that can promote health and wellness. What does preventive care include? A yearly physical exam. This is also called an annual well check. Dental exams once or twice a year. Routine eye exams. Ask your health care provider how often you should have your eyes checked. Personal lifestyle choices, including: Daily care of your teeth and gums. Regular physical activity. Eating a healthy diet. Avoiding tobacco and drug use. Limiting alcohol use. Practicing safe sex. Taking low-dose aspirin every day. Taking vitamin and mineral supplements as recommended by your health care  provider. What happens during an annual well check? The services and screenings done by your health care provider during your annual well check will depend on your age, overall health, lifestyle risk factors, and family history of disease. Counseling  Your health care provider may ask you questions about your: Alcohol use. Tobacco use. Drug use. Emotional well-being. Home and relationship well-being. Sexual activity. Eating habits. History of falls. Memory and ability to understand (cognition). Work and work Statistician. Reproductive health. Screening  You may have the following tests or measurements: Height, weight, and BMI. Blood pressure. Lipid and cholesterol levels. These may be checked every 5 years, or more frequently if you are over 25 years old. Skin check. Lung cancer screening. You may have this screening every year starting at age 8 if you have a 30-pack-year history of smoking and currently smoke or have quit within the past 15 years. Fecal occult blood test (FOBT) of the stool. You may have this test every year starting at age 37. Flexible sigmoidoscopy or colonoscopy. You may have a sigmoidoscopy every 5 years or a colonoscopy every 10 years starting at age 84. Hepatitis C blood test. Hepatitis B blood test. Sexually transmitted disease (STD) testing. Diabetes screening. This is done by checking your blood sugar (glucose) after you have not eaten for a while (fasting). You may have this done every 1-3 years. Bone density scan. This is done to screen for osteoporosis. You may have  this done starting at age 58. Mammogram. This may be done every 1-2 years. Talk to your health care provider about how often you should have regular mammograms. Talk with your health care provider about your test results, treatment options, and if necessary, the need for more tests. Vaccines  Your health care provider may recommend certain vaccines, such as: Influenza vaccine. This is  recommended every year. Tetanus, diphtheria, and acellular pertussis (Tdap, Td) vaccine. You may need a Td booster every 10 years. Zoster vaccine. You may need this after age 61. Pneumococcal 13-valent conjugate (PCV13) vaccine. One dose is recommended after age 23. Pneumococcal polysaccharide (PPSV23) vaccine. One dose is recommended after age 51. Talk to your health care provider about which screenings and vaccines you need and how often you need them. This information is not intended to replace advice given to you by your health care provider. Make sure you discuss any questions you have with your health care provider. Document Released: 10/29/2015 Document Revised: 06/21/2016 Document Reviewed: 08/03/2015 Elsevier Interactive Patient Education  2017 [place] Prevention in the Home Falls can cause injuries. They can happen to people of all ages. There are many things you can do to make your home safe and to help prevent falls. What can I do on the outside of my home? Regularly fix the edges of walkways and driveways and fix any cracks. Remove anything that might make you trip as you walk through a door, such as a raised step or threshold. Trim any bushes or trees on the path to your home. Use bright outdoor lighting. Clear any walking paths of anything that might make someone trip, such as rocks or tools. Regularly check to see if handrails are loose or broken. Make sure that both sides of any steps have handrails. Any raised decks and porches should have guardrails on the edges. Have any leaves, snow, or ice cleared regularly. Use sand or salt on walking paths during winter. Clean up any spills in your garage right away. This includes oil or grease spills. What can I do in the bathroom? Use night lights. Install grab bars by the toilet and in the tub and shower. Do not use towel bars as grab bars. Use non-skid mats or decals in the tub or shower. If you need to sit down in  the shower, use a plastic, non-slip stool. Keep the floor dry. Clean up any water that spills on the floor as soon as it happens. Remove soap buildup in the tub or shower regularly. Attach bath mats securely with double-sided non-slip rug tape. Do not have throw rugs and other things on the floor that can make you trip. What can I do in the bedroom? Use night lights. Make sure that you have a light by your bed that is easy to reach. Do not use any sheets or blankets that are too big for your bed. They should not hang down onto the floor. Have a firm chair that has side arms. You can use this for support while you get dressed. Do not have throw rugs and other things on the floor that can make you trip. What can I do in the kitchen? Clean up any spills right away. Avoid walking on wet floors. Keep items that you use a lot in easy-to-reach places. If you need to reach something above you, use a strong step stool that has a grab bar. Keep electrical cords out of the way. Do not use floor polish or  wax that makes floors slippery. If you must use wax, use non-skid floor wax. Do not have throw rugs and other things on the floor that can make you trip. What can I do with my stairs? Do not leave any items on the stairs. Make sure that there are handrails on both sides of the stairs and use them. Fix handrails that are broken or loose. Make sure that handrails are as long as the stairways. Check any carpeting to make sure that it is firmly attached to the stairs. Fix any carpet that is loose or worn. Avoid having throw rugs at the top or bottom of the stairs. If you do have throw rugs, attach them to the floor with carpet tape. Make sure that you have a light switch at the top of the stairs and the bottom of the stairs. If you do not have them, ask someone to add them for you. What else can I do to help prevent falls? Wear shoes that: Do not have high heels. Have rubber bottoms. Are comfortable  and fit you well. Are closed at the toe. Do not wear sandals. If you use a stepladder: Make sure that it is fully opened. Do not climb a closed stepladder. Make sure that both sides of the stepladder are locked into place. Ask someone to hold it for you, if possible. Clearly mark and make sure that you can see: Any grab bars or handrails. First and last steps. Where the edge of each step is. Use tools that help you move around (mobility aids) if they are needed. These include: Canes. Walkers. Scooters. Crutches. Turn on the lights when you go into a dark area. Replace any light bulbs as soon as they burn out. Set up your furniture so you have a clear path. Avoid moving your furniture around. If any of your floors are uneven, fix them. If there are any pets around you, be aware of where they are. Review your medicines with your doctor. Some medicines can make you feel dizzy. This can increase your chance of falling. Ask your doctor what other things that you can do to help prevent falls. This information is not intended to replace advice given to you by your health care provider. Make sure you discuss any questions you have with your health care provider. Document Released: 07/29/2009 Document Revised: 03/09/2016 Document Reviewed: 11/06/2014 Elsevier Interactive Patient Education  2017 [employer].

## 2023-04-20 ENCOUNTER — Ambulatory Visit (INDEPENDENT_AMBULATORY_CARE_PROVIDER_SITE_OTHER): Payer: Medicare Other | Admitting: Family Medicine

## 2023-04-20 VITALS — BP 116/70 | HR 79 | Temp 97.9°F | Resp 18 | Ht 60.0 in | Wt 128.6 lb

## 2023-04-20 DIAGNOSIS — R5383 Other fatigue: Secondary | ICD-10-CM | POA: Diagnosis not present

## 2023-04-20 DIAGNOSIS — Z8639 Personal history of other endocrine, nutritional and metabolic disease: Secondary | ICD-10-CM

## 2023-04-20 DIAGNOSIS — E559 Vitamin D deficiency, unspecified: Secondary | ICD-10-CM

## 2023-04-20 DIAGNOSIS — E162 Hypoglycemia, unspecified: Secondary | ICD-10-CM | POA: Insufficient documentation

## 2023-04-20 DIAGNOSIS — E785 Hyperlipidemia, unspecified: Secondary | ICD-10-CM | POA: Diagnosis not present

## 2023-04-20 DIAGNOSIS — E119 Type 2 diabetes mellitus without complications: Secondary | ICD-10-CM

## 2023-04-20 DIAGNOSIS — F321 Major depressive disorder, single episode, moderate: Secondary | ICD-10-CM

## 2023-04-20 DIAGNOSIS — E1165 Type 2 diabetes mellitus with hyperglycemia: Secondary | ICD-10-CM

## 2023-04-20 LAB — CBC WITH DIFFERENTIAL/PLATELET
Absolute Monocytes: 640 cells/uL (ref 200–950)
Basophils Relative: 0.3 %
MCH: 29.8 pg (ref 27.0–33.0)
MCHC: 33.9 g/dL (ref 32.0–36.0)
MCV: 87.8 fL (ref 80.0–100.0)
Platelets: 229 10*3/uL (ref 140–400)
RBC: 5.17 10*6/uL — ABNORMAL HIGH (ref 3.80–5.10)
WBC: 6.6 10*3/uL (ref 3.8–10.8)

## 2023-04-20 NOTE — Assessment & Plan Note (Signed)
Encourage heart healthy diet such as MIND or DASH diet, increase exercise, avoid trans fats, simple carbohydrates and processed foods, consider a krill or fish or flaxseed oil cap daily.  °

## 2023-04-20 NOTE — Progress Notes (Signed)
Established Patient Office Visit  Subjective   Patient ID: [name] [name], female    DOB: August 22, 1951  Age: 72 y.o. MRN: [id number]  Chief Complaint  Patient presents with   Fatigue    X2 months, pt states having fatigue and constipation.     HPI Discussed the use of AI scribe software for clinical note transcription with the patient, who gave verbal consent to proceed.  History of Present Illness   The patient presents with a month-long history of fatigue. They report taking magnesium and vitamin D supplements, but not on a daily basis. They have also been taking iron for the past week, but have not noticed any improvement in their symptoms. The patient has not been consuming meat for the past three months and has been experiencing difficulty swallowing, which has limited their protein intake. They have a history of esophageal stricture, which was previously dilated by a gastroenterologist. Despite this, they continue to experience difficulty swallowing when they eat large amounts, particularly protein-rich foods. They deny any recent muscle aches.      Patient Active Problem List   Diagnosis Date Noted   Other fatigue 04/20/2023   Hypoglycemia 04/20/2023   Educated about COVID-19 virus infection 09/07/2020   Frequent urination 11/19/2019   Hx of adenomatous colonic polyps 06/13/2019   Depression, major, single episode, moderate (HCC) 12/06/2017   Diabetes type 2, controlled (HCC) 04/28/2015   Mucocele of lower lip 01/13/2015   Hepatosplenomegaly 01/13/2015   Subacute appendicitis 01/13/2015   RLQ abdominal pain 12/01/2014   Gastroenteritis 11/20/2014   Knee pain, left 05/26/2014   Night sweats 12/10/2013   Lateral epicondylitis (tennis elbow) 04/24/2013   Tongue mass 02/12/2013   RUQ pain 12/09/2012   Chest pain 11/10/2012   Hyperlipidemia    Epigastric abdominal pain 08/14/2012   Anxiety 01/10/2012   Achalasia 10/24/2011   Vitamin D deficiency 10/19/2011   POLYURIA  12/21/2010   HYPERLIPIDEMIA 12/13/2007   GERD 03/07/2007   DIVERTICULOSIS, COLON 03/07/2007   Colon cancer screening 03/07/2007   LAPAROSCOPY, HX OF 03/07/2007   Past Medical History:  Diagnosis Date   Achalasia    Anxiety and depression    Diabetes type 2, controlled (HCC) 04/28/2015   Diverticulosis    Dysphagia    GERD (gastroesophageal reflux disease)    Hepatosplenomegaly    Hx of adenomatous colonic polyps 06/13/2019   Hyperlipidemia    Status post dilation of esophageal narrowing    Past Surgical History:  Procedure Laterality Date   COLONOSCOPY     ESOPHAGOGASTRODUODENOSCOPY     LAPAROSCOPIC MYOTOMY     Social History   Tobacco Use   Smoking status: Former    Packs/day: 0.20    Years: 5.00    Additional pack years: 0.00    Total pack years: 1.00    Types: Cigarettes    Quit date: 11/01/1979    Years since quitting: 43.4   Smokeless tobacco: Never  Vaping Use   Vaping Use: Never used  Substance Use Topics   Alcohol use: No   Drug use: No   Social History   Socioeconomic History   Marital status: Married    Spouse name: Not on file   Number of children: 1   Years of education: Not on file   Highest education level: Not on file  Occupational History   Occupation: Buyer, retail    Employer: [employer]  Tobacco Use   Smoking status: Former    Packs/day: 0.20  Years: 5.00    Additional pack years: 0.00    Total pack years: 1.00    Types: Cigarettes    Quit date: 11/01/1979    Years since quitting: 43.4   Smokeless tobacco: Never  Vaping Use   Vaping Use: Never used  Substance and Sexual Activity   Alcohol use: No   Drug use: No   Sexual activity: Not on file  Other Topics Concern   Not on file  Social History Narrative   Married, 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   She is a [place] immigrant   Former smoker no alcohol or drug use   1 child   Social Determinants of Corporate investment banker  Strain: Low Risk  (11/29/2021)   Overall Financial Resource Strain (CARDIA)    Difficulty of Paying Living Expenses: Not hard at all  Food Insecurity: No Food Insecurity (01/02/2023)   Hunger Vital Sign    Worried About Running Out of Food in the Last Year: Never true    Ran Out of Food in the Last Year: Never true  Transportation Needs: No Transportation Needs (01/02/2023)   PRAPARE - Administrator, Civil Service (Medical): No    Lack of Transportation (Non-Medical): No  Physical Activity: Unknown (04/20/2023)   Exercise Vital Sign    Days of Exercise per Week: Patient declined    Minutes of Exercise per Session: Not on file  Stress: Patient Declined (04/20/2023)   [employer] of Occupational Health - Occupational Stress Questionnaire    Feeling of Stress : Patient declined  Social Connections: Moderately Isolated (11/29/2021)   Social Connection and Isolation Panel [NHANES]    Frequency of Communication with Friends and Family: Once a week    Frequency of Social Gatherings with Friends and Family: Once a week    Attends Religious Services: More than 4 times per year    Active Member of [employer] or Organizations: No    Attends Banker Meetings: Never    Marital Status: Married  Catering manager Violence: Not At Risk (01/02/2023)   Humiliation, Afraid, Rape, and Kick questionnaire    Fear of Current or Ex-Partner: No    Emotionally Abused: No    Physically Abused: No    Sexually Abused: No   Family Status  Relation Name Status   Mother  Deceased at age 37   Father  Deceased at age 38       MI   Neg Hx  (Not Specified)   Family History  Problem Relation Age of Onset   Hypertension Mother    Heart disease Father    Depression Father    Colon cancer Neg Hx    Colon polyps Neg Hx    Diabetes Neg Hx    Kidney disease Neg Hx    No Known Allergies    Review of Systems  Constitutional:  Negative for fever and malaise/fatigue.  HENT:  Negative for  congestion.   Eyes:  Negative for blurred vision.  Respiratory:  Negative for cough and shortness of breath.   Cardiovascular:  Negative for chest pain, palpitations and leg swelling.  Gastrointestinal:  Negative for abdominal pain, blood in stool, nausea and vomiting.  Genitourinary:  Negative for dysuria and frequency.  Musculoskeletal:  Negative for back pain and falls.  Skin:  Negative for rash.  Neurological:  Negative for dizziness, loss of consciousness and headaches.  Endo/Heme/Allergies:  Negative for environmental allergies.  Psychiatric/Behavioral:  Negative for depression.  The patient is not nervous/anxious.       Objective:     BP 116/70 (BP Location: Left Arm, Patient Position: Sitting, Cuff Size: Normal)   Pulse 79   Temp 97.9 F (36.6 C) (Oral)   Resp 18   Ht 5' (1.524 m)   Wt 128 lb 9.6 oz (58.3 kg)   SpO2 97%   BMI 25.12 kg/m  BP Readings from Last 3 Encounters:  04/20/23 116/70  12/12/22 (!) 119/58  11/27/22 118/62   Wt Readings from Last 3 Encounters:  04/20/23 128 lb 9.6 oz (58.3 kg)  12/12/22 132 lb (59.9 kg)  11/27/22 132 lb (59.9 kg)   SpO2 Readings from Last 3 Encounters:  04/20/23 97%  12/12/22 95%  11/17/22 97%      Physical Exam Vitals and nursing note reviewed.  Constitutional:      General: She is not in acute distress.    Appearance: Normal appearance. She is well-developed.  HENT:     Head: Normocephalic and atraumatic.  Eyes:     General: No scleral icterus.       Right eye: No discharge.        Left eye: No discharge.  Cardiovascular:     Rate and Rhythm: Normal rate and regular rhythm.     Heart sounds: No murmur heard. Pulmonary:     Effort: Pulmonary effort is normal. No respiratory distress.     Breath sounds: Normal breath sounds.  Musculoskeletal:        General: Normal range of motion.     Cervical back: Normal range of motion and neck supple.     Right lower leg: No edema.     Left lower leg: No edema.   Skin:    General: Skin is warm and dry.  Neurological:     Mental Status: She is alert and oriented to person, place, and time.  Psychiatric:        Mood and Affect: Mood normal.        Behavior: Behavior normal.        Thought Content: Thought content normal.        Judgment: Judgment normal.      No results found for any visits on 04/20/23.  Last CBC Lab Results  Component Value Date   WBC 5.2 09/14/2021   HGB 13.8 09/14/2021   HCT 40.3 09/14/2021   MCV 88.0 09/14/2021   MCH 29.9 06/27/2020   RDW 13.0 09/14/2021   PLT 202.0 09/14/2021   Last metabolic panel Lab Results  Component Value Date   GLUCOSE 110 (H) 09/14/2021   NA 137 09/14/2021   K 4.6 09/14/2021   CL 102 09/14/2021   CO2 28 09/14/2021   BUN 12 09/14/2021   CREATININE 0.83 09/14/2021   GFR 71.29 09/14/2021   CALCIUM 10.2 09/14/2021   PROT 6.8 09/14/2021   ALBUMIN 4.4 09/14/2021   BILITOT 0.6 09/14/2021   ALKPHOS 67 09/14/2021   AST 48 (H) 09/14/2021   ALT 73 (H) 09/14/2021   ANIONGAP 11 06/27/2020   Last lipids Lab Results  Component Value Date   CHOL 214 (H) 09/14/2021   HDL 47.20 09/14/2021   LDLCALC 148 (H) 09/14/2021   LDLDIRECT 136.0 11/18/2019   TRIG 90.0 09/14/2021   CHOLHDL 5 09/14/2021   Last hemoglobin A1c Lab Results  Component Value Date   HGBA1C 5.9 09/14/2021   Last thyroid functions Lab Results  Component Value Date   TSH 1.88 09/14/2021   Last  vitamin D Lab Results  Component Value Date   VD25OH 117.00 (HH) 09/14/2021      The 10-year ASCVD risk score (Arnett DK, et al., 2019) is: 18.6%    Assessment & Plan:   Problem List Items Addressed This Visit       Unprioritized   Depression, major, single episode, moderate (HCC)   Other fatigue    Check labs  Pt has not been eating protein due to gerd and difficulty swallowing       Relevant Orders   Iron, TIBC and Ferritin Panel   TSH   Vitamin B12   Comprehensive metabolic panel   Hemoglobin A1c    CBC with Differential/Platelet   Hypoglycemia   Relevant Orders   Hemoglobin A1c   Hyperlipidemia - Primary    Encourage heart healthy diet such as MIND or DASH diet, increase exercise, avoid trans fats, simple carbohydrates and processed foods, consider a krill or fish or flaxseed oil cap daily.        Relevant Orders   Lipid panel   Comprehensive metabolic panel   CBC with Differential/Platelet   Diabetes type 2, controlled (HCC)    Check labs  hgba1c to be checked , minimize simple carbs. Increase exercise as tolerated. Continue current meds       Other Visit Diagnoses     History of vitamin D deficiency       Relevant Orders   VITAMIN D 25 Hydroxy (Vit-D Deficiency, Fractures)   Comprehensive metabolic panel   CBC with Differential/Platelet   Vitamin D deficiency, unspecified       Relevant Orders   VITAMIN D 25 Hydroxy (Vit-D Deficiency, Fractures)   Diet-controlled diabetes mellitus (HCC)   (Chronic)       Assessment and Plan    Fatigue: Ongoing for a month. Possible causes include inadequate protein intake, vitamin D deficiency, and vitamin B12 deficiency due to dietary restrictions. -Check vitamin D and B12 levels. -Encourage increased protein intake, possibly through protein drinks if necessary.  Esophageal Stricture: History of dilation for esophageal stricture. Currently experiencing difficulty swallowing, particularly with solid foods, which is impacting protein intake. -Continue Prilosec as prescribed. -Consider increasing Prilosec to twice daily. -Notify gastroenterologist of ongoing swallowing difficulties.  Hypoglycemia: Reports of blood sugar dropping to 1 after fasting for 14 hours. -Check A1c. -Advise against prolonged fasting.  General Health Maintenance: -Encourage consistent intake of vitamin D. -Advise against unnecessary iron supplementation without confirmed deficiency.        No follow-ups on file.    Donato Schultz, DO

## 2023-04-20 NOTE — Assessment & Plan Note (Signed)
Check labs  Pt has not been eating protein due to gerd and difficulty swallowing

## 2023-04-20 NOTE — Assessment & Plan Note (Signed)
Check labs 

## 2023-04-20 NOTE — Assessment & Plan Note (Signed)
Check labs  hgba1c to be checked, minimize simple carbs. Increase exercise as tolerated. Continue current meds  

## 2023-04-21 LAB — CBC WITH DIFFERENTIAL/PLATELET
Basophils Absolute: 20 cells/uL (ref 0–200)
Eosinophils Absolute: 191 cells/uL (ref 15–500)
Eosinophils Relative: 2.9 %
HCT: 45.4 % — ABNORMAL HIGH (ref 35.0–45.0)
Hemoglobin: 15.4 g/dL (ref 11.7–15.5)
Lymphs Abs: 1960 cells/uL (ref 850–3900)
MPV: 11.8 fL (ref 7.5–12.5)
Monocytes Relative: 9.7 %
Neutro Abs: 3788 cells/uL (ref 1500–7800)
Neutrophils Relative %: 57.4 %
RDW: 12.2 % (ref 11.0–15.0)
Total Lymphocyte: 29.7 %

## 2023-04-21 LAB — COMPREHENSIVE METABOLIC PANEL
AG Ratio: 1.5 (calc) (ref 1.0–2.5)
ALT: 168 U/L — ABNORMAL HIGH (ref 6–29)
AST: 126 U/L — ABNORMAL HIGH (ref 10–35)
Albumin: 4.3 g/dL (ref 3.6–5.1)
Alkaline phosphatase (APISO): 90 U/L (ref 37–153)
BUN: 15 mg/dL (ref 7–25)
CO2: 26 mmol/L (ref 20–32)
Calcium: 10.5 mg/dL — ABNORMAL HIGH (ref 8.6–10.4)
Chloride: 101 mmol/L (ref 98–110)
Creat: 0.76 mg/dL (ref 0.60–1.00)
Globulin: 2.8 g/dL (calc) (ref 1.9–3.7)
Glucose, Bld: 115 mg/dL — ABNORMAL HIGH (ref 65–99)
Potassium: 4.4 mmol/L (ref 3.5–5.3)
Sodium: 138 mmol/L (ref 135–146)
Total Bilirubin: 0.8 mg/dL (ref 0.2–1.2)
Total Protein: 7.1 g/dL (ref 6.1–8.1)

## 2023-04-21 LAB — HEMOGLOBIN A1C
Hgb A1c MFr Bld: 7.1 % of total Hgb — ABNORMAL HIGH (ref <5.7)
Mean Plasma Glucose: 157 mg/dL
eAG (mmol/L): 8.7 mmol/L

## 2023-04-22 ENCOUNTER — Other Ambulatory Visit: Payer: Self-pay | Admitting: Family Medicine

## 2023-04-22 ENCOUNTER — Encounter: Payer: Self-pay | Admitting: Family Medicine

## 2023-04-22 DIAGNOSIS — R748 Abnormal levels of other serum enzymes: Secondary | ICD-10-CM

## 2023-04-23 ENCOUNTER — Other Ambulatory Visit: Payer: Self-pay

## 2023-04-23 MED ORDER — METFORMIN HCL 500 MG PO TABS: 500.0000 mg | ORAL_TABLET | Freq: Two times a day (BID) | ORAL | 2 refills | Status: DC

## 2023-04-24 ENCOUNTER — Ambulatory Visit (HOSPITAL_BASED_OUTPATIENT_CLINIC_OR_DEPARTMENT_OTHER): Admission: RE | Admit: 2023-04-24 | Payer: Medicare Other | Source: Ambulatory Visit

## 2023-04-26 ENCOUNTER — Ambulatory Visit (HOSPITAL_BASED_OUTPATIENT_CLINIC_OR_DEPARTMENT_OTHER)
Admission: RE | Admit: 2023-04-26 | Discharge: 2023-04-26 | Disposition: A | Discharge status: Home / Self Care | Payer: Medicare Other | Source: Ambulatory Visit | Attending: Family Medicine | Admitting: Family Medicine

## 2023-04-26 DIAGNOSIS — R748 Abnormal levels of other serum enzymes: Secondary | ICD-10-CM | POA: Insufficient documentation

## 2023-04-30 ENCOUNTER — Other Ambulatory Visit: Payer: Self-pay | Admitting: Family Medicine

## 2023-04-30 DIAGNOSIS — R5383 Other fatigue: Secondary | ICD-10-CM

## 2023-04-30 DIAGNOSIS — R748 Abnormal levels of other serum enzymes: Secondary | ICD-10-CM

## 2023-05-01 ENCOUNTER — Encounter: Payer: Self-pay | Admitting: Internal Medicine

## 2023-05-01 ENCOUNTER — Encounter: Payer: Self-pay | Admitting: Family Medicine

## 2023-05-03 ENCOUNTER — Telehealth: Payer: Self-pay | Admitting: Internal Medicine

## 2023-05-03 DIAGNOSIS — R932 Abnormal findings on diagnostic imaging of liver and biliary tract: Secondary | ICD-10-CM

## 2023-05-03 DIAGNOSIS — K7581 Nonalcoholic steatohepatitis (NASH): Secondary | ICD-10-CM

## 2023-05-03 DIAGNOSIS — R748 Abnormal levels of other serum enzymes: Secondary | ICD-10-CM

## 2023-05-03 NOTE — Telephone Encounter (Signed)
I am ordering a lab work-up she can do today - she does not need a CT  Once I see labs (it will be next week before they all return) I will call her.  I do not see any signs of a serious problem.  We can adjust appointments later if needed.

## 2023-05-03 NOTE — Telephone Encounter (Signed)
Patient requested earlier appointment time. Soonest with Dr. Leone Payor is October & she is concerned that she needs to be seen sooner d/t abnormal [place] & labs. Scheduled OV with Gunnar Fusi, NP for 9/6 at 10:00 am & if follow up still needed she would prefer to keep October date with Dr. Leone Payor. Will make MD aware of patient's concerns. Advised her that if any further testing was needed prior to OV then we would call to make her aware. Pt verbalized all understanding.

## 2023-05-03 NOTE — Telephone Encounter (Signed)
Inbound call from patient stating she needed to make an appointment with Dr. Leone Payor for Fatty liver. I advised patient that the next appointment was in October. Patient stated that she could not wait that long and requested to speak with the nurse. Please advise.

## 2023-05-03 NOTE — Telephone Encounter (Signed)
Spoke with patient regarding MD recommendations. Pt advised on when/where to go for labs. Pt verbalized all understanding.

## 2023-05-04 ENCOUNTER — Other Ambulatory Visit: Payer: Medicare Other

## 2023-05-07 ENCOUNTER — Other Ambulatory Visit (INDEPENDENT_AMBULATORY_CARE_PROVIDER_SITE_OTHER): Payer: Medicare Other

## 2023-05-07 DIAGNOSIS — R932 Abnormal findings on diagnostic imaging of liver and biliary tract: Secondary | ICD-10-CM

## 2023-05-07 DIAGNOSIS — R748 Abnormal levels of other serum enzymes: Secondary | ICD-10-CM | POA: Diagnosis not present

## 2023-05-07 DIAGNOSIS — K7581 Nonalcoholic steatohepatitis (NASH): Secondary | ICD-10-CM

## 2023-05-07 LAB — HEPATIC FUNCTION PANEL
ALT: 133 U/L — ABNORMAL HIGH (ref 0–35)
AST: 80 U/L — ABNORMAL HIGH (ref 0–37)
Albumin: 4.4 g/dL (ref 3.5–5.2)
Alkaline Phosphatase: 91 U/L (ref 39–117)
Bilirubin, Direct: 0.2 mg/dL (ref 0.0–0.3)
Total Bilirubin: 0.8 mg/dL (ref 0.2–1.2)
Total Protein: 7.5 g/dL (ref 6.0–8.3)

## 2023-05-07 LAB — CBC
HCT: 45.2 % (ref 36.0–46.0)
Hemoglobin: 15.2 g/dL — ABNORMAL HIGH (ref 12.0–15.0)
MCHC: 33.6 g/dL (ref 30.0–36.0)
MCV: 88.2 fl (ref 78.0–100.0)
Platelets: 218 10*3/uL (ref 150.0–400.0)
RBC: 5.12 Mil/uL — ABNORMAL HIGH (ref 3.87–5.11)
RDW: 12.4 % (ref 11.5–15.5)
WBC: 5.6 10*3/uL (ref 4.0–10.5)

## 2023-05-07 LAB — FERRITIN: Ferritin: 436.1 ng/mL — ABNORMAL HIGH (ref 10.0–291.0)

## 2023-05-08 LAB — ANA: Anti Nuclear Antibody (ANA): NEGATIVE

## 2023-05-08 LAB — IGA: Immunoglobulin A: 436 mg/dL — ABNORMAL HIGH (ref 70–320)

## 2023-05-08 LAB — HEPATITIS PANEL, ACUTE
Hep A IgM: NONREACTIVE
Hep B C IgM: NONREACTIVE
Hepatitis C Ab: NONREACTIVE

## 2023-05-08 LAB — ALPHA-1-ANTITRYPSIN: A-1 Antitrypsin, Ser: 159 mg/dL (ref 83–199)

## 2023-05-10 LAB — TISSUE TRANSGLUTAMINASE, IGA: (tTG) Ab, IgA: <1 U/mL

## 2023-05-10 LAB — MITOCHONDRIAL ANTIBODIES: Mitochondrial M2 Ab, IgG: <=20 U (ref <=20.0)

## 2023-05-10 LAB — HEPATITIS PANEL, ACUTE: Hepatitis B Surface Ag: NONREACTIVE

## 2023-05-21 ENCOUNTER — Other Ambulatory Visit: Payer: Self-pay

## 2023-05-21 DIAGNOSIS — K760 Fatty (change of) liver, not elsewhere classified: Secondary | ICD-10-CM

## 2023-05-25 ENCOUNTER — Ambulatory Visit (HOSPITAL_COMMUNITY)
Admission: RE | Admit: 2023-05-25 | Discharge: 2023-05-25 | Disposition: A | Discharge status: Home / Self Care | Payer: Medicare Other | Source: Ambulatory Visit | Attending: Internal Medicine | Admitting: Internal Medicine

## 2023-05-25 DIAGNOSIS — K760 Fatty (change of) liver, not elsewhere classified: Secondary | ICD-10-CM | POA: Diagnosis not present

## 2023-06-13 ENCOUNTER — Ambulatory Visit: Payer: Medicare Other | Admitting: Family Medicine

## 2023-06-19 ENCOUNTER — Encounter: Payer: Self-pay | Admitting: Family

## 2023-06-19 ENCOUNTER — Ambulatory Visit (INDEPENDENT_AMBULATORY_CARE_PROVIDER_SITE_OTHER): Payer: Medicare Other | Admitting: Family

## 2023-06-19 ENCOUNTER — Ambulatory Visit (HOSPITAL_BASED_OUTPATIENT_CLINIC_OR_DEPARTMENT_OTHER)
Admission: RE | Admit: 2023-06-19 | Discharge: 2023-06-19 | Disposition: A | Discharge status: Home / Self Care | Payer: Medicare Other | Source: Ambulatory Visit | Attending: Family | Admitting: Family

## 2023-06-19 VITALS — BP 110/60 | HR 98 | Temp 98.1°F | Resp 19 | Ht 60.0 in | Wt 127.8 lb

## 2023-06-19 DIAGNOSIS — R053 Chronic cough: Secondary | ICD-10-CM | POA: Insufficient documentation

## 2023-06-19 DIAGNOSIS — J209 Acute bronchitis, unspecified: Secondary | ICD-10-CM

## 2023-06-19 MED ORDER — CEFDINIR 250 MG/5ML PO SUSR: 300.0000 mg | Freq: Two times a day (BID) | ORAL | 0 refills | Status: AC

## 2023-06-19 MED ORDER — PROMETHAZINE-DM 6.25-15 MG/5ML PO SYRP: 2.5000 mL | ORAL_SOLUTION | Freq: Four times a day (QID) | ORAL | 0 refills | Status: DC | PRN

## 2023-06-19 NOTE — Progress Notes (Signed)
[name] [name] is a 72 y.o. female with the following history as recorded in EpicCare:  Patient Active Problem List   Diagnosis Date Noted   Other fatigue 04/20/2023   Hypoglycemia 04/20/2023   Educated about COVID-19 virus infection 09/07/2020   Frequent urination 11/19/2019   Hx of adenomatous colonic polyps 06/13/2019   Depression, major, single episode, moderate (HCC) 12/06/2017   Diabetes type 2, controlled (HCC) 04/28/2015   Mucocele of lower lip 01/13/2015   Hepatosplenomegaly 01/13/2015   Subacute appendicitis 01/13/2015   RLQ abdominal pain 12/01/2014   Gastroenteritis 11/20/2014   Knee pain, left 05/26/2014   Night sweats 12/10/2013   Lateral epicondylitis (tennis elbow) 04/24/2013   Tongue mass 02/12/2013   RUQ pain 12/09/2012   Chest pain 11/10/2012   Hyperlipidemia    Epigastric abdominal pain 08/14/2012   Anxiety 01/10/2012   Achalasia 10/24/2011   Vitamin D deficiency 10/19/2011   POLYURIA 12/21/2010   HYPERLIPIDEMIA 12/13/2007   GERD 03/07/2007   DIVERTICULOSIS, COLON 03/07/2007   Colon cancer screening 03/07/2007   LAPAROSCOPY, HX OF 03/07/2007    Current Outpatient Medications  Medication Sig Dispense Refill   AMBULATORY NON FORMULARY MEDICATION Magnesium 400 mg suspension: 5 days a week     cefdinir (OMNICEF) 250 MG/5ML suspension Take 6 mLs (300 mg total) by mouth 2 (two) times daily for 10 days. 120 mL 0   Cholecalciferol (VITAMIN D) 125 MCG (5000 UT) CAPS Take by mouth daily. Several times a week     lamoTRIgine (LAMICTAL) 25 MG tablet Take 50 mg by mouth daily.     metFORMIN (GLUCOPHAGE) 500 MG tablet Take 1 tablet (500 mg total) by mouth 2 (two) times daily with a meal. (Patient not taking: Reported on 06/19/2023) 60 tablet 2   multivitamin-lutein (OCUVITE-LUTEIN) CAPS capsule Take 1 capsule by mouth daily.     DULoxetine (CYMBALTA) 30 MG capsule Take 30 mg by mouth daily. (Patient not taking: Reported on 06/19/2023)     promethazine-dextromethorphan  (PROMETHAZINE-DM) 6.25-15 MG/5ML syrup Take 2.5 mLs by mouth 4 (four) times daily as needed. 120 mL 0   No current facility-administered medications for this visit.    Allergies: Patient has no known allergies.  Past Medical History:  Diagnosis Date   Achalasia    Anxiety and depression    Diabetes type 2, controlled (HCC) 04/28/2015   Diverticulosis    Dysphagia    GERD (gastroesophageal reflux disease)    Hepatosplenomegaly    Hx of adenomatous colonic polyps 06/13/2019   Hyperlipidemia    Status post dilation of esophageal narrowing     Past Surgical History:  Procedure Laterality Date   COLONOSCOPY     ESOPHAGOGASTRODUODENOSCOPY     LAPAROSCOPIC MYOTOMY      Family History  Problem Relation Age of Onset   Hypertension Mother    Heart disease Father    Depression Father    Colon cancer Neg Hx    Colon polyps Neg Hx    Diabetes Neg Hx    Kidney disease Neg Hx     Social History   Tobacco Use   Smoking status: Former    Current packs/day: 0.00    Average packs/day: 0.2 packs/day for 5.0 years (1.0 ttl pk-yrs)    Types: Cigarettes    Start date: 10/31/1974    Quit date: 11/01/1979    Years since quitting: 43.6   Smokeless tobacco: Never  Substance Use Topics   Alcohol use: No    Subjective:   Persistent  cough x 3 weeks; requesting CXR today; +productive cough; using OTC Mucinex with some relief; Requesting liquid antibiotic- does not want Zithromax; does not feel comfortable using any type of nasal spray;   Objective:  Vitals:   06/19/23 1340  BP: 110/60  Pulse: 98  Resp: 19  Temp: 98.1 F (36.7 C)  TempSrc: Oral  SpO2: 96%  Weight: 127 lb 12.8 oz (58 kg)  Height: 5' (1.524 m)    General: Well developed, well nourished, in no acute distress  Skin : Warm and dry.  Head: Normocephalic and atraumatic  Eyes: Sclera and conjunctiva clear; pupils round and reactive to light; extraocular movements intact  Ears: External normal; canals clear; tympanic  membranes normal  Oropharynx: Pink, supple. No suspicious lesions  Neck: Supple without thyromegaly, adenopathy  Lungs: Respirations unlabored; clear to auscultation bilaterally without wheeze, rales, rhonchi  CVS exam: normal rate and regular rhythm.  Neurologic: Alert and oriented; speech intact; face symmetrical; moves all extremities well; CNII-XII intact without focal deficit   Assessment:  1. Persistent cough   2. Acute bronchitis, unspecified organism     Plan:  Update CXR today; Rx for Omnicef 300 mg ( liquid formulation per patient request) bid x 10 days, Rx for Promethazine DM; increase fluids, rest and follow up worse, no better.   No follow-ups on file.  Orders Placed This Encounter  Procedures   DG Chest 2 View    Standing Status:   Future    Standing Expiration Date:   06/18/2024    Order Specific Question:   Reason for Exam (SYMPTOM  OR DIAGNOSIS REQUIRED)    Answer:   persistent cough x 3 weeks    Order Specific Question:   Preferred imaging location?    Answer:   [facility]    Requested Prescriptions   Signed Prescriptions Disp Refills   promethazine-dextromethorphan (PROMETHAZINE-DM) 6.25-15 MG/5ML syrup 120 mL 0    Sig: Take 2.5 mLs by mouth 4 (four) times daily as needed.   cefdinir (OMNICEF) 250 MG/5ML suspension 120 mL 0    Sig: Take 6 mLs (300 mg total) by mouth 2 (two) times daily for 10 days.

## 2023-06-22 ENCOUNTER — Ambulatory Visit: Payer: Medicare Other | Admitting: Nurse Practitioner

## 2023-06-28 ENCOUNTER — Telehealth: Payer: Self-pay | Admitting: Family Medicine

## 2023-06-28 NOTE — Telephone Encounter (Signed)
Prescription Request  06/28/2023  Is this a "Controlled Substance" medicine? No  LOV: 04/20/2023  What is the name of the medication or equipment?   promethazine-dextromethorphan (PROMETHAZINE-DM) 6.25-15 MG/5ML syrup [[id number]]  Have you contacted your pharmacy to request a refill? No   Which pharmacy would you like this sent to?   [id number] [place], [place] - [address] [place] [address] Phone: [phone] Fax: [phone]  Patient notified that their request is being sent to the clinical staff for review and that they should receive a response within 2 business days.   Please advise at Mobile [phone] (mobile)

## 2023-06-29 ENCOUNTER — Encounter: Payer: Self-pay | Admitting: Family Medicine

## 2023-06-29 ENCOUNTER — Ambulatory Visit: Payer: Medicare Other | Admitting: Family Medicine

## 2023-06-29 ENCOUNTER — Other Ambulatory Visit: Payer: Self-pay | Admitting: Family Medicine

## 2023-06-29 MED ORDER — PROMETHAZINE-DM 6.25-15 MG/5ML PO SYRP: 2.5000 mL | ORAL_SOLUTION | Freq: Four times a day (QID) | ORAL | 0 refills | Status: DC | PRN

## 2023-06-29 NOTE — Telephone Encounter (Signed)
error 

## 2023-06-29 NOTE — Telephone Encounter (Signed)
PT requested to have an OV because she wanted to make sure she could receive another prescription for the cough syrup. Pt stated she is still having symptoms.

## 2023-06-29 NOTE — Telephone Encounter (Signed)
Pt called back to f/u. Please advise.

## 2023-07-23 ENCOUNTER — Encounter: Payer: Self-pay | Admitting: Internal Medicine

## 2023-07-23 ENCOUNTER — Telehealth: Payer: Self-pay | Admitting: Family Medicine

## 2023-07-23 NOTE — Telephone Encounter (Signed)
Pt would like to transfer care to Dr.Wendling. Please advise.

## 2023-07-23 NOTE — Telephone Encounter (Signed)
No

## 2023-07-24 ENCOUNTER — Other Ambulatory Visit: Payer: Self-pay | Admitting: Internal Medicine

## 2023-07-24 DIAGNOSIS — R748 Abnormal levels of other serum enzymes: Secondary | ICD-10-CM

## 2023-07-24 NOTE — Telephone Encounter (Signed)
Ldvm

## 2023-07-26 ENCOUNTER — Ambulatory Visit: Payer: Medicare Other | Admitting: Family

## 2023-07-27 ENCOUNTER — Ambulatory Visit: Payer: Medicare Other | Admitting: Family

## 2023-07-31 ENCOUNTER — Ambulatory Visit: Payer: Medicare Other | Admitting: Internal Medicine

## 2023-08-13 ENCOUNTER — Other Ambulatory Visit (INDEPENDENT_AMBULATORY_CARE_PROVIDER_SITE_OTHER): Payer: Medicare Other

## 2023-08-13 DIAGNOSIS — R748 Abnormal levels of other serum enzymes: Secondary | ICD-10-CM | POA: Diagnosis not present

## 2023-08-13 LAB — HEPATIC FUNCTION PANEL
ALT: 136 U/L — ABNORMAL HIGH (ref 0–35)
AST: 104 U/L — ABNORMAL HIGH (ref 0–37)
Albumin: 4.6 g/dL (ref 3.5–5.2)
Alkaline Phosphatase: 84 U/L (ref 39–117)
Bilirubin, Direct: 0.2 mg/dL (ref 0.0–0.3)
Total Bilirubin: 0.9 mg/dL (ref 0.2–1.2)
Total Protein: 7.6 g/dL (ref 6.0–8.3)

## 2023-09-20 ENCOUNTER — Ambulatory Visit: Payer: Medicare Other | Admitting: Internal Medicine

## 2023-10-31 ENCOUNTER — Telehealth: Payer: Self-pay | Admitting: Family Medicine

## 2023-10-31 NOTE — Telephone Encounter (Signed)
 Copied from CRM [id number]. Topic: Appointments - Transfer of Care >> Oct 31, 2023  3:24 PM Kita Perish H wrote: Pt is requesting to transfer FROM: Roel Clarity Pt is requesting to transfer TO: Laymon Priest Reason for requested transfer: Per notes in patient chart okay to schedule appointment with Dr. Paulla Bossier as new patient It is the responsibility of the team the patient would like to transfer to (Dr. Paulla Bossier) to reach out to the patient if for any reason this transfer is not acceptable.

## 2023-10-31 NOTE — Telephone Encounter (Signed)
 Okay

## 2023-10-31 NOTE — Telephone Encounter (Signed)
 See earlier phone message from today. Pt was left message about scheduling new pt appt. At this time, pt has not returned call. Also sent a message to daughter to let her know as well.

## 2023-10-31 NOTE — Telephone Encounter (Signed)
 Pt's daughter talked with Dr. Paulla Bossier about establishing care. Dr. Paulla Bossier has agreed to accept her as a new pt. Documentation is in daughter's chart. Called and LM to return call to schedule new patient appointment with Dr. Paulla Bossier.

## 2023-10-31 NOTE — Telephone Encounter (Signed)
Ok to transfer care.  

## 2023-11-19 DIAGNOSIS — I781 Nevus, non-neoplastic: Secondary | ICD-10-CM | POA: Insufficient documentation

## 2023-11-19 DIAGNOSIS — D2230 Melanocytic nevi of unspecified part of face: Secondary | ICD-10-CM | POA: Insufficient documentation

## 2023-11-19 DIAGNOSIS — D239 Other benign neoplasm of skin, unspecified: Secondary | ICD-10-CM | POA: Insufficient documentation

## 2023-11-19 NOTE — Telephone Encounter (Signed)
Copied from CRM [id number]. Topic: General - Call Back - No Documentation >> Nov 19, 2023  4:24 PM Irine Seal wrote: Reason for CRM: patient stated she received a call or message to reschedule looked through her chart and did not see anything relaying that, if the patient needs to reschedule please give her a call back at [phone]

## 2023-11-20 ENCOUNTER — Encounter: Payer: Self-pay | Admitting: Family Medicine

## 2023-11-20 ENCOUNTER — Ambulatory Visit (INDEPENDENT_AMBULATORY_CARE_PROVIDER_SITE_OTHER): Payer: Medicare Other | Admitting: Family Medicine

## 2023-11-20 VITALS — BP 100/60 | HR 66 | Temp 98.1°F | Ht 60.0 in | Wt 121.4 lb

## 2023-11-20 DIAGNOSIS — F321 Major depressive disorder, single episode, moderate: Secondary | ICD-10-CM

## 2023-11-20 DIAGNOSIS — E1165 Type 2 diabetes mellitus with hyperglycemia: Secondary | ICD-10-CM | POA: Diagnosis not present

## 2023-11-20 DIAGNOSIS — E785 Hyperlipidemia, unspecified: Secondary | ICD-10-CM | POA: Diagnosis not present

## 2023-11-20 DIAGNOSIS — E1169 Type 2 diabetes mellitus with other specified complication: Secondary | ICD-10-CM | POA: Diagnosis not present

## 2023-11-20 DIAGNOSIS — E559 Vitamin D deficiency, unspecified: Secondary | ICD-10-CM

## 2023-11-20 LAB — HEPATIC FUNCTION PANEL
ALT: 52 U/L — ABNORMAL HIGH (ref 0–35)
AST: 38 U/L — ABNORMAL HIGH (ref 0–37)
Albumin: 4.2 g/dL (ref 3.5–5.2)
Alkaline Phosphatase: 77 U/L (ref 39–117)
Bilirubin, Direct: 0.1 mg/dL (ref 0.0–0.3)
Total Bilirubin: 0.7 mg/dL (ref 0.2–1.2)
Total Protein: 6.9 g/dL (ref 6.0–8.3)

## 2023-11-20 LAB — BASIC METABOLIC PANEL
BUN: 13 mg/dL (ref 6–23)
CO2: 26 meq/L (ref 19–32)
Calcium: 9.7 mg/dL (ref 8.4–10.5)
Chloride: 103 meq/L (ref 96–112)
Creatinine, Ser: 0.84 mg/dL (ref 0.40–1.20)
GFR: 69.21 mL/min (ref >60.00)
Glucose, Bld: 125 mg/dL — ABNORMAL HIGH (ref 70–99)
Potassium: 4.3 meq/L (ref 3.5–5.1)
Sodium: 138 meq/L (ref 135–145)

## 2023-11-20 LAB — CBC WITH DIFFERENTIAL/PLATELET
Basophils Absolute: 0 10*3/uL (ref 0.0–0.1)
Basophils Relative: 0.5 % (ref 0.0–3.0)
Eosinophils Absolute: 0.2 10*3/uL (ref 0.0–0.7)
Eosinophils Relative: 3.1 % (ref 0.0–5.0)
HCT: 43.1 % (ref 36.0–46.0)
Hemoglobin: 14.7 g/dL (ref 12.0–15.0)
Lymphocytes Relative: 30.1 % (ref 12.0–46.0)
Lymphs Abs: 1.6 10*3/uL (ref 0.7–4.0)
MCHC: 34.1 g/dL (ref 30.0–36.0)
MCV: 88.9 fL (ref 78.0–100.0)
Monocytes Absolute: 0.7 10*3/uL (ref 0.1–1.0)
Monocytes Relative: 12.1 % — ABNORMAL HIGH (ref 3.0–12.0)
Neutro Abs: 2.9 10*3/uL (ref 1.4–7.7)
Neutrophils Relative %: 54.2 % (ref 43.0–77.0)
Platelets: 207 10*3/uL (ref 150.0–400.0)
RBC: 4.85 Mil/uL (ref 3.87–5.11)
RDW: 13.1 % (ref 11.5–15.5)
WBC: 5.4 10*3/uL (ref 4.0–10.5)

## 2023-11-20 LAB — HEMOGLOBIN A1C: Hgb A1c MFr Bld: 6.2 % (ref 4.6–6.5)

## 2023-11-20 LAB — LIPID PANEL
Cholesterol: 206 mg/dL — ABNORMAL HIGH (ref 0–200)
HDL: 46.1 mg/dL (ref >39.00)
LDL Cholesterol: 132 mg/dL — ABNORMAL HIGH (ref 0–99)
NonHDL: 159.67
Total CHOL/HDL Ratio: 4
Triglycerides: 136 mg/dL (ref 0.0–149.0)
VLDL: 27.2 mg/dL (ref 0.0–40.0)

## 2023-11-20 LAB — TSH: TSH: 2.36 u[IU]/mL (ref 0.35–5.50)

## 2023-11-20 LAB — MICROALBUMIN / CREATININE URINE RATIO
Creatinine,U: 135.9 mg/dL
Microalb Creat Ratio: 0.6 mg/g (ref 0.0–30.0)
Microalb, Ur: 0.9 mg/dL (ref 0.0–1.9)

## 2023-11-20 LAB — VITAMIN D 25 HYDROXY (VIT D DEFICIENCY, FRACTURES): VITD: 62.67 ng/mL (ref 30.00–100.00)

## 2023-11-20 NOTE — Progress Notes (Signed)
   Subjective:    Patient ID: [name] [name], female    DOB: 1950/12/01, 73 y.o.   MRN: [id number]  HPI Transition of Care.  Previously seeing Dr Antonio  DM- last A1C 7.1% on 04/20/23.  Pt reports being UTD on eye exam.  Due for foot exam.  Due for microalbumin.  Denies CP, SOB, HA's, visual changes.  No numbness/tingling of hands/feet.  Hyperlipidemia- chronic problem.  Not currently on medication.  Last LDL 148.  No abd pain, N/V.  Depression- ongoing issue.  Currently on Wellbutrin  ER 100mg  BID and Lexapro .  Currently on 20mg  Lexapro  but in the weaning process.  Followed at [facility].  Pt feels that mood is well controlled.   Review of Systems For ROS see HPI     Objective:   Physical Exam Vitals reviewed.  Constitutional:      General: She is not in acute distress.    Appearance: Normal appearance. She is well-developed. She is not ill-appearing.  HENT:     Head: Normocephalic and atraumatic.  Eyes:     Conjunctiva/sclera: Conjunctivae normal.     Pupils: Pupils are equal, round, and reactive to light.  Neck:     Thyroid : No thyromegaly.  Cardiovascular:     Rate and Rhythm: Normal rate and regular rhythm.     Pulses: Normal pulses.     Heart sounds: Normal heart sounds. No murmur heard. Pulmonary:     Effort: Pulmonary effort is normal. No respiratory distress.     Breath sounds: Normal breath sounds.  Abdominal:     General: There is no distension.     Palpations: Abdomen is soft.     Tenderness: There is no abdominal tenderness.  Musculoskeletal:     Cervical back: Normal range of motion and neck supple.     Right lower leg: No edema.     Left lower leg: No edema.  Lymphadenopathy:     Cervical: No cervical adenopathy.  Skin:    General: Skin is warm and dry.  Neurological:     General: No focal deficit present.     Mental Status: She is alert and oriented to person, place, and time.  Psychiatric:        Mood and Affect: Mood normal.        Behavior: Behavior  normal.           Assessment & Plan:

## 2023-11-20 NOTE — Patient Instructions (Addendum)
 Follow up in 6 months to recheck sugar and cholesterol We'll notify you of your lab results and make any changes if needed Continue to work on healthy diet and regular exercise- you can do it! Call with any questions or concerns Stay Safe!  Stay Healthy! Welcome Back!

## 2023-11-20 NOTE — Assessment & Plan Note (Signed)
Chronic problem.  Following w/ [facility].  Feels mood is currently well controlled on Wellbutrin and Lexapro.  Is actually in the process of weaning Lexapro.  Will follow along.

## 2023-11-20 NOTE — Assessment & Plan Note (Signed)
Ongoing issue.  Last A1C 7.1%  Pt not currently on medication.  Was on Metformin in the past.  Foot exam done today.  Microalbumin ordered.  Currently asymptomatic.  Check labs and determine if medication is needed.

## 2023-11-20 NOTE — Assessment & Plan Note (Signed)
Chronic problem.  She is not currently on medication and hoping to control w/ diet and exercise.  Check labs and determine if medication is needed.

## 2023-11-21 ENCOUNTER — Telehealth: Payer: Self-pay

## 2023-11-21 ENCOUNTER — Other Ambulatory Visit: Payer: Self-pay

## 2023-11-21 ENCOUNTER — Encounter: Payer: Self-pay | Admitting: Family Medicine

## 2023-11-21 DIAGNOSIS — E785 Hyperlipidemia, unspecified: Secondary | ICD-10-CM

## 2023-11-21 MED ORDER — ROSUVASTATIN CALCIUM 10 MG PO TABS: 10.0000 mg | ORAL_TABLET | Freq: Every day | ORAL | 3 refills | Status: DC

## 2023-11-21 NOTE — Telephone Encounter (Signed)
-----   Message from Comer Greet sent at 11/21/2023 12:32 PM EST ----- Labs look great w/ exception of LDL (bad cholesterol) which is 132.  Goal for someone with diabetes is 70.  Based on this, I recommend Crestor  10mg  daily (#30, 3 refills) to get this number closer to goal.  We will repeat your liver functions at a lab only visit in 6 weeks (LFTs, dx hyperlipidemia) to ensure you are processing the medication appropriately.  Your liver functions look GREAT!  So much better than they were  A1C is excellent at 6.2%  Keep up the good work!

## 2023-11-22 NOTE — Telephone Encounter (Signed)
Left vm to call office about labs. 

## 2023-12-11 NOTE — Telephone Encounter (Signed)
 Reached out to pt to try and schedule 6 week f/u lab appt, left VM to call back and schedule Lab only visit around March 19th

## 2024-02-25 ENCOUNTER — Encounter: Payer: Self-pay | Admitting: Family Medicine

## 2024-02-25 ENCOUNTER — Ambulatory Visit (INDEPENDENT_AMBULATORY_CARE_PROVIDER_SITE_OTHER): Admitting: Family Medicine

## 2024-02-25 VITALS — BP 94/58 | HR 95 | Ht 60.0 in | Wt 119.0 lb

## 2024-02-25 DIAGNOSIS — R61 Generalized hyperhidrosis: Secondary | ICD-10-CM | POA: Diagnosis not present

## 2024-02-25 DIAGNOSIS — M79671 Pain in right foot: Secondary | ICD-10-CM | POA: Diagnosis not present

## 2024-02-25 MED ORDER — DICLOFENAC SODIUM 1 % EX GEL: 2.0000 g | Freq: Four times a day (QID) | CUTANEOUS | 1 refills | Status: DC

## 2024-02-25 NOTE — Progress Notes (Signed)
   Subjective:    Patient ID: [name] [name], female    DOB: Oct 29, 1950, 73 y.o.   MRN: [id number]  HPI R foot pain- pt reports 2nd toe is swollen and painful at MTP joint.  Denies kicking anything or stubbing toe.  No recollection of dropping something on her foot.  No known injury.  Sxs started ~2 weeks.    Sweating- pt reports sweating started when taking Lexapro .  Has since stopped medication and some improvement in sweating- particularly at night.     Review of Systems For ROS see HPI     Objective:   Physical Exam Vitals reviewed.  Constitutional:      General: She is not in acute distress.    Appearance: Normal appearance. She is not ill-appearing.  Cardiovascular:     Pulses: Normal pulses.  Musculoskeletal:        General: Swelling (over distal 2nd-4th metatarsal heads) and tenderness (TTP over 2nd metatarsal head) present.  Skin:    General: Skin is warm and dry.  Neurological:     General: No focal deficit present.     Mental Status: She is alert and oriented to person, place, and time.           Assessment & Plan:  R foot pain- new.  Pt has swelling and TTP over 2nd metatarsal head.  She denies injury.  Will get xray to assess.  Start topical voltaren  gel.  Ice and supportive shoes.  Pt expressed understanding and is in agreement w/ plan.   Sweating- new.  Pt reports sweating started after she started Lexapro .  Some improvement since stopping meds.  Encouraged her to stay off meds for now and monitor sxs.  Pt expressed understanding and is in agreement w/ plan.

## 2024-02-25 NOTE — Patient Instructions (Signed)
 Follow up as needed or as scheduled Go to the [facility] on [address] and get your foot xray Make sure you are wearing supportive shoes like sneakers ICE the top of your foot for relief of pain and swelling Apply the Diclofenac  gel for pain relief STOP the Lexapro  to improve sweating Call with any questions or concerns Hang in there!!!

## 2024-02-26 ENCOUNTER — Ambulatory Visit (HOSPITAL_BASED_OUTPATIENT_CLINIC_OR_DEPARTMENT_OTHER)
Admission: RE | Admit: 2024-02-26 | Discharge: 2024-02-26 | Disposition: A | Discharge status: Home / Self Care | Source: Ambulatory Visit | Attending: Family Medicine | Admitting: Family Medicine

## 2024-02-26 ENCOUNTER — Encounter: Payer: Self-pay | Admitting: Family Medicine

## 2024-02-26 ENCOUNTER — Ambulatory Visit: Admitting: Family Medicine

## 2024-02-26 DIAGNOSIS — M79671 Pain in right foot: Secondary | ICD-10-CM | POA: Insufficient documentation

## 2024-02-27 NOTE — Telephone Encounter (Signed)
 Patient would like to be referred to a GYN asap.

## 2024-02-28 ENCOUNTER — Ambulatory Visit: Payer: Self-pay | Admitting: Family Medicine

## 2024-02-28 NOTE — Telephone Encounter (Signed)
-----   Message from Laymon Priest sent at 02/28/2024  7:31 AM EDT ----- Thankfully foot xrays were negative.  Likely a bruise w/ swelling and should be improving w/ time.

## 2024-02-29 ENCOUNTER — Telehealth: Payer: Self-pay

## 2024-02-29 NOTE — Telephone Encounter (Signed)
-----   Message from Larissa Plowman, [place] sent at 02/29/2024  9:42 AM EDT ----- Left vm to call office about results

## 2024-02-29 NOTE — Telephone Encounter (Signed)
 Copied from CRM [id number]. Topic: General - Other >> Feb 29, 2024  9:47 AM Howard Macho wrote: Reason for CRM: patient returning a call to the office for [facility] regarding xrays. I read the message to the patient and let her know that a mychart message was sent also.

## 2024-02-29 NOTE — Telephone Encounter (Signed)
 Pt has been notified.

## 2024-02-29 NOTE — Telephone Encounter (Signed)
-----   Message from Larissa Plowman, [place] sent at 02/28/2024  9:40 AM EDT ----- Called pt mailbox full unable to leave vm

## 2024-03-04 DIAGNOSIS — D2330 Other benign neoplasm of skin of unspecified part of face: Secondary | ICD-10-CM | POA: Insufficient documentation

## 2024-03-05 ENCOUNTER — Ambulatory Visit (INDEPENDENT_AMBULATORY_CARE_PROVIDER_SITE_OTHER): Admitting: Family Medicine

## 2024-03-05 ENCOUNTER — Encounter: Payer: Self-pay | Admitting: Family Medicine

## 2024-03-05 VITALS — BP 122/60 | HR 78 | Temp 98.7°F | Ht 60.0 in | Wt 118.0 lb

## 2024-03-05 DIAGNOSIS — Z1231 Encounter for screening mammogram for malignant neoplasm of breast: Secondary | ICD-10-CM | POA: Diagnosis not present

## 2024-03-05 DIAGNOSIS — M79671 Pain in right foot: Secondary | ICD-10-CM

## 2024-03-05 DIAGNOSIS — N9089 Other specified noninflammatory disorders of vulva and perineum: Secondary | ICD-10-CM | POA: Diagnosis not present

## 2024-03-05 NOTE — Patient Instructions (Addendum)
 Follow up as needed or as scheduled We'll call you to schedule your podiatry and GYN appts Continue to ice your foot and wear supportive shoes Ibuprofen as needed for pain Call with any questions or concerns Hang in there!!!

## 2024-03-05 NOTE — Progress Notes (Signed)
   Subjective:    Patient ID: [name] [name], female    DOB: 08-24-1951, 73 y.o.   MRN: [id number]  HPI Foot pain- pt had xray done of R foot on 5/13 that didn't show any acute findings.  No relief w/ Voltaren .  Only having pain w/ bearing weight.  No pain when sitting.  Is hoping to see a podiatrist.  Took ibuprofen yesterday w/ some relief  Bumps on labia- pt reports sxs are bilateral.  No pain.  No drainage.  Pt is concerned but does not want to show me what she is referring to.   Review of Systems For ROS see HPI     Objective:   Physical Exam Vitals reviewed.  Constitutional:      General: She is not in acute distress.    Appearance: Normal appearance. She is not ill-appearing.  HENT:     Head: Normocephalic and atraumatic.  Cardiovascular:     Pulses: Normal pulses.  Genitourinary:    Comments: Deferred at pt's request Musculoskeletal:        General: Tenderness (TTP over metatarsal heads on R foot) present. No swelling.  Skin:    General: Skin is warm and dry.  Neurological:     General: No focal deficit present.     Mental Status: She is alert and oriented to person, place, and time.     Gait: Gait abnormal (antalgic gait due to R foot pain).           Assessment & Plan:  R foot pain- deteriorated.  Pain is worse today than it was when we saw her on 5/13.  At that time, xray was normal.  Suspect she has a fallen transverse arch and has metatarsal pain.  Will refer to podiatry at her request.  Labial lesion- new.  Pt refuses exam today and would like GYN referral.  I tried to use a skin atlas to compare what she has to possible dxs and it seems like she may have Fordyce spots- but again, w/o an exam, I can't say.  Referral placed.

## 2024-03-06 ENCOUNTER — Encounter: Payer: Self-pay | Admitting: Podiatry

## 2024-03-06 ENCOUNTER — Ambulatory Visit (INDEPENDENT_AMBULATORY_CARE_PROVIDER_SITE_OTHER): Admitting: Podiatry

## 2024-03-06 DIAGNOSIS — M79671 Pain in right foot: Secondary | ICD-10-CM

## 2024-03-06 DIAGNOSIS — M7751 Other enthesopathy of right foot: Secondary | ICD-10-CM

## 2024-03-06 NOTE — Progress Notes (Unsigned)
  Subjective:  Patient ID: [name] [name], female    DOB: 1951-03-26,  MRN: [id number]  Chief Complaint  Patient presents with   Foot Pain    RM#12 Right foot pain not any better since last appointment with pcp.    Discussed the use of AI scribe software for clinical note transcription with the patient, who gave verbal consent to proceed.  History of Present Illness [name] [name] is a 73 year old female who presents with right foot pain and swelling.  She experiences pain in the ball of her right foot, particularly affecting the second toe, for about a month. The pain is associated with inflammation and swelling and occurs primarily when walking, not when sitting or lying down. There is no history of injury, changes in activities, or new footwear. She denies numbness or tingling in the toes. The pain is consistent regardless of footwear, though she prefers older, more comfortable shoes. Voltaren  gel has been ineffective, but Advil provides some relief. She takes Advil in the morning with food. X-rays show no significant abnormalities or arthritis.      Objective:    Physical Exam General: AAO x3, NAD  Dermatological: Skin is warm, dry and supple bilateral.  There are no open sores, no preulcerative lesions, no rash or signs of infection present.  Vascular: Dorsalis Pedis artery and Posterior Tibial artery pedal pulses are 2/4 bilateral with immedate capillary fill time.  There is no pain with calf compression, swelling, warmth, erythema.   Neruologic: Grossly intact via light touch bilateral.   Musculoskeletal: Tenderness along the right second MTPJ with localized edema.  Is no erythema or warmth.  There is no area pinpoint tenderness noted otherwise.  Gait: Unassisted, Nonantalgic.     No images are attached to the encounter.    Results RADIOLOGY Foot X-ray: No there is no evidence of acute fracture noted today.  In particular on the second MPJ there is no significant arthritic  changes present.  Calcaneal spurring is present.   Assessment:   1. Capsulitis of metatarsophalangeal (MTP) joint of right foot   2. Right foot pain      Plan:  Patient was evaluated and treated and all questions answered.  Assessment and Plan Assessment & Plan Capsulitis of right foot Capsulitis localized to the metatarsophalangeal joint. X-ray negative for significant findings. Pain managed with ibuprofen. Discussed anti-inflammatories and pressure relief.  I offered a steroid injection. Emphasized pressure reduction on joint. - Continue ibuprofen with food as needed. - Apply ice and alternate with heat. - Use metatarsal pads which dispensed today. - Avoid barefoot; wear supportive shoes. - Consider steroid injection if persistent.   Return if symptoms worsen or fail to improve.   Charity Conch DPM

## 2024-03-14 ENCOUNTER — Ambulatory Visit: Admitting: Podiatry

## 2024-03-28 ENCOUNTER — Ambulatory Visit (INDEPENDENT_AMBULATORY_CARE_PROVIDER_SITE_OTHER): Admitting: Family Medicine

## 2024-03-28 ENCOUNTER — Encounter: Payer: Self-pay | Admitting: Family Medicine

## 2024-03-28 ENCOUNTER — Ambulatory Visit: Payer: Self-pay

## 2024-03-28 VITALS — BP 112/68 | HR 87 | Temp 97.8°F | Ht 60.0 in | Wt 117.0 lb

## 2024-03-28 DIAGNOSIS — S40262A Insect bite (nonvenomous) of left shoulder, initial encounter: Secondary | ICD-10-CM

## 2024-03-28 DIAGNOSIS — W57XXXA Bitten or stung by nonvenomous insect and other nonvenomous arthropods, initial encounter: Secondary | ICD-10-CM

## 2024-03-28 DIAGNOSIS — T7849XA Other allergy, initial encounter: Secondary | ICD-10-CM | POA: Diagnosis not present

## 2024-03-28 MED ORDER — TRIAMCINOLONE ACETONIDE 0.1 % EX OINT: 1.0000 | TOPICAL_OINTMENT | Freq: Two times a day (BID) | CUTANEOUS | 1 refills | Status: DC

## 2024-03-28 MED ORDER — DOXYCYCLINE HYCLATE 100 MG PO TABS: 100.0000 mg | ORAL_TABLET | Freq: Two times a day (BID) | ORAL | 0 refills | Status: DC

## 2024-03-28 NOTE — Patient Instructions (Signed)
 Follow up as needed or as scheduled If the bite develops worsening or spreading redness, increased pain or sensitivity to touch, you develop any fevers/rash/headache- start the Doxycycline USE the Triamcinolone ointment on the adhesive reaction- twice daily as needed Call with any questions or concerns Have a great summer!!!

## 2024-03-28 NOTE — Progress Notes (Unsigned)
   Subjective:    Patient ID: [name] [name], female    DOB: 04-30-1951, 73 y.o.   MRN: [id number]  HPI Tick bite- occurred yesterday, L shoulder.  Pt thinks tick had been embedded for ~8 hrs.  Tick was engorged.  Tick was removed w/ tweezers.  No pain.  Some redness of L shoulder.  No fevers.  No HA.   Review of Systems For ROS see HPI     Objective:   Physical Exam Vitals reviewed.  Constitutional:      General: She is not in acute distress.    Appearance: Normal appearance. She is not ill-appearing.  HENT:     Head: Normocephalic and atraumatic.  Lymphadenopathy:     Cervical: No cervical adenopathy.   Skin:    General: Skin is warm and dry.     Findings: Erythema (1cm area of induration on L shoulder near [facility] joint, well demarcated erythema following bandaid outline) present. No rash.   Neurological:     General: No focal deficit present.     Mental Status: She is alert and oriented to person, place, and time.   Psychiatric:        Mood and Affect: Mood normal.        Behavior: Behavior normal.        Thought Content: Thought content normal.           Assessment & Plan:  Tick bite L shoulder- new.  No evidence of infxn.  No systemic signs of tick borne illness- no fever, rash, HA, myalgias.  Given the small area of induration, will provide prescription for Doxycycline to start if sxs worsen over the weekend.  Reviewed supportive care and red flags that should prompt return.  Pt expressed understanding and is in agreement w/ plan.   Adhesive rxn- new.  Pt w/ clear erythema following outline of bandaid.  Start topical Triamcinolone ointment BID.  Reviewed supportive care and red flags that should prompt return.  Pt expressed understanding and is in agreement w/ plan.

## 2024-03-28 NOTE — Telephone Encounter (Signed)
 FYI Only or Action Required?: FYI only for provider  Patient was last seen in primary care on 03/05/2024 by Jess Morita, MD. Called Nurse Triage reporting Tick Removal. Symptoms began yesterday. Interventions attempted: Other: rubbing alcohol. Symptoms are: gradually worsening.  Triage Disposition: See Physician Within 24 Hours  Patient/caregiver understands and will follow disposition?: yes      Copied from CRM [id number]. Topic: Clinical - Red Word Triage >> Mar 28, 2024  9:11 AM Howard Macho wrote: Red Word that prompted transfer to Nurse Triage: patient called stating she was bit by a tick yesterday for several hours. Patient stated it is reddish Reason for Disposition  [1] Red or very tender (to touch) area AND [2] started over 24 hours after the bite  Answer Assessment - Initial Assessment Questions 1. ATTACHED:  Is the tick still on the skin?  (e.g., yes, no, unsure)     no 2. ONSET - TICK STILL ATTACHED:  How long do you think the tick has been on your skin? (e.g., hours, days, unsure)  Note:  Is there a recent activity (camping, hiking) where the caller may have been exposed?     *No Answer* 3. ONSET - TICK NOT STILL ATTACHED: If the tick has been removed, how long do you think the tick was attached before you removed it? (e.g., 5 hours, 2 days). When was this?     Several hours  4. LOCATION: Where is the tick bite located? (e.g., arm, leg)     Left shoulder  5. TYPE of TICK: Is it a wood tick or a deer tick? (e.g., deer tick, wood tick; unsure)     Unsure  6. SIZE of TICK: How big is the tick? (e.g., size of poppy seed, apple seed, watermelon seed; unsure) Note: Deer ticks can be the size of a poppy seed (nymph) or an apple seed (adult).       Unsure  7. ENGORGED: Did the tick look flat or engorged (full, swollen)? (e.g., flat, engorged; unsure)     yes 8. OTHER SYMPTOMS: Do you have any other symptoms? (e.g., fever, rash, redness at bite area, red ring  around bite)     Redness, swelling  Protocols used: Tick Bite-A-AH

## 2024-04-10 ENCOUNTER — Ambulatory Visit: Admitting: Psychiatry

## 2024-04-23 ENCOUNTER — Telehealth: Payer: Self-pay

## 2024-04-29 NOTE — Telephone Encounter (Signed)
 ERROR

## 2024-05-07 ENCOUNTER — Encounter: Payer: Self-pay | Admitting: Obstetrics and Gynecology

## 2024-05-07 ENCOUNTER — Ambulatory Visit (INDEPENDENT_AMBULATORY_CARE_PROVIDER_SITE_OTHER): Admitting: Obstetrics and Gynecology

## 2024-05-07 VITALS — BP 114/76 | HR 86

## 2024-05-07 DIAGNOSIS — L723 Sebaceous cyst: Secondary | ICD-10-CM

## 2024-05-07 NOTE — Progress Notes (Signed)
   Acute Office Visit  Subjective:    Patient ID: [name] [name], female    DOB: Sep 22, 1951, 73 y.o.   MRN: [id number]   HPI 73 y.o. presents today for Office Visit (Acute New GYN - lesion on labia. Has had it for longer than a year. Does not hurt or cause her any problems ) . MMG: declines Dxa: declines Colo: states UTD Denies any PMB   No LMP recorded. Patient is postmenopausal.    Review of Systems     Objective:    OBGyn Exam  BP 114/76 (BP Location: Left Arm, Patient Position: Sitting)   Pulse 86   SpO2 95%  Wt Readings from Last 3 Encounters:  03/28/24 117 lb (53.1 kg)  03/05/24 118 lb (53.5 kg)  02/25/24 119 lb (54 kg)        Exam with [name] [name] CMA in room Left sebaceous cyst. Patient counseled on benign nature and desires expression today.  Waxy material released Right upper groin with non infected in grown hair.   Assessment & Plan:  Sebaceous cyst: patient instructed on using a warm wash cloth with antibacterial soap and to continue with expression at home.  Counseled on benign nature. May do the warm wash cloth with the ingrown hair as well. Counseled on importance of MMG and dxa scan and declines. RTC for annual exams and with any concerns  Dr. Glennon Almarie MARLA Glennon

## 2024-06-02 ENCOUNTER — Encounter: Payer: Self-pay | Admitting: Family Medicine

## 2024-06-02 ENCOUNTER — Ambulatory Visit (INDEPENDENT_AMBULATORY_CARE_PROVIDER_SITE_OTHER): Payer: Medicare Other | Admitting: Family Medicine

## 2024-06-02 VITALS — BP 104/58 | HR 77 | Temp 97.9°F | Ht 60.0 in | Wt 112.5 lb

## 2024-06-02 DIAGNOSIS — E1169 Type 2 diabetes mellitus with other specified complication: Secondary | ICD-10-CM

## 2024-06-02 DIAGNOSIS — E559 Vitamin D deficiency, unspecified: Secondary | ICD-10-CM

## 2024-06-02 DIAGNOSIS — E1165 Type 2 diabetes mellitus with hyperglycemia: Secondary | ICD-10-CM | POA: Diagnosis not present

## 2024-06-02 DIAGNOSIS — E785 Hyperlipidemia, unspecified: Secondary | ICD-10-CM | POA: Diagnosis not present

## 2024-06-02 LAB — HEPATIC FUNCTION PANEL
ALT: 25 U/L (ref 0–35)
AST: 25 U/L (ref 0–37)
Albumin: 4.2 g/dL (ref 3.5–5.2)
Alkaline Phosphatase: 69 U/L (ref 39–117)
Bilirubin, Direct: 0.1 mg/dL (ref 0.0–0.3)
Total Bilirubin: 0.7 mg/dL (ref 0.2–1.2)
Total Protein: 7.1 g/dL (ref 6.0–8.3)

## 2024-06-02 LAB — LIPID PANEL
Cholesterol: 250 mg/dL — ABNORMAL HIGH (ref 0–200)
HDL: 43.5 mg/dL (ref >39.00)
LDL Cholesterol: 184 mg/dL — ABNORMAL HIGH (ref 0–99)
NonHDL: 206.88
Total CHOL/HDL Ratio: 6
Triglycerides: 115 mg/dL (ref 0.0–149.0)
VLDL: 23 mg/dL (ref 0.0–40.0)

## 2024-06-02 LAB — BASIC METABOLIC PANEL WITH GFR
BUN: 16 mg/dL (ref 6–23)
CO2: 30 meq/L (ref 19–32)
Calcium: 9.8 mg/dL (ref 8.4–10.5)
Chloride: 100 meq/L (ref 96–112)
Creatinine, Ser: 0.9 mg/dL (ref 0.40–1.20)
GFR: 63.47 mL/min (ref >60.00)
Glucose, Bld: 139 mg/dL — ABNORMAL HIGH (ref 70–99)
Potassium: 4.7 meq/L (ref 3.5–5.1)
Sodium: 138 meq/L (ref 135–145)

## 2024-06-02 LAB — MICROALBUMIN / CREATININE URINE RATIO
Creatinine,U: 126 mg/dL
Microalb Creat Ratio: 6.4 mg/g (ref 0.0–30.0)
Microalb, Ur: 0.8 mg/dL (ref 0.0–1.9)

## 2024-06-02 LAB — B12 AND FOLATE PANEL
Folate: 13.5 ng/mL (ref >5.9)
Vitamin B-12: >1500 pg/mL — ABNORMAL HIGH (ref 211–911)

## 2024-06-02 LAB — VITAMIN D 25 HYDROXY (VIT D DEFICIENCY, FRACTURES): VITD: 55.73 ng/mL (ref 30.00–100.00)

## 2024-06-02 LAB — HEMOGLOBIN A1C: Hgb A1c MFr Bld: 6 % (ref 4.6–6.5)

## 2024-06-02 NOTE — Assessment & Plan Note (Signed)
 Chronic problem.  Last LDL 132 (goal <70) and was prescribed Crestor .  Pt is not taking medication.  Check labs and again start meds if above goal

## 2024-06-02 NOTE — Assessment & Plan Note (Signed)
 Chronic problem.  Has been controlled w/ diet and exercise.  Pt is down another 5 lbs w/ intermittent fasting.  UTD on eye exam, foot exam.  Will get microalbumin and A1C.  Will continue to monitor and start meds prn.

## 2024-06-02 NOTE — Patient Instructions (Signed)
 Follow up in 6 months to recheck sugar, cholesterol We'll notify you of your lab results and make any changes if needed Keep up the good work on healthy diet and regular exercise- you look great! Call with any questions or concerns Stay Safe!  Stay Healthy! Enjoy the rest of your summer!!!

## 2024-06-02 NOTE — Assessment & Plan Note (Signed)
 Pt has hx of both low and high Vit D.  Check labs and address if needed

## 2024-06-02 NOTE — Progress Notes (Signed)
   Subjective:    Patient ID: Nyeli Holtmeyer, female    DOB: May 27, 1951, 73 y.o.   MRN: [id number]  HPI DM- last A1C 6.2%  Attempting to control w/ diet and exercise.  Is down 5 lbs since last visit.  Pt is doing intermittent fasting.  UTD on eye exam, foot exam.  Will get A1C and microalbumin today.  No CP, SOB, HA's, visual changes.  No numbness/tingling of hands/feet.  Hyperlipidemia- last LDL 132.  She was prescribed Crestor  but not taking it.  No abd pain, N/V.  Vit D deficiency- pt has had low Vit D and also high Vit D.  Wants to know what current value is.   Review of Systems For ROS see HPI     Objective:   Physical Exam Vitals reviewed.  Constitutional:      General: She is not in acute distress.    Appearance: Normal appearance. She is well-developed. She is not ill-appearing.  HENT:     Head: Normocephalic and atraumatic.  Eyes:     Conjunctiva/sclera: Conjunctivae normal.     Pupils: Pupils are equal, round, and reactive to light.  Neck:     Thyroid : No thyromegaly.  Cardiovascular:     Rate and Rhythm: Normal rate and regular rhythm.     Pulses: Normal pulses.     Heart sounds: Normal heart sounds. No murmur heard. Pulmonary:     Effort: Pulmonary effort is normal. No respiratory distress.     Breath sounds: Normal breath sounds.  Abdominal:     General: There is no distension.     Palpations: Abdomen is soft.     Tenderness: There is no abdominal tenderness.  Musculoskeletal:     Cervical back: Normal range of motion and neck supple.     Right lower leg: No edema.     Left lower leg: No edema.  Lymphadenopathy:     Cervical: No cervical adenopathy.  Skin:    General: Skin is warm and dry.  Neurological:     General: No focal deficit present.     Mental Status: She is alert and oriented to person, place, and time.  Psychiatric:        Mood and Affect: Mood normal.        Behavior: Behavior normal.           Assessment & Plan:

## 2024-06-03 ENCOUNTER — Ambulatory Visit: Payer: Self-pay | Admitting: Family Medicine

## 2024-06-03 MED ORDER — ROSUVASTATIN CALCIUM 20 MG PO TABS: 20.0000 mg | ORAL_TABLET | Freq: Every day | ORAL | 1 refills | Status: AC

## 2024-06-03 NOTE — Telephone Encounter (Signed)
-----   Message from Comer Greet sent at 06/03/2024  7:27 AM EDT ----- Labs look good w/ exception of total cholesterol and LDL (bad cholesterol) which are well above goal.  Based on this, we need to start Crestor  20mg  nightly (#90, 1 refill) to get these numbers back  in range and reduce your risk of heart attack and stroke. ----- Message ----- From: Interface, Lab In Three Zero One Sent: 06/02/2024   2:05 PM EDT To: Comer FORBES Greet, MD

## 2024-06-03 NOTE — Telephone Encounter (Signed)
 Unable to leave vm for patient to return call.  I sent Crestor  20mg  to patients pharmacy [name]) just in case she calls back.

## 2024-06-03 NOTE — Progress Notes (Signed)
 Patient read lab results via MyChart

## 2024-06-24 ENCOUNTER — Encounter (INDEPENDENT_AMBULATORY_CARE_PROVIDER_SITE_OTHER): Admitting: Family Medicine

## 2024-06-24 ENCOUNTER — Encounter: Payer: Self-pay | Admitting: Family Medicine

## 2024-06-24 NOTE — Progress Notes (Unsigned)
 Patient was unable to self-report due to a lack of equipment at home via telehealth

## 2024-06-24 NOTE — Progress Notes (Unsigned)
 error

## 2024-10-30 ENCOUNTER — Ambulatory Visit: Admitting: Family Medicine

## 2024-10-30 ENCOUNTER — Encounter: Payer: Self-pay | Admitting: Family Medicine

## 2024-10-30 VITALS — BP 128/78 | HR 80 | Ht 60.0 in | Wt 119.2 lb

## 2024-10-30 DIAGNOSIS — L989 Disorder of the skin and subcutaneous tissue, unspecified: Secondary | ICD-10-CM

## 2024-10-30 MED ORDER — CLOTRIMAZOLE-BETAMETHASONE 1-0.05 % EX CREA: 1.0000 | TOPICAL_CREAM | Freq: Two times a day (BID) | CUTANEOUS | 1 refills | Status: AC

## 2024-10-30 NOTE — Progress Notes (Signed)
" ° °  Subjective:    Patient ID: Shahira Fiske, female    DOB: 1951/02/08, 74 y.o.   MRN: [id number]  HPI Spot on leg- R lower leg on calf.  Red, dry.  Slowly enlarging over the last year.  No itching, no pain. + pets.   Review of Systems For ROS see HPI     Objective:   Physical Exam Vitals reviewed.  Constitutional:      General: She is not in acute distress.    Appearance: Normal appearance. She is not ill-appearing.  HENT:     Head: Normocephalic and atraumatic.  Skin:    General: Skin is warm and dry.     Findings: Lesion (well circumscribed lesion of R posterior calf w/ scaling and erythema) present.  Neurological:     General: No focal deficit present.     Mental Status: She is alert and oriented to person, place, and time.  Psychiatric:        Mood and Affect: Mood normal.        Behavior: Behavior normal.        Thought Content: Thought content normal.           Assessment & Plan:  Skin lesion- new.  Well circumscribed, scaling erythematous lesion of R calf consistent w/ either ringworm or eczema.  Start topical Lotrisone  cream BID to treat either cause.  Reassurance provided that this was a benign process.  Pt expressed understanding and is in agreement w/ plan.  "

## 2024-10-30 NOTE — Patient Instructions (Signed)
 Follow up as needed or as scheduled APPLY the Lotrisone  cream twice daily- but it can take awhile to improve Call with any questions or concerns Stay Safe!  Stay Healthy! Happy New Year!
